# Patient Record
Sex: Female | Born: 1965 | Race: White | Hispanic: No | Marital: Married | Smoking: Never smoker
Health system: Southern US, Community
[De-identification: ages and names within clinical notes are randomized; demographics above are authoritative.]

## PROBLEM LIST (undated history)

## (undated) DIAGNOSIS — A419 Sepsis, unspecified organism: Secondary | ICD-10-CM

## (undated) DIAGNOSIS — Z8619 Personal history of other infectious and parasitic diseases: Secondary | ICD-10-CM

## (undated) DIAGNOSIS — I10 Essential (primary) hypertension: Secondary | ICD-10-CM

## (undated) DIAGNOSIS — N390 Urinary tract infection, site not specified: Secondary | ICD-10-CM

## (undated) DIAGNOSIS — E785 Hyperlipidemia, unspecified: Secondary | ICD-10-CM

## (undated) DIAGNOSIS — R600 Localized edema: Secondary | ICD-10-CM

## (undated) DIAGNOSIS — Z973 Presence of spectacles and contact lenses: Secondary | ICD-10-CM

## (undated) DIAGNOSIS — K3184 Gastroparesis: Secondary | ICD-10-CM

## (undated) DIAGNOSIS — N179 Acute kidney failure, unspecified: Secondary | ICD-10-CM

## (undated) DIAGNOSIS — N950 Postmenopausal bleeding: Secondary | ICD-10-CM

## (undated) DIAGNOSIS — R51 Headache: Secondary | ICD-10-CM

## (undated) DIAGNOSIS — F419 Anxiety disorder, unspecified: Secondary | ICD-10-CM

## (undated) DIAGNOSIS — IMO0001 Reserved for inherently not codable concepts without codable children: Secondary | ICD-10-CM

## (undated) DIAGNOSIS — N183 Chronic kidney disease, stage 3 unspecified: Secondary | ICD-10-CM

## (undated) DIAGNOSIS — N200 Calculus of kidney: Secondary | ICD-10-CM

## (undated) DIAGNOSIS — E119 Type 2 diabetes mellitus without complications: Secondary | ICD-10-CM

## (undated) DIAGNOSIS — F32A Depression, unspecified: Secondary | ICD-10-CM

## (undated) DIAGNOSIS — F329 Major depressive disorder, single episode, unspecified: Secondary | ICD-10-CM

## (undated) DIAGNOSIS — K589 Irritable bowel syndrome without diarrhea: Secondary | ICD-10-CM

## (undated) DIAGNOSIS — Z87442 Personal history of urinary calculi: Secondary | ICD-10-CM

## (undated) DIAGNOSIS — M797 Fibromyalgia: Secondary | ICD-10-CM

## (undated) DIAGNOSIS — M1990 Unspecified osteoarthritis, unspecified site: Secondary | ICD-10-CM

## (undated) DIAGNOSIS — N261 Atrophy of kidney (terminal): Secondary | ICD-10-CM

## (undated) DIAGNOSIS — J45998 Other asthma: Secondary | ICD-10-CM

## (undated) DIAGNOSIS — R519 Headache, unspecified: Secondary | ICD-10-CM

## (undated) DIAGNOSIS — D509 Iron deficiency anemia, unspecified: Secondary | ICD-10-CM

## (undated) DIAGNOSIS — R6520 Severe sepsis without septic shock: Secondary | ICD-10-CM

## (undated) HISTORY — PX: EYE SURGERY: SHX253

## (undated) HISTORY — PX: LAPAROSCOPIC CHOLECYSTECTOMY: SUR755

## (undated) HISTORY — PX: SPINAL CORD STIMULATOR IMPLANT: SHX2422

## (undated) HISTORY — PX: APPENDECTOMY: SHX54

---

## 1998-03-27 ENCOUNTER — Other Ambulatory Visit: Admission: RE | Admit: 1998-03-27 | Discharge: 1998-03-27 | Payer: Self-pay | Admitting: Gynecology

## 1999-03-13 ENCOUNTER — Emergency Department (HOSPITAL_COMMUNITY): Admission: EM | Admit: 1999-03-13 | Discharge: 1999-03-13 | Payer: Self-pay

## 1999-04-13 ENCOUNTER — Other Ambulatory Visit: Admission: RE | Admit: 1999-04-13 | Discharge: 1999-04-13 | Payer: Self-pay | Admitting: Gynecology

## 2000-03-28 ENCOUNTER — Other Ambulatory Visit: Admission: RE | Admit: 2000-03-28 | Discharge: 2000-03-28 | Payer: Self-pay | Admitting: Gynecology

## 2000-05-01 ENCOUNTER — Emergency Department (HOSPITAL_COMMUNITY): Admission: EM | Admit: 2000-05-01 | Discharge: 2000-05-01 | Payer: Self-pay | Admitting: Emergency Medicine

## 2000-05-02 ENCOUNTER — Encounter: Payer: Self-pay | Admitting: Emergency Medicine

## 2001-03-15 ENCOUNTER — Encounter: Admission: RE | Admit: 2001-03-15 | Discharge: 2001-06-13 | Payer: Self-pay | Admitting: Ophthalmology

## 2001-04-14 ENCOUNTER — Other Ambulatory Visit: Admission: RE | Admit: 2001-04-14 | Discharge: 2001-04-14 | Payer: Self-pay | Admitting: Gynecology

## 2002-05-30 ENCOUNTER — Emergency Department (HOSPITAL_COMMUNITY): Admission: EM | Admit: 2002-05-30 | Discharge: 2002-05-30 | Payer: Self-pay | Admitting: Emergency Medicine

## 2002-06-02 ENCOUNTER — Emergency Department (HOSPITAL_COMMUNITY): Admission: EM | Admit: 2002-06-02 | Discharge: 2002-06-02 | Payer: Self-pay | Admitting: Emergency Medicine

## 2002-06-06 ENCOUNTER — Emergency Department (HOSPITAL_COMMUNITY): Admission: EM | Admit: 2002-06-06 | Discharge: 2002-06-06 | Payer: Self-pay | Admitting: *Deleted

## 2002-06-13 ENCOUNTER — Emergency Department (HOSPITAL_COMMUNITY): Admission: EM | Admit: 2002-06-13 | Discharge: 2002-06-13 | Payer: Self-pay | Admitting: Emergency Medicine

## 2002-06-20 ENCOUNTER — Emergency Department (HOSPITAL_COMMUNITY): Admission: EM | Admit: 2002-06-20 | Discharge: 2002-06-20 | Payer: Self-pay | Admitting: Emergency Medicine

## 2002-07-16 ENCOUNTER — Other Ambulatory Visit: Admission: RE | Admit: 2002-07-16 | Discharge: 2002-07-16 | Payer: Self-pay | Admitting: Gynecology

## 2003-07-19 ENCOUNTER — Other Ambulatory Visit: Admission: RE | Admit: 2003-07-19 | Discharge: 2003-07-19 | Payer: Self-pay | Admitting: Gynecology

## 2004-03-06 ENCOUNTER — Encounter: Admission: RE | Admit: 2004-03-06 | Discharge: 2004-03-06 | Payer: Self-pay | Admitting: Gynecology

## 2004-08-17 ENCOUNTER — Other Ambulatory Visit: Admission: RE | Admit: 2004-08-17 | Discharge: 2004-08-17 | Payer: Self-pay | Admitting: Gynecology

## 2005-06-01 ENCOUNTER — Encounter: Admission: RE | Admit: 2005-06-01 | Discharge: 2005-07-09 | Payer: Self-pay | Admitting: Family Medicine

## 2006-01-25 ENCOUNTER — Encounter: Admission: RE | Admit: 2006-01-25 | Discharge: 2006-01-25 | Payer: Self-pay | Admitting: Gynecology

## 2006-01-25 ENCOUNTER — Other Ambulatory Visit: Admission: RE | Admit: 2006-01-25 | Discharge: 2006-01-25 | Payer: Self-pay | Admitting: Gynecology

## 2006-08-24 ENCOUNTER — Ambulatory Visit (HOSPITAL_COMMUNITY): Admission: RE | Admit: 2006-08-24 | Discharge: 2006-08-24 | Payer: Self-pay | Admitting: Gynecology

## 2007-04-05 ENCOUNTER — Encounter: Admission: RE | Admit: 2007-04-05 | Discharge: 2007-04-05 | Payer: Self-pay | Admitting: *Deleted

## 2007-10-31 ENCOUNTER — Emergency Department (HOSPITAL_COMMUNITY): Admission: EM | Admit: 2007-10-31 | Discharge: 2007-10-31 | Payer: Self-pay | Admitting: Emergency Medicine

## 2007-11-13 ENCOUNTER — Encounter
Admission: RE | Admit: 2007-11-13 | Discharge: 2007-11-13 | Payer: Self-pay | Admitting: Physical Medicine and Rehabilitation

## 2007-11-14 ENCOUNTER — Encounter
Admission: RE | Admit: 2007-11-14 | Discharge: 2007-11-14 | Payer: Self-pay | Admitting: Physical Medicine and Rehabilitation

## 2007-11-17 ENCOUNTER — Encounter
Admission: RE | Admit: 2007-11-17 | Discharge: 2007-11-17 | Payer: Self-pay | Admitting: Physical Medicine and Rehabilitation

## 2007-12-07 HISTORY — PX: COCCYX REMOVAL: SHX600

## 2008-01-25 ENCOUNTER — Encounter: Admission: RE | Admit: 2008-01-25 | Discharge: 2008-04-24 | Payer: Self-pay | Admitting: Anesthesiology

## 2008-04-08 ENCOUNTER — Encounter: Admission: RE | Admit: 2008-04-08 | Discharge: 2008-04-08 | Payer: Self-pay | Admitting: Family Medicine

## 2009-04-11 ENCOUNTER — Encounter: Admission: RE | Admit: 2009-04-11 | Discharge: 2009-04-11 | Payer: Self-pay | Admitting: Family Medicine

## 2009-06-02 ENCOUNTER — Emergency Department (HOSPITAL_BASED_OUTPATIENT_CLINIC_OR_DEPARTMENT_OTHER): Admission: EM | Admit: 2009-06-02 | Discharge: 2009-06-02 | Payer: Self-pay | Admitting: Emergency Medicine

## 2009-06-02 ENCOUNTER — Ambulatory Visit: Payer: Self-pay | Admitting: Diagnostic Radiology

## 2011-02-24 ENCOUNTER — Other Ambulatory Visit: Payer: Self-pay | Admitting: Gynecology

## 2011-02-24 DIAGNOSIS — R928 Other abnormal and inconclusive findings on diagnostic imaging of breast: Secondary | ICD-10-CM

## 2011-02-26 ENCOUNTER — Other Ambulatory Visit: Payer: Self-pay | Admitting: Gynecology

## 2011-02-26 ENCOUNTER — Ambulatory Visit
Admission: RE | Admit: 2011-02-26 | Discharge: 2011-02-26 | Disposition: A | Discharge status: Home / Self Care | Payer: BC Managed Care – PPO | Source: Ambulatory Visit | Attending: Gynecology | Admitting: Gynecology

## 2011-02-26 DIAGNOSIS — R928 Other abnormal and inconclusive findings on diagnostic imaging of breast: Secondary | ICD-10-CM

## 2011-03-15 LAB — COMPREHENSIVE METABOLIC PANEL
AST: 21 U/L (ref 0–37)
Albumin: 3.6 g/dL (ref 3.5–5.2)
Alkaline Phosphatase: 123 U/L — ABNORMAL HIGH (ref 39–117)
BUN: 4 mg/dL — ABNORMAL LOW (ref 6–23)
CO2: 28 mEq/L (ref 19–32)
Calcium: 9.1 mg/dL (ref 8.4–10.5)
Creatinine, Ser: 0.5 mg/dL (ref 0.4–1.2)
GFR calc non Af Amer: >60 mL/min (ref >60)
Glucose, Bld: 175 mg/dL — ABNORMAL HIGH (ref 70–99)
Total Protein: 7 g/dL (ref 6.0–8.3)

## 2011-03-15 LAB — DIFFERENTIAL
Basophils Absolute: 0.2 10*3/uL — ABNORMAL HIGH (ref 0.0–0.1)
Basophils Relative: 1 % (ref 0–1)
Eosinophils Relative: 0 % (ref 0–5)
Lymphocytes Relative: 10 % — ABNORMAL LOW (ref 12–46)
Lymphs Abs: 1.7 10*3/uL (ref 0.7–4.0)
Monocytes Relative: 9 % (ref 3–12)
Neutro Abs: 13.4 10*3/uL — ABNORMAL HIGH (ref 1.7–7.7)
Neutrophils Relative %: 80 % — ABNORMAL HIGH (ref 43–77)
WBC Morphology: INCREASED

## 2011-03-15 LAB — URINALYSIS, ROUTINE W REFLEX MICROSCOPIC
Glucose, UA: NEGATIVE mg/dL
Ketones, ur: NEGATIVE mg/dL
Nitrite: POSITIVE — AB
Protein, ur: 30 mg/dL — AB
Urobilinogen, UA: 0.2 mg/dL (ref 0.0–1.0)
pH: 5 (ref 5.0–8.0)

## 2011-03-15 LAB — URINE MICROSCOPIC-ADD ON

## 2011-03-15 LAB — CBC: Hemoglobin: 12 g/dL (ref 12.0–15.0)

## 2011-09-14 LAB — POCT CARDIAC MARKERS
CKMB, poc: 2
CKMB, poc: <1 — ABNORMAL LOW
Myoglobin, poc: 40.9
Troponin i, poc: <0.05
Troponin i, poc: <0.05

## 2011-09-14 LAB — I-STAT 8, (EC8 V) (CONVERTED LAB)
BUN: 12
Hemoglobin: 14.6
Potassium: 4
TCO2: 31
pH, Ven: 7.364 — ABNORMAL HIGH

## 2011-09-14 LAB — CBC
Hemoglobin: 13.6
MCV: 81.8

## 2011-09-14 LAB — B-NATRIURETIC PEPTIDE (CONVERTED LAB): Pro B Natriuretic peptide (BNP): <30

## 2011-09-14 LAB — DIFFERENTIAL
Basophils Absolute: 0.1
Lymphs Abs: 2.8
Monocytes Relative: 7

## 2011-09-14 LAB — POCT I-STAT CREATININE
Creatinine, Ser: 0.6
Operator id: 198171

## 2013-01-08 DIAGNOSIS — M5417 Radiculopathy, lumbosacral region: Secondary | ICD-10-CM | POA: Insufficient documentation

## 2013-01-08 DIAGNOSIS — M533 Sacrococcygeal disorders, not elsewhere classified: Secondary | ICD-10-CM | POA: Insufficient documentation

## 2013-04-06 ENCOUNTER — Encounter (HOSPITAL_COMMUNITY): Payer: Self-pay | Admitting: Pharmacist

## 2013-04-16 ENCOUNTER — Encounter (HOSPITAL_COMMUNITY)
Admission: RE | Admit: 2013-04-16 | Discharge: 2013-04-16 | Disposition: A | Discharge status: Home / Self Care | Payer: Medicare Other | Source: Ambulatory Visit | Attending: Obstetrics and Gynecology | Admitting: Obstetrics and Gynecology

## 2013-04-16 ENCOUNTER — Encounter (HOSPITAL_COMMUNITY): Payer: Self-pay

## 2013-04-16 HISTORY — DX: Anxiety disorder, unspecified: F41.9

## 2013-04-16 HISTORY — DX: Fibromyalgia: M79.7

## 2013-04-16 HISTORY — DX: Essential (primary) hypertension: I10

## 2013-04-16 HISTORY — DX: Depression, unspecified: F32.A

## 2013-04-16 HISTORY — DX: Major depressive disorder, single episode, unspecified: F32.9

## 2013-04-16 LAB — CBC
HCT: 38.8 % (ref 36.0–46.0)
Hemoglobin: 12.7 g/dL (ref 12.0–15.0)
MCH: 26.8 pg (ref 26.0–34.0)
MCHC: 32.7 g/dL (ref 30.0–36.0)
MCV: 82 fL (ref 78.0–100.0)
RBC: 4.73 MIL/uL (ref 3.87–5.11)

## 2013-04-16 LAB — BASIC METABOLIC PANEL
BUN: 10 mg/dL (ref 6–23)
CO2: 34 mEq/L — ABNORMAL HIGH (ref 19–32)
Chloride: 95 mEq/L — ABNORMAL LOW (ref 96–112)
GFR calc non Af Amer: >90 mL/min (ref >90)
Glucose, Bld: 108 mg/dL — ABNORMAL HIGH (ref 70–99)
Potassium: 3.2 mEq/L — ABNORMAL LOW (ref 3.5–5.1)
Sodium: 139 mEq/L (ref 135–145)

## 2013-04-16 NOTE — H&P (Addendum)
47 yo with irregular menses and endometrial polyp presents for surgical mngt  PMHx: arthritis, HTN, DM, asthma, HA, IBS PSx:  Chole, open appy, tailbone sx Meds:  See list All:  None FHX:  Mom - breast, brother brain ca SHx:  Negative  Af, vss Gen - NAD CV - RRR Lungs - clear Abd - soft ND/NT PV - uterus soft, NT, no adnexal masses  [place]:  Hydrosalpinx,  Fibroids, 12mm intracavitary mass  A/P:  Irregular meses & endometrial mass Hysteroscopy, D&C Plan of care discussed, informed consent

## 2013-04-16 NOTE — Patient Instructions (Addendum)
Your procedure is scheduled on:04/20/13  Enter through the Main Entrance at :6am Pick up desk phone and dial [phone] and inform us of your arrival.  Please call [phone] if you have any problems the morning of surgery.  Remember: Do not eat or drink after midnight:Thursday   Take these meds the morning of surgery with a sip of water:BP meds  DO NOT wear jewelry, eye make-up, lipstick,body lotion, or dark fingernail polish.  Patients discharged on the day of surgery will not be allowed to drive home.

## 2013-04-19 MED ORDER — CEFOTETAN DISODIUM 2 G IJ SOLR
2.0000 g | INTRAMUSCULAR | Status: AC
  Administered 2013-04-20: 2 g via INTRAVENOUS
  Filled 2013-04-19: qty 2

## 2013-04-20 ENCOUNTER — Encounter (HOSPITAL_COMMUNITY)
Admission: RE | Disposition: A | Discharge status: Home / Self Care | Payer: Self-pay | Source: Ambulatory Visit | Attending: Obstetrics and Gynecology

## 2013-04-20 ENCOUNTER — Encounter (HOSPITAL_COMMUNITY): Payer: Self-pay | Admitting: Anesthesiology

## 2013-04-20 ENCOUNTER — Ambulatory Visit (HOSPITAL_COMMUNITY)
Admission: RE | Admit: 2013-04-20 | Discharge: 2013-04-20 | Disposition: A | Discharge status: Home / Self Care | Payer: Medicare Other | Source: Ambulatory Visit | Attending: Obstetrics and Gynecology | Admitting: Obstetrics and Gynecology

## 2013-04-20 ENCOUNTER — Encounter (HOSPITAL_COMMUNITY): Payer: Self-pay

## 2013-04-20 ENCOUNTER — Ambulatory Visit (HOSPITAL_COMMUNITY): Payer: Medicare Other

## 2013-04-20 DIAGNOSIS — N7013 Chronic salpingitis and oophoritis: Secondary | ICD-10-CM | POA: Insufficient documentation

## 2013-04-20 DIAGNOSIS — N926 Irregular menstruation, unspecified: Secondary | ICD-10-CM | POA: Insufficient documentation

## 2013-04-20 DIAGNOSIS — N840 Polyp of corpus uteri: Secondary | ICD-10-CM | POA: Insufficient documentation

## 2013-04-20 DIAGNOSIS — E119 Type 2 diabetes mellitus without complications: Secondary | ICD-10-CM | POA: Insufficient documentation

## 2013-04-20 DIAGNOSIS — I10 Essential (primary) hypertension: Secondary | ICD-10-CM | POA: Insufficient documentation

## 2013-04-20 HISTORY — PX: HYSTEROSCOPY WITH D & C: SHX1775

## 2013-04-20 HISTORY — PX: POLYPECTOMY: SHX5525

## 2013-04-20 LAB — GLUCOSE, CAPILLARY: Glucose-Capillary: 153 mg/dL — ABNORMAL HIGH (ref 70–99)

## 2013-04-20 SURGERY — DILATATION AND CURETTAGE /HYSTEROSCOPY
Anesthesia: General | Site: Vagina | Wound class: Clean Contaminated

## 2013-04-20 MED ORDER — PROPOFOL 10 MG/ML IV EMUL
INTRAVENOUS | Status: AC
  Filled 2013-04-20: qty 20

## 2013-04-20 MED ORDER — FENTANYL CITRATE 0.05 MG/ML IJ SOLN
INTRAMUSCULAR | Status: DC | PRN
  Administered 2013-04-20: 100 ug via INTRAVENOUS
  Administered 2013-04-20 (×2): 50 ug via INTRAVENOUS

## 2013-04-20 MED ORDER — MIDAZOLAM HCL 2 MG/2ML IJ SOLN
INTRAMUSCULAR | Status: AC
  Filled 2013-04-20: qty 2

## 2013-04-20 MED ORDER — LACTATED RINGERS IV SOLN
INTRAVENOUS | Status: DC
  Administered 2013-04-20: 50 mL/h via INTRAVENOUS

## 2013-04-20 MED ORDER — FENTANYL CITRATE 0.05 MG/ML IJ SOLN
INTRAMUSCULAR | Status: AC
  Administered 2013-04-20: 50 ug via INTRAVENOUS
  Filled 2013-04-20: qty 2

## 2013-04-20 MED ORDER — FENTANYL CITRATE 0.05 MG/ML IJ SOLN
INTRAMUSCULAR | Status: AC
  Filled 2013-04-20: qty 2

## 2013-04-20 MED ORDER — LIDOCAINE HCL 1 % IJ SOLN
INTRAMUSCULAR | Status: DC | PRN
  Administered 2013-04-20: 8 mL

## 2013-04-20 MED ORDER — PROPOFOL 10 MG/ML IV BOLUS
INTRAVENOUS | Status: DC | PRN
  Administered 2013-04-20: 40 mg via INTRAVENOUS
  Administered 2013-04-20: 250 mg via INTRAVENOUS

## 2013-04-20 MED ORDER — ONDANSETRON HCL 4 MG/2ML IJ SOLN
INTRAMUSCULAR | Status: AC
  Filled 2013-04-20: qty 2

## 2013-04-20 MED ORDER — MIDAZOLAM HCL 5 MG/5ML IJ SOLN
INTRAMUSCULAR | Status: DC | PRN
  Administered 2013-04-20: 2 mg via INTRAVENOUS

## 2013-04-20 MED ORDER — KETOROLAC TROMETHAMINE 30 MG/ML IJ SOLN
INTRAMUSCULAR | Status: AC
  Filled 2013-04-20: qty 1

## 2013-04-20 MED ORDER — FENTANYL CITRATE 0.05 MG/ML IJ SOLN
25.0000 ug | INTRAMUSCULAR | Status: DC | PRN
  Administered 2013-04-20 (×2): 50 ug via INTRAVENOUS

## 2013-04-20 MED ORDER — GLYCOPYRROLATE 0.2 MG/ML IJ SOLN
INTRAMUSCULAR | Status: AC
  Filled 2013-04-20: qty 1

## 2013-04-20 MED ORDER — ONDANSETRON HCL 4 MG/2ML IJ SOLN
INTRAMUSCULAR | Status: DC | PRN
  Administered 2013-04-20: 4 mg via INTRAVENOUS

## 2013-04-20 MED ORDER — LIDOCAINE HCL (CARDIAC) 20 MG/ML IV SOLN
INTRAVENOUS | Status: AC
  Filled 2013-04-20: qty 5

## 2013-04-20 MED ORDER — GLYCOPYRROLATE 0.2 MG/ML IJ SOLN
INTRAMUSCULAR | Status: DC | PRN
  Administered 2013-04-20: 0.1 mg via INTRAVENOUS

## 2013-04-20 MED ORDER — DEXAMETHASONE SODIUM PHOSPHATE 4 MG/ML IJ SOLN
INTRAMUSCULAR | Status: DC | PRN
  Administered 2013-04-20: 10 mg via INTRAVENOUS

## 2013-04-20 MED ORDER — DEXAMETHASONE SODIUM PHOSPHATE 10 MG/ML IJ SOLN
INTRAMUSCULAR | Status: AC
  Filled 2013-04-20: qty 1

## 2013-04-20 MED ORDER — LIDOCAINE HCL (CARDIAC) 20 MG/ML IV SOLN
INTRAVENOUS | Status: DC | PRN
  Administered 2013-04-20: 80 mg via INTRAVENOUS

## 2013-04-20 MED ORDER — GLYCINE 1.5 % IR SOLN
Status: DC | PRN
  Administered 2013-04-20: 3000 mL

## 2013-04-20 MED ORDER — ACETAMINOPHEN 10 MG/ML IV SOLN
INTRAVENOUS | Status: AC
  Administered 2013-04-20: 1000 mg via INTRAVENOUS
  Filled 2013-04-20: qty 100

## 2013-04-20 MED ORDER — ALBUTEROL SULFATE HFA 108 (90 BASE) MCG/ACT IN AERS
INHALATION_SPRAY | RESPIRATORY_TRACT | Status: AC
  Filled 2013-04-20: qty 6.7

## 2013-04-20 MED ORDER — KETOROLAC TROMETHAMINE 30 MG/ML IJ SOLN
INTRAMUSCULAR | Status: DC | PRN
  Administered 2013-04-20: 30 mg via INTRAVENOUS

## 2013-04-20 SURGICAL SUPPLY — 18 items
ABLATOR ENDOMETRIAL BIPOLAR (ABLATOR) IMPLANT
CANISTER SUCTION 2500CC (MISCELLANEOUS) ×2 IMPLANT
CATH ROBINSON RED A/P 16FR (CATHETERS) ×2 IMPLANT
CATH THERMACHOICE III (CATHETERS) IMPLANT
CLOTH BEACON ORANGE TIMEOUT ST (SAFETY) ×2 IMPLANT
CONTAINER PREFILL 10% NBF 60ML (FORM) ×4 IMPLANT
DRESSING TELFA 8X3 (GAUZE/BANDAGES/DRESSINGS) ×3 IMPLANT
ELECT REM PT RETURN 9FT ADLT (ELECTROSURGICAL)
ELECTRODE REM PT RTRN 9FT ADLT (ELECTROSURGICAL) ×1 IMPLANT
GLOVE BIO SURGEON STRL SZ 6.5 (GLOVE) ×2 IMPLANT
GLOVE BIOGEL PI IND STRL 7.0 (GLOVE) ×1 IMPLANT
GLOVE BIOGEL PI INDICATOR 7.0 (GLOVE) ×1
GOWN STRL REIN XL XLG (GOWN DISPOSABLE) ×4 IMPLANT
LOOP ANGLED CUTTING 22FR (CUTTING LOOP) IMPLANT
PACK HYSTEROSCOPY LF (CUSTOM PROCEDURE TRAY) ×2 IMPLANT
PAD OB MATERNITY 4.3X12.25 (PERSONAL CARE ITEMS) ×2 IMPLANT
TOWEL OR 17X24 6PK STRL BLUE (TOWEL DISPOSABLE) ×4 IMPLANT
WATER STERILE IRR 1000ML POUR (IV SOLUTION) ×2 IMPLANT

## 2013-04-20 NOTE — Anesthesia Procedure Notes (Signed)
Procedures

## 2013-04-20 NOTE — Op Note (Signed)
Grace Singh, Grace Singh              ACCOUNT NO.:  [id number]  MEDICAL RECORD NO.:  [id number]  LOCATION:  [facility]                          FACILITY:  [facility]  PHYSICIAN:  Zelphia Cairo, MD    DATE OF BIRTH:  January 09, 1966  DATE OF PROCEDURE:  04/20/2013 DATE OF DISCHARGE:  04/20/2013                              OPERATIVE REPORT   PREOPERATIVE DIAGNOSIS:  Irregular vaginal bleeding.  POSTOPERATIVE DIAGNOSIS:  Irregular vaginal bleeding, pathology pending.  PROCEDURES: 1. Cervical block. 2. Hysteroscopy. 3. D and C. 4. Polypectomy.  SURGEON:  Zelphia Cairo, MD  ASSISTANT:  None.  ANESTHESIA:  General.  SPECIMEN:  Endometrial curettings with polyp.  FLUID DEFICIT:  160 mL.  COMPLICATIONS:  None.  CONDITION:  Stable to recovery room.  PROCEDURE:  The patient was taken to the operating room.  After informed consent was obtained, she was given general anesthesia and placed in the dorsal lithotomy position using Allen stirrups.  She was prepped and draped in sterile fashion and an in-and-out catheter was used to drain her bladder for an unmeasured amount of urine.  Bivalve speculum was then placed in the vagina and 1 mL of 1% lidocaine was injected at 12 o'clock on the cervix.  A single-tooth tenaculum was attached to the anterior lip of the cervix, 7 mL was then used to perform a cervical block.  The cervix was then serially dilated using Pratt dilators.  She did have minimal cervical stenosis noted at the internal os.  The diagnostic hysteroscope was then inserted under direct visualization. Bilateral ostia appeared normal.  There did appear to be a uterine synechiae in the left horn of the uterus.  Polyp was noted on the anterior wall of the uterus near the right.  Hysteroscope was then removed and a gentle curetting was performed.  Hysteroscope was reinserted and the base of the polyp was still noted to be within the endometrial cavity.  Hysteroscopic biopsy forceps were  inserted and the base of the polyp was grasped under direct visualization and removed. Another curetting was then performed.  All clots and debris free were removed.  Hysteroscope was reinserted one last time and no other masses were noted.  Hysteroscope was removed.  The tenaculum was removed and the cervix was hemostatic. Speculum was removed.  The patient tolerated the procedure well. Sponge, lap, needle, and instrument counts were correct x2.     Zelphia Cairo, MD     GA/MEDQ  D:  04/20/2013  T:  04/20/2013  Job:  [id number]

## 2013-04-20 NOTE — Transfer of Care (Signed)
Immediate Anesthesia Transfer of Care Note  Patient: Grace Singh  Procedure(s) Performed: Procedure(s): DILATATION AND CURETTAGE /HYSTEROSCOPY (N/A) POLYPECTOMY (N/A)  Patient Location: PACU  Anesthesia Type:General  Level of Consciousness: awake, alert  and oriented  Airway & Oxygen Therapy: Patient Spontanous Breathing and Patient connected to nasal cannula oxygen  Post-op Assessment: Report given to PACU RN and Post -op Vital signs reviewed and stable  Post vital signs: Reviewed and stable  Complications: No apparent anesthesia complications

## 2013-04-20 NOTE — Anesthesia Postprocedure Evaluation (Signed)
  Anesthesia Post-op Note  Patient: Grace Singh  Procedure(s) Performed: Procedure(s): DILATATION AND CURETTAGE /HYSTEROSCOPY (N/A) POLYPECTOMY (N/A)  Patient Location: PACU  Anesthesia Type:General  Level of Consciousness: awake, alert  and oriented  Airway and Oxygen Therapy: Patient Spontanous Breathing  Post-op Pain: mild  Post-op Assessment: Post-op Vital signs reviewed, Patient's Cardiovascular Status Stable, Respiratory Function Stable, Patent Airway, No signs of Nausea or vomiting and Pain level controlled  Post-op Vital Signs: Reviewed and stable  Complications: No apparent anesthesia complications

## 2013-04-20 NOTE — Anesthesia Preprocedure Evaluation (Signed)
Anesthesia Evaluation  Patient identified by MRN, date of birth, ID band Patient awake    Reviewed: Allergy & Precautions, H&P , NPO status , Patient's Chart, lab work & pertinent test results, reviewed documented beta blocker date and time   History of Anesthesia Complications Negative for: history of anesthetic complications  Airway Mallampati: II TM Distance: >3 FB Neck ROM: full    Dental  (+) Teeth Intact   Pulmonary asthma (last inhaler use yesterday, seasonal - has been coughing since Wednesday - feels it is allergy related) ,  breath sounds clear to auscultation  Pulmonary exam normal       Cardiovascular Exercise Tolerance: Good hypertension, Rhythm:regular Rate:Normal     Neuro/Psych PSYCHIATRIC DISORDERS (depression, anxiety) Back pain/pain from removal of tailbone  Neuromuscular disease    GI/Hepatic negative GI ROS, Neg liver ROS,   Endo/Other  diabetes, Type 2, Oral Hypoglycemic AgentsMorbid obesity  Renal/GU Renal disease (stage 3 kidney disease)  Female GU complaint     Musculoskeletal  (+) Fibromyalgia -  Abdominal (+) + obese,   Peds  Hematology negative hematology ROS (+) anemia ,   Anesthesia Other Findings   Reproductive/Obstetrics negative OB ROS                           Anesthesia Physical Anesthesia Plan  ASA: III  Anesthesia Plan: General LMA   Post-op Pain Management:    Induction: Intravenous  Airway Management Planned: LMA  Additional Equipment:   Intra-op Plan:   Post-operative Plan: Extubation in OR  Informed Consent: I have reviewed the patients History and Physical, chart, labs and discussed the procedure including the risks, benefits and alternatives for the proposed anesthesia with the patient or authorized representative who has indicated his/her understanding and acceptance.   Dental Advisory Given  Plan Discussed with: CRNA,  Anesthesiologist and Surgeon  Anesthesia Plan Comments:         Anesthesia Quick Evaluation

## 2013-04-23 ENCOUNTER — Encounter (HOSPITAL_COMMUNITY): Payer: Self-pay | Admitting: Obstetrics and Gynecology

## 2013-04-23 LAB — GLUCOSE, CAPILLARY

## 2013-05-11 ENCOUNTER — Encounter (HOSPITAL_COMMUNITY)
Admission: EM | Disposition: A | Discharge status: Home / Self Care | Payer: Self-pay | Source: Home / Self Care | Attending: Urology

## 2013-05-11 ENCOUNTER — Encounter (HOSPITAL_BASED_OUTPATIENT_CLINIC_OR_DEPARTMENT_OTHER): Payer: Self-pay | Admitting: *Deleted

## 2013-05-11 ENCOUNTER — Emergency Department (HOSPITAL_BASED_OUTPATIENT_CLINIC_OR_DEPARTMENT_OTHER): Payer: Medicare Other

## 2013-05-11 ENCOUNTER — Encounter (HOSPITAL_COMMUNITY): Payer: Self-pay | Admitting: *Deleted

## 2013-05-11 ENCOUNTER — Inpatient Hospital Stay (HOSPITAL_BASED_OUTPATIENT_CLINIC_OR_DEPARTMENT_OTHER)
Admission: EM | Admit: 2013-05-11 | Discharge: 2013-05-13 | DRG: 693 | Disposition: A | Discharge status: Home / Self Care | Payer: Medicare Other | Attending: Urology | Admitting: Urology

## 2013-05-11 ENCOUNTER — Emergency Department (HOSPITAL_COMMUNITY): Payer: Medicare Other | Admitting: *Deleted

## 2013-05-11 DIAGNOSIS — E119 Type 2 diabetes mellitus without complications: Secondary | ICD-10-CM | POA: Diagnosis present

## 2013-05-11 DIAGNOSIS — Z6841 Body Mass Index (BMI) 40.0 and over, adult: Secondary | ICD-10-CM

## 2013-05-11 DIAGNOSIS — I129 Hypertensive chronic kidney disease with stage 1 through stage 4 chronic kidney disease, or unspecified chronic kidney disease: Secondary | ICD-10-CM | POA: Diagnosis present

## 2013-05-11 DIAGNOSIS — E876 Hypokalemia: Secondary | ICD-10-CM | POA: Diagnosis not present

## 2013-05-11 DIAGNOSIS — A4159 Other Gram-negative sepsis: Secondary | ICD-10-CM | POA: Diagnosis present

## 2013-05-11 DIAGNOSIS — F411 Generalized anxiety disorder: Secondary | ICD-10-CM | POA: Diagnosis present

## 2013-05-11 DIAGNOSIS — N183 Chronic kidney disease, stage 3 unspecified: Secondary | ICD-10-CM | POA: Diagnosis present

## 2013-05-11 DIAGNOSIS — N134 Hydroureter: Secondary | ICD-10-CM | POA: Diagnosis present

## 2013-05-11 DIAGNOSIS — Z79899 Other long term (current) drug therapy: Secondary | ICD-10-CM

## 2013-05-11 DIAGNOSIS — R6521 Severe sepsis with septic shock: Secondary | ICD-10-CM | POA: Diagnosis not present

## 2013-05-11 DIAGNOSIS — N201 Calculus of ureter: Principal | ICD-10-CM | POA: Diagnosis present

## 2013-05-11 DIAGNOSIS — F329 Major depressive disorder, single episode, unspecified: Secondary | ICD-10-CM | POA: Diagnosis present

## 2013-05-11 DIAGNOSIS — A419 Sepsis, unspecified organism: Secondary | ICD-10-CM

## 2013-05-11 DIAGNOSIS — N135 Crossing vessel and stricture of ureter without hydronephrosis: Secondary | ICD-10-CM

## 2013-05-11 DIAGNOSIS — N10 Acute tubulo-interstitial nephritis: Secondary | ICD-10-CM

## 2013-05-11 DIAGNOSIS — F3289 Other specified depressive episodes: Secondary | ICD-10-CM | POA: Diagnosis present

## 2013-05-11 DIAGNOSIS — R6520 Severe sepsis without septic shock: Secondary | ICD-10-CM | POA: Diagnosis present

## 2013-05-11 DIAGNOSIS — N179 Acute kidney failure, unspecified: Secondary | ICD-10-CM | POA: Diagnosis present

## 2013-05-11 DIAGNOSIS — IMO0001 Reserved for inherently not codable concepts without codable children: Secondary | ICD-10-CM | POA: Diagnosis present

## 2013-05-11 HISTORY — PX: CYSTOSCOPY WITH STENT PLACEMENT: SHX5790

## 2013-05-11 LAB — CBC WITH DIFFERENTIAL/PLATELET
Basophils Absolute: 0.1 10*3/uL (ref 0.0–0.1)
Eosinophils Absolute: 0.2 10*3/uL (ref 0.0–0.7)
Lymphocytes Relative: 10 % — ABNORMAL LOW (ref 12–46)
Lymphs Abs: 1.2 10*3/uL (ref 0.7–4.0)
MCH: 27.8 pg (ref 26.0–34.0)
Neutrophils Relative %: 88 % — ABNORMAL HIGH (ref 43–77)
Platelets: 238 10*3/uL (ref 150–400)
RBC: 4.93 MIL/uL (ref 3.87–5.11)
RDW: 16 % — ABNORMAL HIGH (ref 11.5–15.5)
WBC: 11.9 10*3/uL — ABNORMAL HIGH (ref 4.0–10.5)

## 2013-05-11 LAB — LACTIC ACID, PLASMA: Lactic Acid, Venous: 3.2 mmol/L — ABNORMAL HIGH (ref 0.5–2.2)

## 2013-05-11 LAB — URINALYSIS, ROUTINE W REFLEX MICROSCOPIC
Nitrite: POSITIVE — AB
Specific Gravity, Urine: 1.007 (ref 1.005–1.030)
Urobilinogen, UA: 0.2 mg/dL (ref 0.0–1.0)

## 2013-05-11 LAB — PREGNANCY, URINE: Preg Test, Ur: NEGATIVE

## 2013-05-11 LAB — URINE MICROSCOPIC-ADD ON

## 2013-05-11 LAB — GLUCOSE, CAPILLARY
Glucose-Capillary: 161 mg/dL — ABNORMAL HIGH (ref 70–99)
Glucose-Capillary: 144 mg/dL — ABNORMAL HIGH (ref 70–99)
Glucose-Capillary: 145 mg/dL — ABNORMAL HIGH (ref 70–99)

## 2013-05-11 LAB — PROCALCITONIN: Procalcitonin: 51.74 ng/mL

## 2013-05-11 LAB — BASIC METABOLIC PANEL
CO2: 23 mEq/L (ref 19–32)
Chloride: 99 mEq/L (ref 96–112)
Creatinine, Ser: 1.1 mg/dL (ref 0.50–1.10)
Sodium: 138 mEq/L (ref 135–145)

## 2013-05-11 SURGERY — CYSTOSCOPY, WITH STENT INSERTION
Anesthesia: General | Laterality: Bilateral | Wound class: Dirty or Infected

## 2013-05-11 MED ORDER — DEXTROSE 5 % IV SOLN
1.0000 g | Freq: Once | INTRAVENOUS | Status: AC
  Administered 2013-05-11: 1 g via INTRAVENOUS
  Filled 2013-05-11: qty 10

## 2013-05-11 MED ORDER — HYDROMORPHONE HCL PF 1 MG/ML IJ SOLN
0.5000 mg | Freq: Once | INTRAMUSCULAR | Status: AC
Start: 2013-05-11 — End: 2013-05-11
  Administered 2013-05-11: 0.5 mg via INTRAVENOUS
  Filled 2013-05-11: qty 1

## 2013-05-11 MED ORDER — LACTATED RINGERS IV SOLN
INTRAVENOUS | Status: DC
  Administered 2013-05-11: 1000 mL via INTRAVENOUS

## 2013-05-11 MED ORDER — ATORVASTATIN CALCIUM 40 MG PO TABS
40.0000 mg | ORAL_TABLET | Freq: Every day | ORAL | Status: DC
  Filled 2013-05-11: qty 1

## 2013-05-11 MED ORDER — BUPROPION HCL 75 MG PO TABS
75.0000 mg | ORAL_TABLET | Freq: Two times a day (BID) | ORAL | Status: DC
  Filled 2013-05-11 (×2): qty 1

## 2013-05-11 MED ORDER — LEVOFLOXACIN IN D5W 750 MG/150ML IV SOLN
750.0000 mg | Freq: Once | INTRAVENOUS | Status: AC
  Administered 2013-05-11: 750 mg via INTRAVENOUS
  Filled 2013-05-11: qty 150

## 2013-05-11 MED ORDER — DEXTROSE 5 % IV SOLN
1.0000 g | INTRAVENOUS | Status: DC
  Filled 2013-05-11 (×2): qty 10

## 2013-05-11 MED ORDER — LIDOCAINE HCL (CARDIAC) 20 MG/ML IV SOLN
INTRAVENOUS | Status: DC | PRN
  Administered 2013-05-11: 75 mg via INTRAVENOUS

## 2013-05-11 MED ORDER — PHENYLEPHRINE HCL 10 MG/ML IJ SOLN
30.0000 ug/min | INTRAVENOUS | Status: DC
  Administered 2013-05-11: 50 ug/min via INTRAVENOUS
  Filled 2013-05-11: qty 1

## 2013-05-11 MED ORDER — METOCLOPRAMIDE HCL 5 MG/ML IJ SOLN
INTRAMUSCULAR | Status: DC | PRN
  Administered 2013-05-11: 5 mg via INTRAVENOUS

## 2013-05-11 MED ORDER — ONDANSETRON 8 MG PO TBDP
ORAL_TABLET | ORAL | Status: AC
  Filled 2013-05-11: qty 1

## 2013-05-11 MED ORDER — ACETAMINOPHEN 325 MG PO TABS
650.0000 mg | ORAL_TABLET | Freq: Four times a day (QID) | ORAL | Status: DC | PRN
  Administered 2013-05-11 – 2013-05-13 (×5): 650 mg via ORAL
  Filled 2013-05-11 (×4): qty 2
  Filled 2013-05-11: qty 1

## 2013-05-11 MED ORDER — LAMOTRIGINE 100 MG PO TABS
100.0000 mg | ORAL_TABLET | Freq: Every day | ORAL | Status: DC
  Filled 2013-05-11: qty 1

## 2013-05-11 MED ORDER — HYDROMORPHONE HCL PF 2 MG/ML IJ SOLN
2.0000 mg | Freq: Once | INTRAMUSCULAR | Status: AC
  Administered 2013-05-11: 2 mg via INTRAMUSCULAR

## 2013-05-11 MED ORDER — ONDANSETRON HCL 4 MG/2ML IJ SOLN
4.0000 mg | INTRAMUSCULAR | Status: DC | PRN
  Administered 2013-05-12: 4 mg via INTRAVENOUS
  Filled 2013-05-11: qty 2

## 2013-05-11 MED ORDER — CIPROFLOXACIN IN D5W 400 MG/200ML IV SOLN
400.0000 mg | Freq: Two times a day (BID) | INTRAVENOUS | Status: DC
Start: 2013-05-11 — End: 2013-05-13
  Administered 2013-05-11 – 2013-05-13 (×4): 400 mg via INTRAVENOUS
  Filled 2013-05-11 (×6): qty 200

## 2013-05-11 MED ORDER — SODIUM CHLORIDE 0.9 % IV SOLN
INTRAVENOUS | Status: DC
  Administered 2013-05-11: 04:00:00 via INTRAVENOUS

## 2013-05-11 MED ORDER — PHENYLEPHRINE HCL 10 MG/ML IJ SOLN
30.0000 ug/min | INTRAVENOUS | Status: DC
  Administered 2013-05-11: 40 ug/min via INTRAVENOUS
  Filled 2013-05-11 (×2): qty 1

## 2013-05-11 MED ORDER — METOCLOPRAMIDE HCL 5 MG/ML IJ SOLN
INTRAMUSCULAR | Status: AC
  Administered 2013-05-11: 10 mg via INTRAVENOUS
  Filled 2013-05-11: qty 2

## 2013-05-11 MED ORDER — PHENYLEPHRINE HCL 10 MG/ML IJ SOLN
INTRAMUSCULAR | Status: DC | PRN
  Administered 2013-05-11 (×2): 40 ug via INTRAVENOUS

## 2013-05-11 MED ORDER — PROMETHAZINE HCL 25 MG/ML IJ SOLN: 6.2500 mg | INTRAMUSCULAR | Status: DC | PRN

## 2013-05-11 MED ORDER — SODIUM CHLORIDE 0.9 % IV BOLUS (SEPSIS)
1000.0000 mL | Freq: Once | INTRAVENOUS | Status: AC
  Administered 2013-05-11: 1000 mL via INTRAVENOUS

## 2013-05-11 MED ORDER — LAMOTRIGINE 100 MG PO TABS
200.0000 mg | ORAL_TABLET | Freq: Every morning | ORAL | Status: DC
  Filled 2013-05-11: qty 2

## 2013-05-11 MED ORDER — SODIUM CHLORIDE 0.9 % IV SOLN
1.0000 g | INTRAVENOUS | Status: DC | PRN
  Administered 2013-05-11: 1 g via INTRAVENOUS

## 2013-05-11 MED ORDER — ONDANSETRON HCL 4 MG/2ML IJ SOLN
INTRAMUSCULAR | Status: DC | PRN
  Administered 2013-05-11: 4 mg via INTRAVENOUS

## 2013-05-11 MED ORDER — ACETAMINOPHEN 10 MG/ML IV SOLN
INTRAVENOUS | Status: DC | PRN
  Administered 2013-05-11: 1000 mg via INTRAVENOUS

## 2013-05-11 MED ORDER — PROPOFOL 10 MG/ML IV BOLUS
INTRAVENOUS | Status: DC | PRN
  Administered 2013-05-11: 250 mg via INTRAVENOUS

## 2013-05-11 MED ORDER — POTASSIUM CHLORIDE IN NACL 20-0.9 MEQ/L-% IV SOLN
INTRAVENOUS | Status: DC
  Administered 2013-05-11: 125 mL/h via INTRAVENOUS
  Administered 2013-05-11 – 2013-05-12 (×2): 125 mL via INTRAVENOUS
  Filled 2013-05-11 (×3): qty 1000

## 2013-05-11 MED ORDER — HYDROMORPHONE HCL PF 1 MG/ML IJ SOLN
1.0000 mg | Freq: Once | INTRAMUSCULAR | Status: AC
  Administered 2013-05-11: 1 mg via INTRAVENOUS
  Filled 2013-05-11: qty 1

## 2013-05-11 MED ORDER — HYDROMORPHONE HCL PF 1 MG/ML IJ SOLN
0.5000 mg | INTRAMUSCULAR | Status: DC | PRN
  Administered 2013-05-11 – 2013-05-12 (×2): 1 mg via INTRAVENOUS
  Filled 2013-05-11 (×2): qty 1

## 2013-05-11 MED ORDER — ONDANSETRON 8 MG PO TBDP
8.0000 mg | ORAL_TABLET | Freq: Once | ORAL | Status: AC
  Administered 2013-05-11: 8 mg via ORAL

## 2013-05-11 MED ORDER — INSULIN ASPART 100 UNIT/ML ~~LOC~~ SOLN
0.0000 [IU] | SUBCUTANEOUS | Status: DC
  Administered 2013-05-11: 3 [IU] via SUBCUTANEOUS
  Administered 2013-05-11: 1 [IU] via SUBCUTANEOUS
  Administered 2013-05-11 – 2013-05-12 (×2): 2 [IU] via SUBCUTANEOUS

## 2013-05-11 MED ORDER — SENNA 8.6 MG PO TABS: 1.0000 | ORAL_TABLET | Freq: Two times a day (BID) | ORAL | Status: DC

## 2013-05-11 MED ORDER — ACETAMINOPHEN 650 MG RE SUPP
650.0000 mg | Freq: Once | RECTAL | Status: AC
  Administered 2013-05-11: 650 mg via RECTAL
  Filled 2013-05-11: qty 1

## 2013-05-11 MED ORDER — SODIUM CHLORIDE 0.9 % IV SOLN
INTRAVENOUS | Status: DC | PRN
  Administered 2013-05-11: 08:00:00 via INTRAVENOUS

## 2013-05-11 MED ORDER — FENTANYL CITRATE 0.05 MG/ML IJ SOLN: 25.0000 ug | INTRAMUSCULAR | Status: DC | PRN

## 2013-05-11 MED ORDER — SODIUM CHLORIDE 0.9 % IV BOLUS (SEPSIS)
500.0000 mL | Freq: Once | INTRAVENOUS | Status: AC
  Administered 2013-05-11: 500 mL via INTRAVENOUS

## 2013-05-11 MED ORDER — LACTATED RINGERS IV SOLN
INTRAVENOUS | Status: DC | PRN
  Administered 2013-05-11 (×2): via INTRAVENOUS

## 2013-05-11 MED ORDER — LORAZEPAM 0.5 MG PO TABS: 0.5000 mg | ORAL_TABLET | Freq: Three times a day (TID) | ORAL | Status: DC | PRN

## 2013-05-11 MED ORDER — KETOROLAC TROMETHAMINE 30 MG/ML IJ SOLN
INTRAMUSCULAR | Status: AC
  Filled 2013-05-11: qty 1

## 2013-05-11 MED ORDER — OXYCODONE-ACETAMINOPHEN 5-325 MG PO TABS: 1.0000 | ORAL_TABLET | ORAL | Status: DC | PRN

## 2013-05-11 MED ORDER — LORAZEPAM 2 MG/ML IJ SOLN: 0.5000 mg | Freq: Four times a day (QID) | INTRAMUSCULAR | Status: DC | PRN

## 2013-05-11 MED ORDER — METOCLOPRAMIDE HCL 5 MG/ML IJ SOLN: 10.0000 mg | Freq: Once | INTRAMUSCULAR | Status: AC

## 2013-05-11 MED ORDER — PIPERACILLIN-TAZOBACTAM 3.375 G IVPB
3.3750 g | Freq: Three times a day (TID) | INTRAVENOUS | Status: DC
  Administered 2013-05-11 – 2013-05-13 (×7): 3.375 g via INTRAVENOUS
  Filled 2013-05-11 (×9): qty 50

## 2013-05-11 MED ORDER — ENOXAPARIN SODIUM 40 MG/0.4ML ~~LOC~~ SOLN
40.0000 mg | SUBCUTANEOUS | Status: DC
  Administered 2013-05-12 – 2013-05-13 (×2): 40 mg via SUBCUTANEOUS
  Filled 2013-05-11 (×3): qty 0.4

## 2013-05-11 MED ORDER — ACETAMINOPHEN 10 MG/ML IV SOLN: 1000.0000 mg | Freq: Once | INTRAVENOUS | Status: DC

## 2013-05-11 MED ORDER — ENOXAPARIN SODIUM 40 MG/0.4ML ~~LOC~~ SOLN: 40.0000 mg | SUBCUTANEOUS | Status: DC

## 2013-05-11 MED ORDER — LACTATED RINGERS IV BOLUS (SEPSIS)
1000.0000 mL | Freq: Once | INTRAVENOUS | Status: AC
  Administered 2013-05-11: 1000 mL via INTRAVENOUS

## 2013-05-11 MED ORDER — ACETAMINOPHEN 10 MG/ML IV SOLN
INTRAVENOUS | Status: AC
  Filled 2013-05-11: qty 100

## 2013-05-11 MED ORDER — DOCUSATE SODIUM 100 MG PO CAPS
100.0000 mg | ORAL_CAPSULE | Freq: Two times a day (BID) | ORAL | Status: DC
  Filled 2013-05-11 (×2): qty 1

## 2013-05-11 MED ORDER — POTASSIUM CHLORIDE IN NACL 20-0.9 MEQ/L-% IV SOLN
INTRAVENOUS | Status: AC
  Filled 2013-05-11: qty 1000

## 2013-05-11 MED ORDER — STERILE WATER FOR IRRIGATION IR SOLN
Status: DC | PRN
  Administered 2013-05-11: 50 mL

## 2013-05-11 MED ORDER — KETOROLAC TROMETHAMINE 30 MG/ML IJ SOLN
15.0000 mg | Freq: Once | INTRAMUSCULAR | Status: AC | PRN
  Administered 2013-05-11: 30 mg via INTRAVENOUS

## 2013-05-11 MED ORDER — IOHEXOL 300 MG/ML  SOLN
INTRAMUSCULAR | Status: DC | PRN
  Administered 2013-05-11: 20 mL via INTRAVENOUS

## 2013-05-11 MED ORDER — LACTATED RINGERS IV SOLN
INTRAVENOUS | Status: DC | PRN
  Administered 2013-05-11: 08:00:00 via INTRAVENOUS

## 2013-05-11 MED ORDER — HYDROMORPHONE HCL PF 2 MG/ML IJ SOLN
INTRAMUSCULAR | Status: AC
  Filled 2013-05-11: qty 1

## 2013-05-11 MED ORDER — ONDANSETRON HCL 4 MG/2ML IJ SOLN: 4.0000 mg | Freq: Once | INTRAMUSCULAR | Status: DC

## 2013-05-11 MED ORDER — FENTANYL CITRATE 0.05 MG/ML IJ SOLN
INTRAMUSCULAR | Status: DC | PRN
  Administered 2013-05-11: 50 ug via INTRAVENOUS
  Administered 2013-05-11 (×2): 25 ug via INTRAVENOUS

## 2013-05-11 MED ORDER — SODIUM CHLORIDE 0.9 % IR SOLN
Status: DC | PRN
  Administered 2013-05-11: 3000 mL

## 2013-05-11 MED ORDER — ACETAMINOPHEN 10 MG/ML IV SOLN: INTRAVENOUS | Status: DC | PRN

## 2013-05-11 MED ORDER — IOHEXOL 300 MG/ML  SOLN
INTRAMUSCULAR | Status: AC
  Filled 2013-05-11: qty 1

## 2013-05-11 MED ORDER — HYDROMORPHONE HCL PF 1 MG/ML IJ SOLN: 1.0000 mg | Freq: Once | INTRAMUSCULAR | Status: DC

## 2013-05-11 MED ORDER — CITALOPRAM HYDROBROMIDE 40 MG PO TABS
40.0000 mg | ORAL_TABLET | Freq: Every day | ORAL | Status: DC
  Filled 2013-05-11: qty 1

## 2013-05-11 SURGICAL SUPPLY — 17 items
ADAPTER CATH URET PLST 4-6FR (CATHETERS) ×2 IMPLANT
ADPR CATH URET STRL DISP 4-6FR (CATHETERS) ×1
BAG URO CATCHER STRL LF (DRAPE) ×2 IMPLANT
BASKET ZERO TIP NITINOL 2.4FR (BASKET) IMPLANT
BSKT STON RTRVL ZERO TP 2.4FR (BASKET)
CATH INTERMIT  6FR 70CM (CATHETERS) IMPLANT
CLOTH BEACON ORANGE TIMEOUT ST (SAFETY) ×2 IMPLANT
DRAPE CAMERA CLOSED 9X96 (DRAPES) ×2 IMPLANT
GLOVE BIOGEL M STRL SZ7.5 (GLOVE) ×2 IMPLANT
GOWN STRL NON-REIN LRG LVL3 (GOWN DISPOSABLE) ×3 IMPLANT
GOWN STRL REIN XL XLG (GOWN DISPOSABLE) ×2 IMPLANT
GUIDEWIRE ANG ZIPWIRE 038X150 (WIRE) ×1 IMPLANT
GUIDEWIRE STR DUAL SENSOR (WIRE) ×2 IMPLANT
MANIFOLD NEPTUNE II (INSTRUMENTS) ×2 IMPLANT
PACK CYSTO (CUSTOM PROCEDURE TRAY) ×2 IMPLANT
STENT CONTOUR 6FRX24X.038 (STENTS) ×2 IMPLANT
TUBING CONNECTING 10 (TUBING) ×2 IMPLANT

## 2013-05-11 NOTE — ED Provider Notes (Signed)
Patient evaluated on transfer from [facility] [place]. Patient is tachycardic, she is not hypotensive, she is having some continued pain. We'll treat with fluids, add on levofloxacin and give additional pain medicine. Dr. Berneice Heinrich at bedside discussing surgical procedure for bilateral obstructive stones.  Pt pain is controlled, no questions at this time.   Date: 05/11/2013  Rate: 148  Rhythm: Sinus tachycardia  QRS Axis: normal  Intervals: normal  ST/T Wave abnormalities: normal  Conduction Disutrbances: none  Narrative Interpretation: unremarkable - sinus tachycardia      Jones Skene, MD 05/11/13 [phone]

## 2013-05-11 NOTE — Preoperative (Signed)
Beta Blockers   Reason not to administer Beta Blockers:Not Applicable 

## 2013-05-11 NOTE — Op Note (Signed)
Grace Singh, Grace Singh              ACCOUNT NO.:  [id number]  MEDICAL RECORD NO.:  [id number]  LOCATION:  1223                         FACILITY:  [facility]  PHYSICIAN:  Sebastian Ache, MD     DATE OF BIRTH:  July 28, 1966  DATE OF PROCEDURE:  05/11/2013 DATE OF DISCHARGE:                              OPERATIVE REPORT   PREOPERATIVE DIAGNOSIS:  Bilateral ureteral stones and urosepsis.  PROCEDURES: 1. Cystoscopy with bilateral retrograde pyelograms interpretation. 2. Insertion of bilateral ureteral stents.  ESTIMATED BLOOD LOSS:  Nil.  COMPLICATIONS:  None.  FINDINGS: 1. Very mild hydronephrosis with ureteral nephrosis and filling     defects on both sides consistent with UPJ stones. 2. Very purulent appearing urine readily evacuating via the stent     status post placement.  SPECIMENS:  Right renal pelvis fluid aspiration for Gram stain and culture.  INDICATION:  Grace Singh is a pleasant 47 year old lady with known history of left nephrolithiasis times many years for which she was observed.  She presented to the medical center in [facility] with acute onset of chills and bilateral flank pain.  She was found on CT scan there to have bilateral UPJ stones as well as right perinephric stranding.  Fever, chills, and tachycardia worrisome for impending urosepsis.  I immediately accepted the patient in urgent transfer to the [facility] Emergency Room where she was promptly evaluated and assessed for an emergent ureteral stents.  Informed consent was obtained and placed in medical record.  PROCEDURE IN DETAIL:  Patient being Grace Singh, procedure being cystoscopy and bilateral stent placement was confirmed.  Procedure was carried out.  Time-out was performed.  Intravenous antibiotics administered.  General LMA anesthesia was reduced.  Patient was placed into a low lithotomy position.  Sterile field was created by prepping the patient's vagina, introitus, and proximal thighs using  iodine x3. Next, cystourethroscopy was performed using a 22-French cystoscope with 12-degree offset lens.  Inspection of the urinary bladder revealed no diverticula, calcifications, papular lesions.  There was significant amount of cloudy urine in the urinary bladder.  The right ureteral orifice was then gently cannulated using a 6-French end-hole catheter. Right retrograde pyelogram was performed.  Right retrograde pyelogram demonstrates a single right ureter single system right kidney.  There was filling defect in the UPJ consistent with known stone.  This retrograde pyelogram was performed with the utmost care to avoid pyelovenous backflow.  A 0.038 Glidewire was advanced into left renal pelvis and end-hole catheter was advanced also to the renal pelvis.  Over this, an aspiration of approximately 5 mL of very purulent fluid was performed.  This was set aside for Gram stain and culture.  The Glidewire was replaced to the level of the upper pole and the end-hole catheter was exchanged for a new 6 x 24 double-J stent. Good proximal and distal curl were noted.  Efflux of purulent appearing urine was visible via the distal end of the stent.  Attention was directed to the left side.  Similarly, the left ureter was cannulated with a 6-French end-hole catheter and left retrograde pyelogram was gently obtained.  There was a single left ureter single system left kidney, single filling  defect in the area of the upper pole infundibulum consistent with known stone.  The 0.038 Glidewire was advanced across this and coiled in the upper pole and a new 6 x 24 double-J stent was carefully placed in this location with a proximal curl in the upper pole of the stone, distal curl in the urinary bladder.  The efflux of purulent appearing urine was seen around into the distal end of the stent.  During the procedure, the patient remained tachycardic and required some pressor support.  It was felt that the  Foley catheter was warranted.  A 16-French Foley catheter was placed per urethra with straight drain of 10 mL sterile water in the balloon.  Procedure was then terminated.  The patient tolerated the procedure well.  There were no immediate periprocedural complications.  The patient was taken to the postanesthesia care unit in critical condition with plan for step-down admission and close monitoring.          ______________________________ Sebastian Ache, MD     TM/MEDQ  D:  05/11/2013  T:  05/11/2013  Job:  [id number]

## 2013-05-11 NOTE — ED Notes (Signed)
Pt arrived in [facility] ER at 5:51am.

## 2013-05-11 NOTE — ED Notes (Signed)
ZOX:[id number]<AV> Expected date:05/11/13<BR> Expected time: 4:53 AM<BR> Means of arrival:Ambulance<BR> Comments:<BR> Transfer from med center

## 2013-05-11 NOTE — Consult Note (Signed)
PULMONARY  / CRITICAL CARE MEDICINE  Name: Grace Singh MRN: [id number] DOB: Jan 12, 1966    ADMISSION DATE:  05/11/2013 CONSULTATION DATE:  Margretta Sidle  REFERRING MD :  Berneice Heinrich PRIMARY SERVICE: [name]  CHIEF COMPLAINT: Shock  BRIEF PATIENT DESCRIPTION:  47 yo female who presented to [facility] Med center 6-6 with flank pain and dirty urine. CT revealed bilateral ureteral stones and she was transferred to [facility] and taken to OR for bilateral stent placement per Dr. Kathrynn Running. She was noted to be hypotensive intra op and was placed on neo drip. Transferred to ICU on pressors and PCCM asked to help manage her care.    SIGNIFICANT EVENTS / STUDIES:  6-6 bilateral stent ureteral   LINES / TUBES:   CULTURES: 6-6 uc>>  ANTIBIOTICS: 6-6 cipro>> 6-6 zoysn>>  HISTORY OF PRESENT ILLNESS:   47 yo female who presented to [facility] Med center 6-6 with flank pain and dirty urine. CT revealed bilateral ureteral stones and she was transferred to [facility] and taken to OR for bilateral stent placement per Dr. Kathrynn Running. She was noted to be hypotensive intra op and was placed on neo drip. Transferred to ICU on pressors and PCCM asked to help manage her care.    PAST MEDICAL HISTORY :  Past Medical History  Diagnosis Date  . Hypertension   . Anxiety   . Depression   . Asthma     weather related  . Diabetes mellitus without complication   . Chronic kidney disease     stage 3 kidney disease  . Fibromyalgia   . Anemia    Past Surgical History  Procedure Laterality Date  . Cholecystectomy    . Appendectomy    . Coccyx removal    . Hysteroscopy w/d&c N/A 04/20/2013    Procedure: DILATATION AND CURETTAGE /HYSTEROSCOPY;  Surgeon: Zelphia Cairo, MD;  Location: [facility] ORS;  Service: Gynecology;  Laterality: N/A;  . Polypectomy N/A 04/20/2013    Procedure: POLYPECTOMY;  Surgeon: Zelphia Cairo, MD;  Location: [facility] ORS;  Service: Gynecology;  Laterality: N/A;   Prior to Admission medications   Medication Sig Start  Date End Date Taking? Authorizing Provider  atorvastatin (LIPITOR) 80 MG tablet Take 40 mg by mouth daily.   Yes Historical Provider, MD  buPROPion (WELLBUTRIN) 75 MG tablet Take 75 mg by mouth 2 (two) times daily.   Yes Historical Provider, MD  citalopram (CELEXA) 40 MG tablet Take 40 mg by mouth daily.   Yes Historical Provider, MD  ferrous sulfate 325 (65 FE) MG tablet Take 325 mg by mouth 2 (two) times daily with a meal.   Yes Historical Provider, MD  hydrochlorothiazide (HYDRODIURIL) 25 MG tablet Take 12.5 mg by mouth daily.   Yes Historical Provider, MD  lamoTRIgine (LAMICTAL) 200 MG tablet Take 100-200 mg by mouth 2 (two) times daily. Takes 200 mg in am and 100 mg in pm   Yes Historical Provider, MD  lisinopril (PRINIVIL,ZESTRIL) 20 MG tablet Take 20 mg by mouth daily.   Yes Historical Provider, MD  LORazepam (ATIVAN) 0.5 MG tablet Take 0.5 mg by mouth every 8 (eight) hours as needed for anxiety.   Yes Historical Provider, MD  metFORMIN (GLUCOPHAGE) 1000 MG tablet Take 1,000 mg by mouth 2 (two) times daily with a meal.   Yes Historical Provider, MD  omega-3 acid ethyl esters (LOVAZA) 1 G capsule Take 2 g by mouth 2 (two) times daily.   Yes Historical Provider, MD  oxyCODONE-acetaminophen (PERCOCET) 7.5-325 MG per  tablet Take 1 tablet by mouth 2 (two) times daily.   Yes Historical Provider, MD  sitaGLIPtin (JANUVIA) 50 MG tablet Take 50 mg by mouth daily.   Yes Historical Provider, MD  Vitamin D, Ergocalciferol, (DRISDOL) 50000 UNITS CAPS Take 50,000 Units by mouth every 7 (seven) days. Sunday   Yes Historical Provider, MD   No Known Allergies  FAMILY HISTORY:  History reviewed. No pertinent family history. SOCIAL HISTORY:  reports that she has never smoked. She does not have any smokeless tobacco history on file. She reports that she does not drink alcohol or use illicit drugs.  REVIEW OF SYSTEMS:   10 point review of system taken, please see HPI for positives and  negatives.   SUBJECTIVE:  Denies chest pain.  Breathing okay.  Nausea improved.  VITAL SIGNS: Temp:  [98.1 F (36.7 C)-103.2 F (39.6 C)] 99.3 F (37.4 C) (06/06 1035) Pulse Rate:  [96-150] 105 (06/06 1100) Resp:  [12-22] 19 (06/06 1100) BP: (70-194)/(29-100) 115/51 mmHg (06/06 1100) SpO2:  [93 %-100 %] 100 % (06/06 1100) Weight:  [117.935 kg (260 lb)-128.4 kg (283 lb 1.1 oz)] 128.4 kg (283 lb 1.1 oz) (06/06 1055) Venti mask INTAKE / OUTPUT: Intake/Output     06 /05 0701 - 06/06 0700 06/06 0701 - 06/07 0700   I.V. (mL/kg)  2890 (22.5)   Total Intake(mL/kg)  2890 (22.5)   Urine (mL/kg/hr)  30 (0.1)   Total Output   30   Net   +2860          PHYSICAL EXAMINATION: General:  Obese [facility] nad  Neuro:  Intact HEENT:  No LAN Cardiovascular:  HSR RRR Lungs:  CTA Abdomen:  Tender, obese, +bs Musculoskeletal:  intact Skin:  Warm  LABS:  Recent Labs Lab 05/11/13 0409  HGB 13.7  WBC 11.9*  PLT 238  NA 138  K 3.6  CL 99  CO2 23  GLUCOSE 168*  BUN 23  CREATININE 1.10  CALCIUM 10.2    Recent Labs Lab 05/11/13 0850  GLUCAP 165*    Imaging: Ct Abdomen Pelvis Wo Contrast  05/11/2013   *RADIOLOGY REPORT*  Clinical Data: Bilateral flank pain, nausea and vomiting.  CT ABDOMEN AND PELVIS WITHOUT CONTRAST  Technique:  Multidetector CT imaging of the abdomen and pelvis was performed following the standard protocol without intravenous contrast.  Comparison: CT of the abdomen and pelvis performed 08/30/2007  Findings: The visualized lung bases are clear.  A small pericardial effusion is noted.  The liver and spleen are unremarkable in appearance.  The patient is status post cholecystectomy, with clips noted along the gallbladder fossa.  The pancreas and adrenal glands are unremarkable.  There is moderate right-sided hydronephrosis and moderately severe left-sided hydronephrosis.  On the left, this appears relatively chronic in nature, with marked dilatation of the renal calyces and  thinning of the renal parenchyma.  On the right, this is relatively acute, with extensive surrounding perinephric stranding and fluid. Underlying pyelonephritis cannot be excluded, given the degree of soft tissue stranding.  On the left, this appears to reflect slight distal migration of a 1.5 x 1.3 cm stone at the left renal pelvis, causing obstruction at the ureteropelvic junction.  On the right, this appears to reflect obstruction due to a 1.2 x 1.1 cm stone at the right renal pelvis, obstructing the right ureteropelvic junction.  Bilateral 9 mm nonobstructing stones are seen.  No significant left- sided perinephric stranding is appreciated.  The small bowel is unremarkable in appearance.  The stomach is within normal limits.  No acute vascular abnormalities are seen.  The patient is status post appendectomy.  The colon is largely decompressed.  Minimal diverticulosis is noted along the descending and proximal sigmoid colon, without evidence of diverticulitis.  The bladder is decompressed and not well assessed.  The uterus is grossly normal in size.  No suspicious adnexal masses are seen; the ovaries are grossly unremarkable in appearance.  No inguinal lymphadenopathy is seen.  No acute osseous abnormalities are identified.  IMPRESSION:  1.  Acute moderate right-sided hydronephrosis and relatively chronic appearing moderately severe left-sided hydronephrosis.  On the left, there is marked dilatation of the renal calyces and thinning of the renal parenchyma.  The relatively acute nature of the right-sided hydronephrosis is demonstrated by extensive surrounding perinephric stranding and fluid.  Underlying right-sided pyelonephritis cannot be excluded, given the degree of soft tissue stranding.  This reflects bilateral obstructing stones at the renal pelves, obstructing both ureteropelvic junctions.  The right-sided stone measures 1.2 x 1.1 cm, while the left-sided stone measures 1.5 x 1.3 cm.  Bilateral obstruction  raises concern for impending renal failure; would consult for emergent intervention.  2.  Bilateral 9 mm nonobstructing stones seen. 3.  Minimal diverticulosis along the descending and proximal sigmoid colon, without evidence of diverticulitis. 4.  Small pericardial effusion noted.  These results were called by telephone on 05/11/2013 at 04:31 a.m. to Dr. Brock Bad, who verbally acknowledged these results.   Original Report Authenticated By: Tonia Ghent, M.D.     ASSESSMENT / PLAN:  PULMONARY A:No acute issue P:   -monitor oxygenation  CARDIOVASCULAR A: Hx of HTN     Shock in setting of pyleonephritis  P:  Fluid resuscitation Wean pressors May need CVL F/u procalcitonin, lactic acid Hold lipitor, HCTZ, lisinopril  RENAL A:  Chronic renal dz      Bilateral stones P:   Monitor creatinine Continue IV fluids Stents per Urology   GASTROINTESTINAL A:  GI protection Nutrition P:   PPI NPO until more stable  HEMATOLOGIC A:  No acute issue P:  F/u CBC  INFECTIOUS A:  Pyelronephritis  P:   See flows  ENDOCRINE A:  DM P:   Hold metformin, januvia SSI  NEUROLOGIC A:  Pain control Hx of anxiety, depression, fibromyalgia P:   PRN dilaudid Hold wellbutrin, celexa, lamictal until able to eat  TODAY'S SUMMARY:  47 yo female who presented to [facility] Med center 6-6 with flank pain and dirty urine. CT revealed bilateral ureteral stones and she was transferred to [facility] and taken to OR for bilateral stent placement per Dr. Kathrynn Running. She was noted to be hypotensive intra op and was placed on neo drip. Transferred to ICU on pressors and PCCM asked to help manage her care.    Updated husband at bedside.  CC time 35 minutes.  Coralyn Helling, MD [facility] Pulmonary/Critical Care 05/11/2013, 12:11 PM Pager:  [phone] After 3pm call: [phone]

## 2013-05-11 NOTE — Anesthesia Preprocedure Evaluation (Signed)
Anesthesia Evaluation  Patient identified by MRN, date of birth, ID band Patient awake    Reviewed: Allergy & Precautions, H&P , NPO status , Patient's Chart, lab work & pertinent test results  Airway Mallampati: III TM Distance: <3 FB Neck ROM: Full    Dental no notable dental hx.    Pulmonary  breath sounds clear to auscultation  + decreased breath sounds      Cardiovascular hypertension, Pt. on medications Rhythm:Regular Rate:Normal     Neuro/Psych negative neurological ROS  negative psych ROS   GI/Hepatic negative GI ROS, Neg liver ROS,   Endo/Other  diabetes, Type obesity  Renal/GU Renal InsufficiencyRenal disease  negative genitourinary   Musculoskeletal negative musculoskeletal ROS (+)   Abdominal   Peds negative pediatric ROS (+)  Hematology negative hematology ROS (+)   Anesthesia Other Findings   Reproductive/Obstetrics negative OB ROS                           Anesthesia Physical Anesthesia Plan  ASA: III  Anesthesia Plan: General   Post-op Pain Management:    Induction: Intravenous  Airway Management Planned: LMA  Additional Equipment:   Intra-op Plan:   Post-operative Plan:   Informed Consent: I have reviewed the patients History and Physical, chart, labs and discussed the procedure including the risks, benefits and alternatives for the proposed anesthesia with the patient or authorized representative who has indicated his/her understanding and acceptance.   Dental advisory given  Plan Discussed with: CRNA and Surgeon  Anesthesia Plan Comments:         Anesthesia Quick Evaluation

## 2013-05-11 NOTE — Plan of Care (Signed)
S: Pt feeling subjectively improved vs. This AM, but still with sig malaise.  O: Fever trending down, UOP picking up with boluses, Pressors now weaned to off  NAD AOx3 SNTND Foley c/d/i with clearing urine  A/P: 1 - Bilateral  Ureteral Stones - Now s/p stenting as temporizing measure during infectious episode.  Pt will need elective treatment as outpatent in few weeks, will likely obtain renogram prior for baseline left kidney functions since appears atrophic.  2 - Urosepsis - CX pending. Greatly appreciate PCCM help.   3 - Dispo - Dr. Patsi Sears on call this weekend and to see from Urology.

## 2013-05-11 NOTE — H&P (Signed)
Grace Singh is an 47 y.o. female.    Chief Complaint: Bilateral Ureteral Stone, Fever and Tachycardia  HPI:   1 - Bilateral Ureteral Stone, Fever and Tachycardia - Pt with large bilateral UPJ stones, Lt 12mm and Rt 12mm + 7mm lower pole with perinephric stranding, hydro and fever of 103 and tachycardia up to 150. Left kidney appears atrophic. Seen at [facility] where this was found by CT and transferred urgently to [facility] ER. No piror stone treatment. Has known of left stone for sometime.   PMH sig for morbid obesiy, DM2, back surery, appy, GB, D+C 2 weeks ago, Asthma  Past Medical History  Diagnosis Date  . Hypertension   . Anxiety   . Depression   . Asthma     weather related  . Diabetes mellitus without complication   . Chronic kidney disease     stage 3 kidney disease  . Fibromyalgia   . Anemia     Past Surgical History  Procedure Laterality Date  . Cholecystectomy    . Appendectomy    . Coccyx removal    . Hysteroscopy w/d&c N/A 04/20/2013    Procedure: DILATATION AND CURETTAGE /HYSTEROSCOPY;  Surgeon: Zelphia Cairo, MD;  Location: [facility];  Service: Gynecology;  Laterality: N/A;  . Polypectomy N/A 04/20/2013    Procedure: POLYPECTOMY;  Surgeon: Zelphia Cairo, MD;  Location: [facility];  Service: Gynecology;  Laterality: N/A;    History reviewed. No pertinent family history. Social History:  reports that she has never smoked. She does not have any smokeless tobacco history on file. She reports that she does not drink alcohol or use illicit drugs.  Allergies: No Known Allergies   (Not in a hospital admission)  Results for orders placed during the hospital encounter of 05/11/13 (from the past 48 hour(s))  PREGNANCY, URINE     Status: None   Collection Time    05/11/13  3:17 AM      Result Value Range   Preg Test, Ur NEGATIVE  NEGATIVE   Comment:            THE SENSITIVITY OF THIS     METHODOLOGY IS >20 mIU/mL.  URINALYSIS, ROUTINE W REFLEX  MICROSCOPIC     Status: Abnormal   Collection Time    05/11/13  3:17 AM      Result Value Range   Color, Urine YELLOW  YELLOW   APPearance CLOUDY (*) CLEAR   Specific Gravity, Urine 1.007  1.005 - 1.030   pH 6.5  5.0 - 8.0   Glucose, UA NEGATIVE  NEGATIVE mg/dL   Hgb urine dipstick MODERATE (*) NEGATIVE   Bilirubin Urine NEGATIVE  NEGATIVE   Ketones, ur NEGATIVE  NEGATIVE mg/dL   Protein, ur NEGATIVE  NEGATIVE mg/dL   Urobilinogen, UA 0.2  0.0 - 1.0 mg/dL   Nitrite POSITIVE (*) NEGATIVE   Leukocytes, UA LARGE (*) NEGATIVE  URINE MICROSCOPIC-ADD ON     Status: Abnormal   Collection Time    05/11/13  3:17 AM      Result Value Range   Squamous Epithelial / LPF FEW (*) RARE   WBC, UA TOO NUMEROUS TO COUNT  <3 WBC/hpf   RBC / HPF 3-6  <3 RBC/hpf   Bacteria, UA MANY (*) RARE  CBC WITH DIFFERENTIAL     Status: Abnormal   Collection Time    05/11/13  4:09 AM      Result Value Range   WBC 11.9 (*)  4.0 - 10.5 K/uL   RBC 4.93  3.87 - 5.11 MIL/uL   Hemoglobin 13.7  12.0 - 15.0 g/dL   HCT 40.9  81.1 - 91.4 %   MCV 85.0  78.0 - 100.0 fL   MCH 27.8  26.0 - 34.0 pg   MCHC 32.7  30.0 - 36.0 g/dL   RDW 78.2 (*) 95.6 - 21.3 %   Platelets 238  150 - 400 K/uL   Neutrophils Relative % 88 (*) 43 - 77 %   Neutro Abs 10.4 (*) 1.7 - 7.7 K/uL   Lymphocytes Relative 10 (*) 12 - 46 %   Lymphs Abs 1.2  0.7 - 4.0 K/uL   Monocytes Relative 1 (*) 3 - 12 %   Monocytes Absolute 0.1  0.1 - 1.0 K/uL   Eosinophils Relative 1  0 - 5 %   Eosinophils Absolute 0.2  0.0 - 0.7 K/uL   Basophils Relative 0  0 - 1 %   Basophils Absolute 0.1  0.0 - 0.1 K/uL  BASIC METABOLIC PANEL     Status: Abnormal   Collection Time    05/11/13  4:09 AM      Result Value Range   Sodium 138  135 - 145 mEq/L   Potassium 3.6  3.5 - 5.1 mEq/L   Chloride 99  96 - 112 mEq/L   CO2 23  19 - 32 mEq/L   Glucose, Bld 168 (*) 70 - 99 mg/dL   BUN 23  6 - 23 mg/dL   Creatinine, Ser 0.86  0.50 - 1.10 mg/dL   Calcium 57.8  8.4 - 46.9  mg/dL   GFR calc non Af Amer 58 (*) >90 mL/min   GFR calc Af Amer 67 (*) >90 mL/min   Comment:            The eGFR has been calculated     using the CKD EPI equation.     This calculation has not been     validated in all clinical     situations.     eGFR's persistently     <90 mL/min signify     possible Chronic Kidney Disease.   Ct Abdomen Pelvis Wo Contrast  05/11/2013   *RADIOLOGY REPORT*  Clinical Data: Bilateral flank pain, nausea and vomiting.  CT ABDOMEN AND PELVIS WITHOUT CONTRAST  Technique:  Multidetector CT imaging of the abdomen and pelvis was performed following the standard protocol without intravenous contrast.  Comparison: CT of the abdomen and pelvis performed 08/30/2007  Findings: The visualized lung bases are clear.  A small pericardial effusion is noted.  The liver and spleen are unremarkable in appearance.  The patient is status post cholecystectomy, with clips noted along the gallbladder fossa.  The pancreas and adrenal glands are unremarkable.  There is moderate right-sided hydronephrosis and moderately severe left-sided hydronephrosis.  On the left, this appears relatively chronic in nature, with marked dilatation of the renal calyces and thinning of the renal parenchyma.  On the right, this is relatively acute, with extensive surrounding perinephric stranding and fluid. Underlying pyelonephritis cannot be excluded, given the degree of soft tissue stranding.  On the left, this appears to reflect slight distal migration of a 1.5 x 1.3 cm stone at the left renal pelvis, causing obstruction at the ureteropelvic junction.  On the right, this appears to reflect obstruction due to a 1.2 x 1.1 cm stone at the right renal pelvis, obstructing the right ureteropelvic junction.  Bilateral 9 mm nonobstructing  stones are seen.  No significant left- sided perinephric stranding is appreciated.  The small bowel is unremarkable in appearance.  The stomach is within normal limits.  No acute  vascular abnormalities are seen.  The patient is status post appendectomy.  The colon is largely decompressed.  Minimal diverticulosis is noted along the descending and proximal sigmoid colon, without evidence of diverticulitis.  The bladder is decompressed and not well assessed.  The uterus is grossly normal in size.  No suspicious adnexal masses are seen; the ovaries are grossly unremarkable in appearance.  No inguinal lymphadenopathy is seen.  No acute osseous abnormalities are identified.  IMPRESSION:  1.  Acute moderate right-sided hydronephrosis and relatively chronic appearing moderately severe left-sided hydronephrosis.  On the left, there is marked dilatation of the renal calyces and thinning of the renal parenchyma.  The relatively acute nature of the right-sided hydronephrosis is demonstrated by extensive surrounding perinephric stranding and fluid.  Underlying right-sided pyelonephritis cannot be excluded, given the degree of soft tissue stranding.  This reflects bilateral obstructing stones at the renal pelves, obstructing both ureteropelvic junctions.  The right-sided stone measures 1.2 x 1.1 cm, while the left-sided stone measures 1.5 x 1.3 cm.  Bilateral obstruction raises concern for impending renal failure; would consult for emergent intervention.  2.  Bilateral 9 mm nonobstructing stones seen. 3.  Minimal diverticulosis along the descending and proximal sigmoid colon, without evidence of diverticulitis. 4.  Small pericardial effusion noted.  These results were called by telephone on 05/11/2013 at 04:31 a.m. to Dr. Brock Bad, who verbally acknowledged these results.   Original Report Authenticated By: Tonia Ghent, M.D.    Review of Systems  Constitutional: Positive for fever, chills, malaise/fatigue and diaphoresis.  HENT: Negative.   Eyes: Negative.   Respiratory: Negative.   Cardiovascular: Negative.   Gastrointestinal: Positive for nausea. Negative for vomiting.  Genitourinary:  Positive for flank pain.  Musculoskeletal: Negative.   Skin: Negative.   Neurological: Negative.   Endo/Heme/Allergies: Negative.   Psychiatric/Behavioral: Negative.     Blood pressure 161/80, pulse 150, temperature 103.2 F (39.6 C), temperature source Oral, resp. rate 20, height 5\' 6"  (1.676 m), weight 117.935 kg (260 lb), last menstrual period 05/02/2013, SpO2 93.00%. Physical Exam  Constitutional: She is oriented to person, place, and time. She appears well-developed and well-nourished.  Morbidly obese  HENT:  Head: Normocephalic and atraumatic.  Eyes: EOM are normal. Pupils are equal, round, and reactive to light.  Neck: Normal range of motion. Neck supple.  Cardiovascular: Normal rate and regular rhythm.   Respiratory: Effort normal and breath sounds normal.  GI: Soft. Bowel sounds are normal.  Obestiy limits sensitivity of exam  Genitourinary:  Severe bilateral CVAT  Musculoskeletal: Normal range of motion.  Neurological: She is alert and oriented to person, place, and time.  Skin: Skin is warm and dry.  Psychiatric: She has a normal mood and affect. Her behavior is normal. Judgment and thought content normal.     Assessment/Plan  1 - Bilateral Ureteral Stone, Fever and Tachycardia - Presentation highly concerning for infected obstructing stones and urosepsis. No hypotension at this point and empiric Rocephin ad Levaquin given in ER. Pt still voiding. Explained need for urgent renal decompression with stents v. neph tubes followed by admission for infection and treatment of stones in several weeks with renogram piror to assess left (atrophic) kidney function.  She has chosen ureteral stenting. Risks including bleeding, infection, non-cure, damage to kidney / ureter / bladder, loss of  kidney explained. Rare risks including DVT,PE,MI,CVA,Mortality also explained. Posted emergently. Informed may require ICU admit post-op if infectious parameters worsen.   Sosaia Pittinger,  Diangelo Radel 05/11/2013, 6:35 AM

## 2013-05-11 NOTE — ED Notes (Signed)
C/o bilateral flank pain since 1am, N/V, denies any dysuria or hematuria.

## 2013-05-11 NOTE — ED Notes (Signed)
OR called and they are ready to receive patient.

## 2013-05-11 NOTE — Progress Notes (Signed)
ANTIBIOTIC CONSULT NOTE - INITIAL  Pharmacy Consult for Cipro/Zosyn Indication: Sepsis  No Known Allergies  Patient Measurements: Height: 5\' 6"  (167.6 cm) Weight: 283 lb 1.1 oz (128.4 kg) IBW/kg (Calculated) : 59.3  Vital Signs: Temp: 99.3 F (37.4 C) (06/06 1035) Temp src: Oral (06/06 0559) BP: 91/59 mmHg (06/06 1230) Pulse Rate: 103 (06/06 1230) Intake/Output from previous day:   Intake/Output from this shift: Total I/O In: 3390 [I.V.:2890; IV Piggyback:500] Out: 55 [Urine:55]  Labs:  Recent Labs  05/11/13 0409  WBC 11.9*  HGB 13.7  PLT 238  CREATININE 1.10   Estimated Creatinine Clearance: 84.9 ml/min (by C-G formula based on Cr of 1.1). No results found for this basename: VANCOTROUGH, VANCOPEAK, VANCORANDOM, GENTTROUGH, GENTPEAK, GENTRANDOM, TOBRATROUGH, TOBRAPEAK, TOBRARND, AMIKACINPEAK, AMIKACINTROU, AMIKACIN,  in the last 72 hours   Microbiology: No results found for this or any previous visit (from the past 720 hour(s)).  Medical History: Past Medical History  Diagnosis Date  . Hypertension   . Anxiety   . Depression   . Asthma     weather related  . Diabetes mellitus without complication   . Chronic kidney disease     stage 3 kidney disease  . Fibromyalgia   . Anemia    Medications:  Anti-infectives   Start     Dose/Rate Route Frequency Ordered Stop   05/11/13 2200  ciprofloxacin (CIPRO) IVPB 400 mg     400 mg 200 mL/hr over 60 Minutes Intravenous Every 12 hours 05/11/13 1108     05/11/13 1200  piperacillin-tazobactam (ZOSYN) IVPB 3.375 g     3.375 g 12.5 mL/hr over 240 Minutes Intravenous Every 8 hours 05/11/13 1108     05/11/13 0730  cefTRIAXone (ROCEPHIN) 1 g in dextrose 5 % 50 mL IVPB  Status:  Discontinued     1 g 100 mL/hr over 30 Minutes Intravenous Every 24 hours 05/11/13 0724 05/11/13 0734   05/11/13 0615  [MAR Hold]  levofloxacin (LEVAQUIN) IVPB 750 mg     (On MAR Hold since 05/11/13 0720)   750 mg 100 mL/hr over 90 Minutes  Intravenous  Once 05/11/13 0602 05/11/13 0747   05/11/13 0415  cefTRIAXone (ROCEPHIN) 1 g in dextrose 5 % 50 mL IVPB     1 g 100 mL/hr over 30 Minutes Intravenous  Once 05/11/13 0408 05/11/13 0453     Assessment: 47 yoF admit with hydronephrosis, fever, bilateral renal stones, s/p bilateral stents placed 6/6 (presented to [facility] with pain/fever, abd CT)  Hx of CKD stage 3, atrophic L kidney. Admit SCr 1.10  Hx of DM  Received Levaquin, Rocephin emergently; required pressors in PACU  To ICU for treatment of presumed septic shock  Cipro and Zosyn per Rx  Goal of Therapy:  Abx appropriate for infection, renal function  Plan:  Cipro 400mg  IV q12 Zosyn 3.375 gm q8  Otho Bellows PharmD Pager [phone] 05/11/2013, 12:46 PM

## 2013-05-11 NOTE — Progress Notes (Signed)
CARE MANAGEMENT NOTE 05/11/2013  Patient:  Grace Singh, Grace Singh   Account Number:  [id number]  Date Initiated:  05/11/2013  Documentation initiated by:  Vauda Salvucci  Subjective/Objective Assessment:   pt admitted with hypotension and sepsis     Action/Plan:   lives with her children   Anticipated DC Date:  05/14/2013   Anticipated DC Plan:  HOME/SELF CARE  In-house referral  NA      DC Planning Services  NA      [facility] Choice  NA   Choice offered to / List presented to:  NA   DME arranged  NA      DME agency  NA     HH arranged  NA      HH agency  NA   Status of service:  In process, will continue to follow Medicare Important Message given?  NA - LOS <3 / Initial given by admissions (If response is "NO", the following Medicare IM given date fields will be blank) Date Medicare IM given:   Date Additional Medicare IM given:    Discharge Disposition:    Per UR Regulation:  Reviewed for med. necessity/level of care/duration of stay  If discussed at Long Length of Stay Meetings, dates discussed:    Comments:  [id number]/Aubree Doody Earlene Plater, RN, BSN, CCM: CHART REVIEWED AND UPDATED.  Next chart review due on [id number]. NO DISCHARGE NEEDS PRESENT AT THIS TIME. CASE MANAGEMENT [id number]

## 2013-05-11 NOTE — Progress Notes (Signed)
PACU Dr. Berneice Heinrich aware of Neo drip added and need to change to ICU status. CCM to see.

## 2013-05-11 NOTE — Anesthesia Postprocedure Evaluation (Signed)
  Anesthesia Post-op Note  Patient: Grace Singh  Procedure(s) Performed: Procedure(s) (LRB): CYSTOSCOPY WITH STENT PLACEMENT (Bilateral)  Patient Location: PACU  Anesthesia Type: General  Level of Consciousness: awake and alert   Airway and Oxygen Therapy: Patient Spontanous Breathing  Post-op Pain: mild  Post-op Assessment: Post-op Vital signs reviewed, Patient's Cardiovascular Status Stable, Respiratory Function Stable, Patent Airway and No signs of Nausea or vomiting  Last Vitals:  Filed Vitals:   05/11/13 0950  BP: 84/42  Pulse: 112  Temp:   Resp: 13    Post-op Vital Signs: stable   Complications: No apparent anesthesia complications

## 2013-05-11 NOTE — Transfer of Care (Signed)
Immediate Anesthesia Transfer of Care Note  Patient: Grace Singh  Procedure(s) Performed: Procedure(s): CYSTOSCOPY WITH STENT PLACEMENT (Bilateral)  Patient Location: PACU  Anesthesia Type:General  Level of Consciousness: awake, oriented, patient cooperative, lethargic and responds to stimulation  Airway & Oxygen Therapy: Patient Spontanous Breathing and Patient connected to face mask oxygen  Post-op Assessment: Report given to PACU RN, Post -op Vital signs reviewed and stable and Patient moving all extremities  Post vital signs: Reviewed and stable  Complications: No apparent anesthesia complications

## 2013-05-11 NOTE — ED Provider Notes (Signed)
History     CSN: [id number]  Arrival date & time 05/11/13  0300   First MD Initiated Contact with Patient 05/11/13 0310      Chief Complaint  Patient presents with  . Flank Pain    (Consider location/radiation/quality/duration/timing/severity/associated sxs/prior treatment) HPI This is a 47 year old female who awoke at 1 AM today with severe pain in her flanks bilaterally. She describes the pain as feeling like someone kicked her as hard as they could. The pain has been associated with nausea and vomiting. She has not noticed blood in her urine. She has not had dysuria. She has not had a fever but has had chills. She has no history of kidney stones. The pain is worse with movement but not palpation. She's not having any shortness of breath.  Past Medical History  Diagnosis Date  . Hypertension   . Anxiety   . Depression   . Asthma     weather related  . Diabetes mellitus without complication   . Chronic kidney disease     stage 3 kidney disease  . Fibromyalgia   . Anemia     Past Surgical History  Procedure Laterality Date  . Cholecystectomy    . Appendectomy    . Coccyx removal    . Hysteroscopy w/d&c N/A 04/20/2013    Procedure: DILATATION AND CURETTAGE /HYSTEROSCOPY;  Surgeon: Zelphia Cairo, MD;  Location: [facility];  Service: Gynecology;  Laterality: N/A;  . Polypectomy N/A 04/20/2013    Procedure: POLYPECTOMY;  Surgeon: Zelphia Cairo, MD;  Location: [facility];  Service: Gynecology;  Laterality: N/A;    History reviewed. No pertinent family history.  History  Substance Use Topics  . Smoking status: Never Smoker   . Smokeless tobacco: Not on file  . Alcohol Use: No    OB History   Grav Para Term Preterm Abortions TAB SAB Ect Mult Living                  Review of Systems  All other systems reviewed and are negative.    Allergies  Review of patient's allergies indicates no known allergies.  Home Medications   Current Outpatient Rx  Name  Route  Sig   Dispense  Refill  . buPROPion (WELLBUTRIN) 75 MG tablet   Oral   Take 75 mg by mouth 2 (two) times daily.         . citalopram (CELEXA) 40 MG tablet   Oral   Take 40 mg by mouth daily.         . ferrous sulfate 325 (65 FE) MG tablet   Oral   Take 325 mg by mouth 2 (two) times daily with a meal.         . hydrochlorothiazide (HYDRODIURIL) 25 MG tablet   Oral   Take 12.5 mg by mouth daily.         Marland Kitchen lisinopril (PRINIVIL,ZESTRIL) 20 MG tablet   Oral   Take 20 mg by mouth daily.         . metFORMIN (GLUCOPHAGE) 1000 MG tablet   Oral   Take 1,000 mg by mouth 2 (two) times daily with a meal.         . oxyCODONE-acetaminophen (PERCOCET) 7.5-325 MG per tablet   Oral   Take 1 tablet by mouth 2 (two) times daily.         . sitaGLIPtin (JANUVIA) 50 MG tablet   Oral   Take 50 mg by mouth daily.         Marland Kitchen  Vitamin D, Ergocalciferol, (DRISDOL) 50000 UNITS CAPS   Oral   Take 50,000 Units by mouth every 7 (seven) days. Sunday         . atorvastatin (LIPITOR) 80 MG tablet   Oral   Take 40 mg by mouth daily.         Marland Kitchen lamoTRIgine (LAMICTAL) 200 MG tablet   Oral   Take 100-200 mg by mouth 2 (two) times daily. Takes 200 mg in am and 100 mg in pm         . omega-3 acid ethyl esters (LOVAZA) 1 G capsule   Oral   Take 2 g by mouth 2 (two) times daily.           BP 181/100  Pulse 96  Temp(Src) 98.1 F (36.7 C) (Oral)  Resp 22  Ht 5\' 6"  (1.676 m)  Wt 260 lb (117.935 kg)  BMI 41.99 kg/m2  SpO2 99%  LMP 05/02/2013  Physical Exam General: Well-developed, well-nourished female in no acute distress; appearance consistent with age of record HENT: normocephalic, atraumatic Eyes: pupils equal round and reactive to light; extraocular muscles intact Neck: supple Heart: regular rate and rhythm Lungs: clear to auscultation bilaterally Abdomen: soft; obese; nondistended; nontender; bowel sounds present GU: No CVA tenderness; urine cloudy Extremities: No  deformity; full range of motion; pulses normal; no edema Neurologic: Awake, alert and oriented; motor function intact in all extremities and symmetric; no facial droop Skin: Warm and dry Psychiatric: Anxious    ED Course  Procedures (including critical care time)  CRITICAL CARE Performed by: Kaspar Albornoz L Total critical care time: 30 minutes Critical care time was exclusive of separately billable procedures and treating other patients. Critical care was necessary to treat or prevent imminent or life-threatening deterioration. Critical care was time spent personally by me on the following activities: development of treatment plan with patient and/or surrogate as well as nursing, discussions with consultants, evaluation of patient's response to treatment, examination of patient, obtaining history from patient or surrogate, ordering and performing treatments and interventions, ordering and review of laboratory studies, ordering and review of radiographic studies, pulse oximetry and re-evaluation of patient's condition.   MDM   Nursing notes and vitals signs, including pulse oximetry, reviewed.  Summary of this visit's results, reviewed by myself:  Labs:  Results for orders placed during the hospital encounter of 05/11/13 (from the past 24 hour(s))  PREGNANCY, URINE     Status: None   Collection Time    05/11/13  3:17 AM      Result Value Range   Preg Test, Ur NEGATIVE  NEGATIVE  URINALYSIS, ROUTINE W REFLEX MICROSCOPIC     Status: Abnormal   Collection Time    05/11/13  3:17 AM      Result Value Range   Color, Urine YELLOW  YELLOW   APPearance CLOUDY (*) CLEAR   Specific Gravity, Urine 1.007  1.005 - 1.030   pH 6.5  5.0 - 8.0   Glucose, UA NEGATIVE  NEGATIVE mg/dL   Hgb urine dipstick MODERATE (*) NEGATIVE   Bilirubin Urine NEGATIVE  NEGATIVE   Ketones, ur NEGATIVE  NEGATIVE mg/dL   Protein, ur NEGATIVE  NEGATIVE mg/dL   Urobilinogen, UA 0.2  0.0 - 1.0 mg/dL   Nitrite  POSITIVE (*) NEGATIVE   Leukocytes, UA LARGE (*) NEGATIVE  URINE MICROSCOPIC-ADD ON     Status: Abnormal   Collection Time    05/11/13  3:17 AM      Result Value  Range   Squamous Epithelial / LPF FEW (*) RARE   WBC, UA TOO NUMEROUS TO COUNT  <3 WBC/hpf   RBC / HPF 3-6  <3 RBC/hpf   Bacteria, UA MANY (*) RARE  CBC WITH DIFFERENTIAL     Status: Abnormal   Collection Time    05/11/13  4:09 AM      Result Value Range   WBC 11.9 (*) 4.0 - 10.5 K/uL   RBC 4.93  3.87 - 5.11 MIL/uL   Hemoglobin 13.7  12.0 - 15.0 g/dL   HCT 16.1  09.6 - 04.5 %   MCV 85.0  78.0 - 100.0 fL   MCH 27.8  26.0 - 34.0 pg   MCHC 32.7  30.0 - 36.0 g/dL   RDW 40.9 (*) 81.1 - 91.4 %   Platelets 238  150 - 400 K/uL   Neutrophils Relative % 88 (*) 43 - 77 %   Neutro Abs 10.4 (*) 1.7 - 7.7 K/uL   Lymphocytes Relative 10 (*) 12 - 46 %   Lymphs Abs 1.2  0.7 - 4.0 K/uL   Monocytes Relative 1 (*) 3 - 12 %   Monocytes Absolute 0.1  0.1 - 1.0 K/uL   Eosinophils Relative 1  0 - 5 %   Eosinophils Absolute 0.2  0.0 - 0.7 K/uL   Basophils Relative 0  0 - 1 %   Basophils Absolute 0.1  0.0 - 0.1 K/uL  BASIC METABOLIC PANEL     Status: Abnormal   Collection Time    05/11/13  4:09 AM      Result Value Range   Sodium 138  135 - 145 mEq/L   Potassium 3.6  3.5 - 5.1 mEq/L   Chloride 99  96 - 112 mEq/L   CO2 23  19 - 32 mEq/L   Glucose, Bld 168 (*) 70 - 99 mg/dL   BUN 23  6 - 23 mg/dL   Creatinine, Ser 7.82  0.50 - 1.10 mg/dL   Calcium 95.6  8.4 - 21.3 mg/dL   GFR calc non Af Amer 58 (*) >90 mL/min   GFR calc Af Amer 67 (*) >90 mL/min    Imaging Studies: Ct Abdomen Pelvis Wo Contrast  05/11/2013   *RADIOLOGY REPORT*  Clinical Data: Bilateral flank pain, nausea and vomiting.  CT ABDOMEN AND PELVIS WITHOUT CONTRAST  Technique:  Multidetector CT imaging of the abdomen and pelvis was performed following the standard protocol without intravenous contrast.  Comparison: CT of the abdomen and pelvis performed 08/30/2007  Findings:  The visualized lung bases are clear.  A small pericardial effusion is noted.  The liver and spleen are unremarkable in appearance.  The patient is status post cholecystectomy, with clips noted along the gallbladder fossa.  The pancreas and adrenal glands are unremarkable.  There is moderate right-sided hydronephrosis and moderately severe left-sided hydronephrosis.  On the left, this appears relatively chronic in nature, with marked dilatation of the renal calyces and thinning of the renal parenchyma.  On the right, this is relatively acute, with extensive surrounding perinephric stranding and fluid. Underlying pyelonephritis cannot be excluded, given the degree of soft tissue stranding.  On the left, this appears to reflect slight distal migration of a 1.5 x 1.3 cm stone at the left renal pelvis, causing obstruction at the ureteropelvic junction.  On the right, this appears to reflect obstruction due to a 1.2 x 1.1 cm stone at the right renal pelvis, obstructing the right ureteropelvic junction.  Bilateral  9 mm nonobstructing stones are seen.  No significant left- sided perinephric stranding is appreciated.  The small bowel is unremarkable in appearance.  The stomach is within normal limits.  No acute vascular abnormalities are seen.  The patient is status post appendectomy.  The colon is largely decompressed.  Minimal diverticulosis is noted along the descending and proximal sigmoid colon, without evidence of diverticulitis.  The bladder is decompressed and not well assessed.  The uterus is grossly normal in size.  No suspicious adnexal masses are seen; the ovaries are grossly unremarkable in appearance.  No inguinal lymphadenopathy is seen.  No acute osseous abnormalities are identified.  IMPRESSION:  1.  Acute moderate right-sided hydronephrosis and relatively chronic appearing moderately severe left-sided hydronephrosis.  On the left, there is marked dilatation of the renal calyces and thinning of the renal  parenchyma.  The relatively acute nature of the right-sided hydronephrosis is demonstrated by extensive surrounding perinephric stranding and fluid.  Underlying right-sided pyelonephritis cannot be excluded, given the degree of soft tissue stranding.  This reflects bilateral obstructing stones at the renal pelves, obstructing both ureteropelvic junctions.  The right-sided stone measures 1.2 x 1.1 cm, while the left-sided stone measures 1.5 x 1.3 cm.  Bilateral obstruction raises concern for impending renal failure; would consult for emergent intervention.  2.  Bilateral 9 mm nonobstructing stones seen. 3.  Minimal diverticulosis along the descending and proximal sigmoid colon, without evidence of diverticulitis. 4.  Small pericardial effusion noted.  These results were called by telephone on 05/11/2013 at 04:31 a.m. to Dr. Brock Bad, who verbally acknowledged these results.   Original Report Authenticated By: Tonia Ghent, M.D.   4:40 AM Patient complains of continued pain despite IV Dilaudid. IV Rocephin started for pyelonephritis. Bilateral obstructing stones represents a surgical emergency and we will obtain urgent urologic consult.  4:48 AM Dr. Berneice Heinrich accepts for transfer to [facility] ED.          Hanley Seamen, MD 05/11/13 [phone]

## 2013-05-11 NOTE — Brief Op Note (Signed)
05/11/2013  8:23 AM  PATIENT:  Grace Singh  47 y.o. female  PRE-OPERATIVE DIAGNOSIS:  bilateral ureteral stones urosepsis  POST-OPERATIVE DIAGNOSIS:  bilateral ureteral stones, urosepsis  PROCEDURE:  Procedure(s): CYSTOSCOPY WITH STENT PLACEMENT (Bilateral)  SURGEON:  Surgeon(s) and Role:    * Sebastian Ache, MD - Primary  PHYSICIAN ASSISTANT:   ASSISTANTS: none   ANESTHESIA:   general  EBL:     BLOOD ADMINISTERED:none  DRAINS: Foley - straight drain   LOCAL MEDICATIONS USED:  NONE  SPECIMEN:  Source of Specimen:  Rt Kidney Fluid  DISPOSITION OF SPECIMEN:  Microbiologi - gram stain and culture  COUNTS:  YES  TOURNIQUET:  * No tourniquets in log *  DICTATION: .Other Dictation: Dictation Number [id number]  PLAN OF CARE: Admit to inpatient   PATIENT DISPOSITION:  PACU - hemodynamically stable.   Delay start of Pharmacological VTE agent (>24hrs) due to surgical blood loss or risk of bleeding: no

## 2013-05-12 LAB — BASIC METABOLIC PANEL
Calcium: 8 mg/dL — ABNORMAL LOW (ref 8.4–10.5)
Creatinine, Ser: 1.28 mg/dL — ABNORMAL HIGH (ref 0.50–1.10)
GFR calc Af Amer: 56 mL/min — ABNORMAL LOW (ref >90)
GFR calc non Af Amer: 48 mL/min — ABNORMAL LOW (ref >90)
Sodium: 140 mEq/L (ref 135–145)

## 2013-05-12 LAB — CBC
MCH: 25.6 pg — ABNORMAL LOW (ref 26.0–34.0)
MCHC: 29.6 g/dL — ABNORMAL LOW (ref 30.0–36.0)
MCV: 86.3 fL (ref 78.0–100.0)
Platelets: 151 10*3/uL (ref 150–400)
RBC: 4.38 MIL/uL (ref 3.87–5.11)
RDW: 16.2 % — ABNORMAL HIGH (ref 11.5–15.5)

## 2013-05-12 LAB — GLUCOSE, CAPILLARY
Glucose-Capillary: 152 mg/dL — ABNORMAL HIGH (ref 70–99)
Glucose-Capillary: 115 mg/dL — ABNORMAL HIGH (ref 70–99)

## 2013-05-12 MED ORDER — INSULIN ASPART 100 UNIT/ML ~~LOC~~ SOLN
0.0000 [IU] | Freq: Three times a day (TID) | SUBCUTANEOUS | Status: DC
  Administered 2013-05-12: 1 [IU] via SUBCUTANEOUS
  Administered 2013-05-12: 2 [IU] via SUBCUTANEOUS
  Administered 2013-05-13 (×2): 1 [IU] via SUBCUTANEOUS

## 2013-05-12 MED ORDER — KETOROLAC TROMETHAMINE 15 MG/ML IJ SOLN
15.0000 mg | Freq: Four times a day (QID) | INTRAMUSCULAR | Status: AC | PRN
  Administered 2013-05-12 (×3): 15 mg via INTRAVENOUS
  Filled 2013-05-12 (×3): qty 1

## 2013-05-12 MED ORDER — SODIUM CHLORIDE 0.9 % IV SOLN
INTRAVENOUS | Status: DC
  Administered 2013-05-12: 1000 mL via INTRAVENOUS
  Administered 2013-05-13: via INTRAVENOUS

## 2013-05-12 MED ORDER — FENTANYL CITRATE 0.05 MG/ML IJ SOLN
25.0000 ug | INTRAMUSCULAR | Status: DC | PRN
  Administered 2013-05-12 (×2): 50 ug via INTRAVENOUS
  Filled 2013-05-12 (×2): qty 2

## 2013-05-12 MED ORDER — ONDANSETRON HCL 4 MG/2ML IJ SOLN: 4.0000 mg | INTRAMUSCULAR | Status: DC | PRN

## 2013-05-12 MED ORDER — PROMETHAZINE HCL 25 MG/ML IJ SOLN
12.5000 mg | Freq: Four times a day (QID) | INTRAMUSCULAR | Status: AC | PRN
  Administered 2013-05-12 – 2013-05-13 (×2): 12.5 mg via INTRAVENOUS
  Filled 2013-05-12 (×2): qty 1

## 2013-05-12 NOTE — Progress Notes (Signed)
1 Day Post-Op Subjective:  yo female who presented to [facility] 6[facility]6 with flank pain and dirty urine. CT revealed bilateral ureteral stones and she was transferred to [facility] and taken to OR for bilateral stent placement per Dr. Berneice Heinrich. She was noted to be hypotensive intra op and was placed on neo drip. Transferred to ICU on pressors and PCCM asked to help manage her care.  Patient reports continued headache, despite Rx throughout night.   Objective: Vital signs in last 24 hours: Temp:  [98.3 F (36.8 C)-99.6 F (37.6 C)] 99.6 F (37.6 C) (06/07 0814) Pulse Rate:  [93-112] 99 (06/07 0600) Resp:  [7-24] 11 (06/07 0600) BP: (83-157)/(38-95) 123/53 mmHg (06/07 0600) SpO2:  [96 %-100 %] 96 % (06/07 0600) Weight:  [127.7 kg (281 lb 8.4 oz)-128.4 kg (283 lb 1.1 oz)] 127.7 kg (281 lb 8.4 oz) (06/07 0400)  Intake/Output from previous day: 06/06 0701 - 06/07 0700 In: 7115 [I.V.:5265; IV Piggyback:1850] Out: 2450 [Urine:2450] Intake/Output this shift:    Physical Exam:  General:alert and cooperative GI: not done and soft, non tender, normal bowel sounds, no palpable masses, no organomegaly, no inguinal hernia Female genitalia: not done Extremities: extremities normal, atraumatic, no cyanosis or edema  Lab Results:  Recent Labs  05/11/13 0409 05/12/13 0344  HGB 13.7 11.2*  HCT 41.9 37.8   BMET  Recent Labs  05/11/13 0409 05/12/13 0344  NA 138 140  K 3.6 3.8  CL 99 105  CO2 23 27  GLUCOSE 168* 118*  BUN 23 21  CREATININE 1.10 1.28*  CALCIUM 10.2 8.0*  procalcitonin 51.    No results found for this basename: LABPT, INR,  in the last 72 hours No results found for this basename: LABURIN,  in the last 72 hours Results for orders placed during the hospital encounter of 05/11/13  URINE CULTURE     Status: None   Collection Time    05/11/13  3:17 AM      Result Value Range Status   Specimen Description URINE, CLEAN CATCH   Final   Special Requests NONE   Final   Culture  Setup Time 05/11/2013 10:02   Final   Colony Count >=100,000 COLONIES/ML   Final   Culture GRAM NEGATIVE RODS   Final   Report Status PENDING   Incomplete  MRSA PCR SCREENING     Status: None   Collection Time    05/11/13 11:08 AM      Result Value Range Status   MRSA by PCR NEGATIVE  NEGATIVE Final   Comment:            The GeneXpert MRSA Assay (FDA     approved for NASAL specimens     only), is one component of a     comprehensive MRSA colonization     surveillance program. It is not     intended to diagnose MRSA     infection nor to guide or     monitor treatment for     MRSA infections.    Studies/Results: Ct Abdomen Pelvis Wo Contrast  05/11/2013   *RADIOLOGY REPORT*  Clinical Data: Bilateral flank pain, nausea and vomiting.  CT ABDOMEN AND PELVIS WITHOUT CONTRAST  Technique:  Multidetector CT imaging of the abdomen and pelvis was performed following the standard protocol without intravenous contrast.  Comparison: CT of the abdomen and pelvis performed 08/30/2007  Findings: The visualized lung bases are clear.  A small pericardial effusion is noted.  The  liver and spleen are unremarkable in appearance.  The patient is status post cholecystectomy, with clips noted along the gallbladder fossa.  The pancreas and adrenal glands are unremarkable.  There is moderate right-sided hydronephrosis and moderately severe left-sided hydronephrosis.  On the left, this appears relatively chronic in nature, with marked dilatation of the renal calyces and thinning of the renal parenchyma.  On the right, this is relatively acute, with extensive surrounding perinephric stranding and fluid. Underlying pyelonephritis cannot be excluded, given the degree of soft tissue stranding.  On the left, this appears to reflect slight distal migration of a 1.5 x 1.3 cm stone at the left renal pelvis, causing obstruction at the ureteropelvic junction.  On the right, this appears to reflect obstruction due to a 1.2  x 1.1 cm stone at the right renal pelvis, obstructing the right ureteropelvic junction.  Bilateral 9 mm nonobstructing stones are seen.  No significant left- sided perinephric stranding is appreciated.  The small bowel is unremarkable in appearance.  The stomach is within normal limits.  No acute vascular abnormalities are seen.  The patient is status post appendectomy.  The colon is largely decompressed.  Minimal diverticulosis is noted along the descending and proximal sigmoid colon, without evidence of diverticulitis.  The bladder is decompressed and not well assessed.  The uterus is grossly normal in size.  No suspicious adnexal masses are seen; the ovaries are grossly unremarkable in appearance.  No inguinal lymphadenopathy is seen.  No acute osseous abnormalities are identified.  IMPRESSION:  1.  Acute moderate right-sided hydronephrosis and relatively chronic appearing moderately severe left-sided hydronephrosis.  On the left, there is marked dilatation of the renal calyces and thinning of the renal parenchyma.  The relatively acute nature of the right-sided hydronephrosis is demonstrated by extensive surrounding perinephric stranding and fluid.  Underlying right-sided pyelonephritis cannot be excluded, given the degree of soft tissue stranding.  This reflects bilateral obstructing stones at the renal pelves, obstructing both ureteropelvic junctions.  The right-sided stone measures 1.2 x 1.1 cm, while the left-sided stone measures 1.5 x 1.3 cm.  Bilateral obstruction raises concern for impending renal failure; would consult for emergent intervention.  2.  Bilateral 9 mm nonobstructing stones seen. 3.  Minimal diverticulosis along the descending and proximal sigmoid colon, without evidence of diverticulitis. 4.  Small pericardial effusion noted.  These results were called by telephone on 05/11/2013 at 04:31 a.m. to Dr. Brock Bad, who verbally acknowledged these results.   Original Report Authenticated By:  Tonia Ghent, M.D.    Assessment/Plan: Kidney Stones with bilateral stents.  CCM to follow . H/a mgt per CCM   LOS: 1 day   Grace Singh I 05/12/2013, 10:03 AM

## 2013-05-12 NOTE — Progress Notes (Signed)
Pt woke up having nausea, vomiting greenish secretions, 4mg  Zofran given IV without relief. Pt complained of a 8/10 headache which has been on going for her since the beginning of the shift, headache was not relieved by PO Acetaminophen, she also c/o of bilateral flank pain. MD on call for Urology; Dr Marcello Fennel was notified, new orders were received. Will continue to monitor.

## 2013-05-12 NOTE — Progress Notes (Signed)
PULMONARY  / CRITICAL CARE MEDICINE  Name: Grace Singh MRN: [id number] DOB: December 16, 1965    ADMISSION DATE:  05/11/2013 CONSULTATION DATE:  Grace Singh  REFERRING MD :  Grace Singh PRIMARY SERVICE: [name]  CHIEF COMPLAINT: Shock  BRIEF PATIENT DESCRIPTION:  47 yo female who presented to [facility] 6-6 with flank pain and dirty urine. CT revealed bilateral ureteral stones and she was transferred to [facility] and taken to OR for bilateral stent placement per Dr. Berneice Singh. She was noted to be hypotensive intra op and was placed on neo drip. Transferred to ICU on pressors and PCCM asked to help manage her care.    SIGNIFICANT EVENTS / STUDIES:  6-6 bilateral stent ureteral   LINES / TUBES:   CULTURES: 6-6 uc>>  ANTIBIOTICS: 6-6 cipro>> 6-6 zoysn>>  HISTORY OF PRESENT ILLNESS:   47 yo female who presented to [facility] 6-6 with flank pain and dirty urine. CT revealed bilateral ureteral stones and she was transferred to [facility] and taken to OR for bilateral stent placement per Dr. Kathrynn Singh. She was noted to be hypotensive intra op and was placed on neo drip. Transferred to ICU on pressors and PCCM asked to help manage her care.     SUBJECTIVE:  C/o headache this am, received toradol, dilaudid causes nausea Afebrile Denies chest pain.  Breathing okay.   Mild abdominal pain  VITAL SIGNS: Temp:  [98.3 F (36.8 C)-100.7 F (38.2 C)] 99.6 F (37.6 C) (06/07 0814) Pulse Rate:  [93-123] 99 (06/07 0600) Resp:  [7-24] 11 (06/07 0600) BP: (70-157)/(29-95) 123/53 mmHg (06/07 0600) SpO2:  [96 %-100 %] 96 % (06/07 0600) Weight:  [127.7 kg (281 lb 8.4 oz)-128.4 kg (283 lb 1.1 oz)] 127.7 kg (281 lb 8.4 oz) (06/07 0400) Venti mask INTAKE / OUTPUT: Intake/Output     06/06 0701 - 06/07 0700 06/07 0701 - 06/08 0700   I.V. (mL/kg) 5265 (41.2)    IV Piggyback 1850    Total Intake(mL/kg) 7115 (55.7)    Urine (mL/kg/hr) 2450 (0.8)    Total Output 2450     Net +4665          Stool Occurrence  2 x    Emesis Occurrence 1 x      PHYSICAL EXAMINATION: General:  Obese WH nad  Neuro:  Intact HEENT:  No LAN Cardiovascular:  HSR RRR Lungs:  CTA Abdomen:  Tender, obese, +bs Musculoskeletal:  intact Skin:  Warm  LABS:  Recent Labs Lab 05/11/13 0409 05/11/13 1214 05/11/13 1235 05/12/13 0344  HGB 13.7  --   --  11.2*  WBC 11.9*  --   --  14.2*  PLT 238  --   --  151  NA 138  --   --  140  K 3.6  --   --  3.8  CL 99  --   --  105  CO2 23  --   --  27  GLUCOSE 168*  --   --  118*  BUN 23  --   --  21  CREATININE 1.10  --   --  1.28*  CALCIUM 10.2  --   --  8.0*  LATICACIDVEN  --   --  3.2*  --   PROCALCITON  --  51.74  --   --     Recent Labs Lab 05/11/13 1601 05/11/13 2052 05/11/13 2357 05/12/13 0345 05/12/13 0735  GLUCAP 144* 145* 169* 103* 127*    Imaging: Ct Abdomen Pelvis  Wo Contrast  05/11/2013   *RADIOLOGY REPORT*  Clinical Data: Bilateral flank pain, nausea and vomiting.  CT ABDOMEN AND PELVIS WITHOUT CONTRAST  Technique:  Multidetector CT imaging of the abdomen and pelvis was performed following the standard protocol without intravenous contrast.  Comparison: CT of the abdomen and pelvis performed 08/30/2007  Findings: The visualized lung bases are clear.  A small pericardial effusion is noted.  The liver and spleen are unremarkable in appearance.  The patient is status post cholecystectomy, with clips noted along the gallbladder fossa.  The pancreas and adrenal glands are unremarkable.  There is moderate right-sided hydronephrosis and moderately severe left-sided hydronephrosis.  On the left, this appears relatively chronic in nature, with marked dilatation of the renal calyces and thinning of the renal parenchyma.  On the right, this is relatively acute, with extensive surrounding perinephric stranding and fluid. Underlying pyelonephritis cannot be excluded, given the degree of soft tissue stranding.  On the left, this appears to reflect slight distal  migration of a 1.5 x 1.3 cm stone at the left renal pelvis, causing obstruction at the ureteropelvic junction.  On the right, this appears to reflect obstruction due to a 1.2 x 1.1 cm stone at the right renal pelvis, obstructing the right ureteropelvic junction.  Bilateral 9 mm nonobstructing stones are seen.  No significant left- sided perinephric stranding is appreciated.  The small bowel is unremarkable in appearance.  The stomach is within normal limits.  No acute vascular abnormalities are seen.  The patient is status post appendectomy.  The colon is largely decompressed.  Minimal diverticulosis is noted along the descending and proximal sigmoid colon, without evidence of diverticulitis.  The bladder is decompressed and not well assessed.  The uterus is grossly normal in size.  No suspicious adnexal masses are seen; the ovaries are grossly unremarkable in appearance.  No inguinal lymphadenopathy is seen.  No acute osseous abnormalities are identified.  IMPRESSION:  1.  Acute moderate right-sided hydronephrosis and relatively chronic appearing moderately severe left-sided hydronephrosis.  On the left, there is marked dilatation of the renal calyces and thinning of the renal parenchyma.  The relatively acute nature of the right-sided hydronephrosis is demonstrated by extensive surrounding perinephric stranding and fluid.  Underlying right-sided pyelonephritis cannot be excluded, given the degree of soft tissue stranding.  This reflects bilateral obstructing stones at the renal pelves, obstructing both ureteropelvic junctions.  The right-sided stone measures 1.2 x 1.1 cm, while the left-sided stone measures 1.5 x 1.3 cm.  Bilateral obstruction raises concern for impending renal failure; would consult for emergent intervention.  2.  Bilateral 9 mm nonobstructing stones seen. 3.  Minimal diverticulosis along the descending and proximal sigmoid colon, without evidence of diverticulitis. 4.  Small pericardial effusion  noted.  These results were called by telephone on 05/11/2013 at 04:31 a.m. to Dr. Brock Bad, who verbally acknowledged these results.   Original Report Authenticated By: Tonia Ghent, M.D.     ASSESSMENT / PLAN:  PULMONARY A:No acute issue P:   -monitor oxygenation  CARDIOVASCULAR A: Hx of HTN     Shock in setting of pyleonephritis -Off pressors P:  Fluid resuscitated, decrease to 75/h Hold lipitor, HCTZ, lisinopril  RENAL A:  Chronic renal dz      Bilateral stonesS/p Stents per Urology P:   Monitor creatinine Avoid nephrotoxins   GASTROINTESTINAL A:  GI protection Nutrition P:  Start PO    HEMATOLOGIC A:  No acute issue P:  F/u CBC  INFECTIOUS  A:  Pyelonephritis  P:   Cipro/ zosyn - simplify based on cx  ENDOCRINE A:  DM P:   Hold metformin, januvia SSI  NEUROLOGIC A:  Pain control Hx of anxiety, depression, fibromyalgia P:   Dc dilaudid -nausea Resume wellbutrin, celexa, lamictal when able to eat  TODAY'S SUMMARY:  Pyelonephritis with septic shock resolved, bilateral ureteral stones s/p  bilateral stent placement, with AKI.  Cyril Mourning MD. Tonny Bollman. [facility] Pager [phone] If no response call [phone]     05/12/2013, 8:21 AM

## 2013-05-13 LAB — URINE CULTURE

## 2013-05-13 LAB — GLUCOSE, CAPILLARY
Glucose-Capillary: 122 mg/dL — ABNORMAL HIGH (ref 70–99)
Glucose-Capillary: 141 mg/dL — ABNORMAL HIGH (ref 70–99)

## 2013-05-13 LAB — CBC
HCT: 30.4 % — ABNORMAL LOW (ref 36.0–46.0)
MCHC: 31.9 g/dL (ref 30.0–36.0)
Platelets: 147 10*3/uL — ABNORMAL LOW (ref 150–400)
RDW: 16.2 % — ABNORMAL HIGH (ref 11.5–15.5)
WBC: 11.3 10*3/uL — ABNORMAL HIGH (ref 4.0–10.5)

## 2013-05-13 LAB — BASIC METABOLIC PANEL
BUN: 15 mg/dL (ref 6–23)
Calcium: 7.6 mg/dL — ABNORMAL LOW (ref 8.4–10.5)
Creatinine, Ser: 1.05 mg/dL (ref 0.50–1.10)
GFR calc Af Amer: 71 mL/min — ABNORMAL LOW (ref >90)
GFR calc non Af Amer: 61 mL/min — ABNORMAL LOW (ref >90)

## 2013-05-13 LAB — PROCALCITONIN: Procalcitonin: 9.33 ng/mL

## 2013-05-13 MED ORDER — PHENAZOPYRIDINE HCL 200 MG PO TABS: 200.0000 mg | ORAL_TABLET | Freq: Three times a day (TID) | ORAL | Status: DC | PRN

## 2013-05-13 MED ORDER — TRAMADOL-ACETAMINOPHEN 37.5-325 MG PO TABS: 1.0000 | ORAL_TABLET | Freq: Four times a day (QID) | ORAL | Status: DC | PRN

## 2013-05-13 MED ORDER — CIPROFLOXACIN HCL 500 MG PO TABS: 500.0000 mg | ORAL_TABLET | Freq: Two times a day (BID) | ORAL | Status: DC

## 2013-05-13 MED ORDER — POTASSIUM CHLORIDE CRYS ER 20 MEQ PO TBCR
40.0000 meq | EXTENDED_RELEASE_TABLET | Freq: Once | ORAL | Status: AC
  Administered 2013-05-13: 40 meq via ORAL
  Filled 2013-05-13: qty 2

## 2013-05-13 NOTE — Progress Notes (Signed)
PULMONARY  / CRITICAL CARE MEDICINE  Name: Grace Singh MRN: [id number] DOB: [birth year]    ADMISSION DATE:  05/11/2013 CONSULTATION DATE:  Margretta Sidle  REFERRING MD :  Berneice Heinrich PRIMARY SERVICE: [name]  CHIEF COMPLAINT: Shock  BRIEF PATIENT DESCRIPTION:  47 yo female who presented to [facility] 6-6 with flank pain and dirty urine. CT revealed bilateral ureteral stones and she was transferred to [facility] and taken to OR for bilateral stent placement per Dr. Berneice Heinrich. She was noted to be hypotensive intra op and was placed on neo drip. Transferred to ICU on pressors and PCCM asked to help manage her care.    SIGNIFICANT EVENTS / STUDIES:  6-6 bilateral stent ureteral   LINES / TUBES:   CULTURES: 6-6 uc>>GNR >>  ANTIBIOTICS: 6-6 cipro>> 6-6 zoysn>>   SUBJECTIVE:  C/o headache improved, c/o nausea Afebrile Denies chest pain.  Breathing okay.    abdominal pain -improved  VITAL SIGNS: Temp:  [98.3 F (36.8 C)-99.6 F (37.6 C)] 99 F (37.2 C) (06/08 0757) Pulse Rate:  [80-104] 83 (06/08 0430) Resp:  [16-23] 19 (06/08 0000) BP: (130-159)/(65-96) 156/80 mmHg (06/08 0430) SpO2:  [93 %-98 %] 95 % (06/08 0000) Weight:  [127.8 kg (281 lb 12 oz)] 127.8 kg (281 lb 12 oz) (06/08 0400) Venti mask INTAKE / OUTPUT: Intake/Output     06/07 0701 - 06/08 0700 06/08 0701 - 06/09 0700   P.O. 120    I.V. (mL/kg) 1650 (12.9)    Other 315    IV Piggyback 287.5    Total Intake(mL/kg) 2372.5 (18.6)    Urine (mL/kg/hr) 2750 (0.9)    Stool 1 (0)    Total Output 2751     Net -378.5          Stool Occurrence 3 x      PHYSICAL EXAMINATION: General:  Obese WH nad  Neuro:  Intact HEENT:  No LAN Cardiovascular:  HSR RRR Lungs:  CTA Abdomen:  Non -Tender, obese, +bs Musculoskeletal:  intact Skin:  Warm  LABS:  Recent Labs Lab 05/11/13 0409 05/11/13 1214 05/11/13 1235 05/12/13 0344 05/13/13 0340  HGB 13.7  --   --  11.2* 9.7*  WBC 11.9*  --   --  14.2* 11.3*  PLT 238  --   --   151 147*  NA 138  --   --  140 137  K 3.6  --   --  3.8 3.4*  CL 99  --   --  105 103  CO2 23  --   --  27 26  GLUCOSE 168*  --   --  118* 167*  BUN 23  --   --  21 15  CREATININE 1.10  --   --  1.28* 1.05  CALCIUM 10.2  --   --  8.0* 7.6*  LATICACIDVEN  --   --  3.2*  --   --   PROCALCITON  --  51.74  --   --  9.33    Recent Labs Lab 05/12/13 1559 05/12/13 1941 05/13/13 0014 05/13/13 0350 05/13/13 0756  GLUCAP 115* 132* 122* 156* 135*    Imaging: No results found.   ASSESSMENT / PLAN:  PULMONARY A:No acute issue P:   -monitor oxygenation  CARDIOVASCULAR A: Hx of HTN     Shock in setting of pyleonephritis -Off pressors P:  Fluid resuscitated, dc Hold lipitor, HCTZ, lisinopril  RENAL A:  Chronic renal dz      Bilateral stonesS/p  Stents per Urology      Hypokalemia P:   Monitor creatinine Avoid nephrotoxins   GASTROINTESTINAL A:  GI protection Nutrition P:  advance PO    HEMATOLOGIC A:  No acute issue P:  F/u CBC  INFECTIOUS A:  Pyelonephritis  P:   Cipro/ zosyn - simplify in 24h based on cx  ENDOCRINE A:  DM P:   Hold metformin, januvia SSI  NEUROLOGIC A:  Pain control Hx of anxiety, depression, fibromyalgia P:   Dc dilaudid -nausea Resume wellbutrin, celexa, lamictal when able to eat  TODAY'S SUMMARY:  Pyelonephritis with septic shock resolved, bilateral ureteral stones s/p  bilateral stent placement, with AKI -resolving. Change Abx to oral in 24h priro to dc OK to transfer to floor  Cyril Mourning MD. FCCP. [place] [facility] Pager [phone] If no response call [phone]     05/13/2013, 8:06 AM

## 2013-05-13 NOTE — Progress Notes (Signed)
Pt D/C home, D/C instructions and medication administration instructions done, pt verbalizes understanding.

## 2013-05-13 NOTE — Progress Notes (Signed)
Urology Progress Note  2 Days Post-Op   Subjective: Klebsiella pneumonae sepsis, Sensitive to Cipro, now AF, and feeling normal. Transfetted to floor this AM, and wanting Discharge today.      No acute urologic events overnight. Ambulation:   positive Flatus:    positive Bowel movement  negative  Pain: complete resolution  Objective:  Blood pressure 140/75, pulse 82, temperature 98.9 F (37.2 C), temperature source Oral, resp. rate 18, height 5\' 6"  (1.676 m), weight 127.8 kg (281 lb 12 oz), last menstrual period 05/02/2013, SpO2 98.00%.  Physical Exam:  General:  No acute distress, awake Resp: clear to auscultation bilaterally Extremities: extremities normal, atraumatic, no cyanosis or edema Genitourinary:   Normal BUS Foley: out    I/O last 3 completed shifts: In: 4182.5 [P.O.:120; I.V.:3025; Other:450; IV Piggyback:587.5] Out: 4551 [Urine:4550; Stool:1]  Recent Labs     05/12/13  0344  05/13/13  0340  HGB  11.2*  9.7*  WBC  14.2*  11.3*  PLT  151  147*    Recent Labs     05/12/13  0344  05/13/13  0340  NA  140  137  K  3.8  3.4*  CL  105  103  CO2  27  26  BUN  21  15  CREATININE  1.28*  1.05  CALCIUM  8.0*  7.6*  GFRNONAA  48*  61*  GFRAA  56*  71*     No results found for this basename: PT, INR, APTT,  in the last 72 hours   No components found with this basename: ABG,   Assessment/Plan:  Pt looks normal this afternoon, and wants discharge. She has Klebsiella, sensitibe to Cipro. Will allow d/c on Ciprom, an dfollow-up with Dr. Berneice Heinrich  This week.   Continue any current medications. RTC Dr. Berneice Heinrich.

## 2013-05-13 NOTE — Progress Notes (Signed)
Nutrition Brief Note  Patient identified on the Malnutrition Screening Tool (MST) Report  Body mass index is 45.5 kg/(m^2). Patient meets criteria for Morbid obesity based on current BMI.   Current diet order is heart healthy. Labs and medications reviewed.   Pt, husband, and RN reported no significant weight loss. Suspect inaccurate MST.   No nutrition interventions warranted at this time. If nutrition issues arise, please consult RD.   Ebbie Latus RD, LDN

## 2013-05-14 ENCOUNTER — Encounter (HOSPITAL_COMMUNITY): Payer: Self-pay | Admitting: Urology

## 2013-05-24 ENCOUNTER — Other Ambulatory Visit: Payer: Self-pay | Admitting: Urology

## 2013-05-24 ENCOUNTER — Other Ambulatory Visit (HOSPITAL_COMMUNITY): Payer: Self-pay | Admitting: Urology

## 2013-05-24 DIAGNOSIS — N261 Atrophy of kidney (terminal): Secondary | ICD-10-CM

## 2013-05-29 ENCOUNTER — Ambulatory Visit (HOSPITAL_COMMUNITY)
Admission: RE | Admit: 2013-05-29 | Discharge: 2013-05-29 | Disposition: A | Discharge status: Home / Self Care | Payer: Medicare Other | Source: Ambulatory Visit | Attending: Urology | Admitting: Urology

## 2013-05-29 DIAGNOSIS — N261 Atrophy of kidney (terminal): Secondary | ICD-10-CM

## 2013-05-29 DIAGNOSIS — N1330 Unspecified hydronephrosis: Secondary | ICD-10-CM | POA: Insufficient documentation

## 2013-05-29 MED ORDER — TECHNETIUM TC 99M MERTIATIDE
14.8000 | Freq: Once | INTRAVENOUS | Status: AC | PRN
  Administered 2013-05-29: 15 via INTRAVENOUS

## 2013-05-29 MED ORDER — FUROSEMIDE 10 MG/ML IJ SOLN
60.0000 mg | Freq: Once | INTRAMUSCULAR | Status: AC
  Administered 2013-05-29: 60 mg via INTRAVENOUS
  Filled 2013-05-29: qty 6

## 2013-06-05 ENCOUNTER — Encounter (HOSPITAL_BASED_OUTPATIENT_CLINIC_OR_DEPARTMENT_OTHER): Payer: Self-pay | Admitting: *Deleted

## 2013-06-05 NOTE — Progress Notes (Addendum)
NPO AFTER MN. ARRIVES AT 1000.  NEEDS ISTAT 8 AND URINE PREG. WILL TAKE WELLBUTRIN, LIPITOR, AND LAMICTAL AM OF SURG W/ SIP OF WATER AND IF NEEDED ONE OF THE RX PAIN MED.  CURRENT EKG W/ CHART,  ASK MDA IF NEEDED REPEATED.

## 2013-06-07 NOTE — Discharge Summary (Addendum)
Physician Discharge Summary  Patient ID: Grace Singh MRN: [id number] DOB/AGE: 1966-08-08 47 y.o.  Admit date: 05/11/2013 Discharge date: 06/07/2013  Admission Diagnoses: Bilateral Ureteral Stones + Urosepsis  Discharge Diagnoses: Bilateral Ureteral Stones + Urosepsis Active Problems:   Septic shock   Acute pyelonephritis   Discharged Condition: good  Hospital Course: Pt underwent emergent cysto and bilateral stents after ER evaluation 05/11/2013 worrisome for urosepsis and bilateral obstructing stones. She was admitted to ICU post-op on pressors and IV ABX for critical care. Pt quickly improved on empiric ABX therapy and pressors weaned to off. By 6/8, the day of discharge, her culture revealed klebsiella sensitive to cipro to which she was transitioned, she was afebrile, tolerating diet, voiding to completion, and felt to be adequate for discharge with plan for separate elective procedure to definitively address her stones.   Consults: pulmonary/intensive care  Significant Diagnostic Studies: microbiology: Klebsiella as per above  Treatments: antibiotics: vancomycin and Zosyn and surgery:  emergent cysto and bilateral stents after ER evaluation 05/11/2013   Discharge Exam: Blood pressure 140/75, pulse 82, temperature 98.9 F (37.2 C), temperature source Oral, resp. rate 18, height 5\' 6"  (1.676 m), weight 127.8 kg (281 lb 12 oz), last menstrual period 05/02/2013, SpO2 98.00%. Please see physical exam documented by Dr. Patsi Sears on 05/13/13  Disposition: 01-Home or Self Care  Discharge Orders   Future Orders Complete By Expires     Diet - low sodium heart healthy  As directed     Discharge instructions  As directed     Comments:      Call Dr. Rosita Kea for follow-up, [phone]    Discharge patient  As directed     Discontinue IV  As directed     Increase activity slowly  As directed         Medication List         atorvastatin 80 MG tablet  Commonly known as:  LIPITOR  Take 40  mg by mouth every morning.     buPROPion 75 MG tablet  Commonly known as:  WELLBUTRIN  Take 75 mg by mouth 2 (two) times daily.     citalopram 40 MG tablet  Commonly known as:  CELEXA  Take 40 mg by mouth daily.     ferrous sulfate 325 (65 FE) MG tablet  Take 325 mg by mouth 2 (two) times daily with a meal.     hydrochlorothiazide 25 MG tablet  Commonly known as:  HYDRODIURIL  Take 12.5 mg by mouth daily.     lamoTRIgine 200 MG tablet  Commonly known as:  LAMICTAL  Take 100-200 mg by mouth 2 (two) times daily. Takes 200 mg in am and 100 mg in pm     lisinopril 20 MG tablet  Commonly known as:  PRINIVIL,ZESTRIL  Take 20 mg by mouth daily.     LORazepam 0.5 MG tablet  Commonly known as:  ATIVAN  Take 0.5 mg by mouth every 8 (eight) hours as needed for anxiety.     metFORMIN 1000 MG tablet  Commonly known as:  GLUCOPHAGE  Take 1,000 mg by mouth 2 (two) times daily with a meal.     omega-3 acid ethyl esters 1 G capsule  Commonly known as:  LOVAZA  Take 1 g by mouth 2 (two) times daily.     oxyCODONE-acetaminophen 7.5-325 MG per tablet  Commonly known as:  PERCOCET  Take 1 tablet by mouth 2 (two) times daily.     sitaGLIPtin 50  MG tablet  Commonly known as:  JANUVIA  Take 50 mg by mouth daily.     Vitamin D (Ergocalciferol) 50000 UNITS Caps  Commonly known as:  DRISDOL  Take 50,000 Units by mouth every 7 (seven) days. Sunday           Follow-up Information   Call Sebastian Ache, MD.   Contact information:   [address] [place] [address] [phone]       Signed: Sebastian Ache 06/07/2013, 2:56 PM  Date of Discharge is 05/13/2013. NOT 7/3 as epr above.

## 2013-06-13 ENCOUNTER — Encounter (HOSPITAL_BASED_OUTPATIENT_CLINIC_OR_DEPARTMENT_OTHER): Payer: Self-pay | Admitting: Anesthesiology

## 2013-06-13 ENCOUNTER — Encounter (HOSPITAL_BASED_OUTPATIENT_CLINIC_OR_DEPARTMENT_OTHER)
Admission: RE | Disposition: A | Discharge status: Home / Self Care | Payer: Self-pay | Source: Ambulatory Visit | Attending: Urology

## 2013-06-13 ENCOUNTER — Ambulatory Visit (HOSPITAL_BASED_OUTPATIENT_CLINIC_OR_DEPARTMENT_OTHER): Payer: Medicare Other | Admitting: Anesthesiology

## 2013-06-13 ENCOUNTER — Ambulatory Visit (HOSPITAL_BASED_OUTPATIENT_CLINIC_OR_DEPARTMENT_OTHER)
Admission: RE | Admit: 2013-06-13 | Discharge: 2013-06-13 | Disposition: A | Discharge status: Home / Self Care | Payer: Medicare Other | Source: Ambulatory Visit | Attending: Urology | Admitting: Urology

## 2013-06-13 ENCOUNTER — Ambulatory Visit (HOSPITAL_COMMUNITY): Payer: Medicare Other

## 2013-06-13 DIAGNOSIS — N200 Calculus of kidney: Secondary | ICD-10-CM | POA: Insufficient documentation

## 2013-06-13 DIAGNOSIS — E119 Type 2 diabetes mellitus without complications: Secondary | ICD-10-CM | POA: Insufficient documentation

## 2013-06-13 DIAGNOSIS — IMO0001 Reserved for inherently not codable concepts without codable children: Secondary | ICD-10-CM | POA: Insufficient documentation

## 2013-06-13 DIAGNOSIS — N183 Chronic kidney disease, stage 3 unspecified: Secondary | ICD-10-CM | POA: Insufficient documentation

## 2013-06-13 DIAGNOSIS — I129 Hypertensive chronic kidney disease with stage 1 through stage 4 chronic kidney disease, or unspecified chronic kidney disease: Secondary | ICD-10-CM | POA: Insufficient documentation

## 2013-06-13 DIAGNOSIS — N201 Calculus of ureter: Secondary | ICD-10-CM | POA: Insufficient documentation

## 2013-06-13 DIAGNOSIS — E785 Hyperlipidemia, unspecified: Secondary | ICD-10-CM | POA: Insufficient documentation

## 2013-06-13 HISTORY — DX: Iron deficiency anemia, unspecified: D50.9

## 2013-06-13 HISTORY — DX: Other asthma: J45.998

## 2013-06-13 HISTORY — PX: HOLMIUM LASER APPLICATION: SHX5852

## 2013-06-13 HISTORY — PX: CYSTOSCOPY W/ URETERAL STENT PLACEMENT: SHX1429

## 2013-06-13 HISTORY — DX: Hyperlipidemia, unspecified: E78.5

## 2013-06-13 HISTORY — DX: Type 2 diabetes mellitus without complications: E11.9

## 2013-06-13 HISTORY — DX: Chronic kidney disease, stage 3 (moderate): N18.3

## 2013-06-13 HISTORY — PX: CYSTOSCOPY WITH RETROGRADE PYELOGRAM, URETEROSCOPY AND STENT PLACEMENT: SHX5789

## 2013-06-13 HISTORY — DX: Unspecified osteoarthritis, unspecified site: M19.90

## 2013-06-13 HISTORY — DX: Reserved for inherently not codable concepts without codable children: IMO0001

## 2013-06-13 HISTORY — DX: Atrophy of kidney (terminal): N26.1

## 2013-06-13 HISTORY — DX: Chronic kidney disease, stage 3 unspecified: N18.30

## 2013-06-13 HISTORY — DX: Presence of spectacles and contact lenses: Z97.3

## 2013-06-13 HISTORY — DX: Personal history of other infectious and parasitic diseases: Z86.19

## 2013-06-13 LAB — POCT I-STAT, CHEM 8
Calcium, Ion: 1.14 mmol/L (ref 1.12–1.23)
Creatinine, Ser: 1 mg/dL (ref 0.50–1.10)
Glucose, Bld: 148 mg/dL — ABNORMAL HIGH (ref 70–99)
HCT: 40 % (ref 36.0–46.0)
Hemoglobin: 13.6 g/dL (ref 12.0–15.0)

## 2013-06-13 LAB — GLUCOSE, CAPILLARY: Glucose-Capillary: 130 mg/dL — ABNORMAL HIGH (ref 70–99)

## 2013-06-13 SURGERY — CYSTOURETEROSCOPY, WITH RETROGRADE PYELOGRAM AND STENT INSERTION
Anesthesia: General | Site: Ureter | Laterality: Right

## 2013-06-13 MED ORDER — ROCURONIUM BROMIDE 100 MG/10ML IV SOLN
INTRAVENOUS | Status: DC | PRN
  Administered 2013-06-13: 30 mg via INTRAVENOUS

## 2013-06-13 MED ORDER — ONDANSETRON HCL 4 MG/2ML IJ SOLN
INTRAMUSCULAR | Status: DC | PRN
  Administered 2013-06-13: 4 mg via INTRAVENOUS

## 2013-06-13 MED ORDER — MIDAZOLAM HCL 5 MG/5ML IJ SOLN
INTRAMUSCULAR | Status: DC | PRN
  Administered 2013-06-13: 2 mg via INTRAVENOUS

## 2013-06-13 MED ORDER — GLYCOPYRROLATE 0.2 MG/ML IJ SOLN
INTRAMUSCULAR | Status: DC | PRN
  Administered 2013-06-13: 0.6 mg via INTRAVENOUS

## 2013-06-13 MED ORDER — HYDROMORPHONE HCL PF 1 MG/ML IJ SOLN
0.2500 mg | INTRAMUSCULAR | Status: DC | PRN
  Filled 2013-06-13: qty 1

## 2013-06-13 MED ORDER — GENTAMICIN IN SALINE 1.6-0.9 MG/ML-% IV SOLN
80.0000 mg | INTRAVENOUS | Status: AC
  Administered 2013-06-13: 400 mg via INTRAVENOUS
  Filled 2013-06-13: qty 50

## 2013-06-13 MED ORDER — LIDOCAINE HCL 4 % MT SOLN
OROMUCOSAL | Status: DC | PRN
  Administered 2013-06-13: 4 mL via TOPICAL

## 2013-06-13 MED ORDER — OXYCODONE-ACETAMINOPHEN 7.5-325 MG PO TABS: 1.0000 | ORAL_TABLET | Freq: Four times a day (QID) | ORAL | Status: DC | PRN

## 2013-06-13 MED ORDER — LIDOCAINE HCL (CARDIAC) 20 MG/ML IV SOLN
INTRAVENOUS | Status: DC | PRN
  Administered 2013-06-13: 80 mg via INTRAVENOUS

## 2013-06-13 MED ORDER — METOCLOPRAMIDE HCL 5 MG/ML IJ SOLN
INTRAMUSCULAR | Status: DC | PRN
  Administered 2013-06-13: 10 mg via INTRAVENOUS

## 2013-06-13 MED ORDER — SODIUM CHLORIDE 0.9 % IR SOLN
Status: DC | PRN
  Administered 2013-06-13: 6000 mL

## 2013-06-13 MED ORDER — LACTATED RINGERS IV SOLN
INTRAVENOUS | Status: DC
  Filled 2013-06-13: qty 1000

## 2013-06-13 MED ORDER — PROMETHAZINE HCL 12.5 MG PO TABS: 25.0000 mg | ORAL_TABLET | Freq: Four times a day (QID) | ORAL | Status: DC | PRN

## 2013-06-13 MED ORDER — DEXAMETHASONE SODIUM PHOSPHATE 4 MG/ML IJ SOLN
INTRAMUSCULAR | Status: DC | PRN
  Administered 2013-06-13: 4 mg via INTRAVENOUS

## 2013-06-13 MED ORDER — PROPOFOL 10 MG/ML IV BOLUS
INTRAVENOUS | Status: DC | PRN
  Administered 2013-06-13: 200 mg via INTRAVENOUS

## 2013-06-13 MED ORDER — IOHEXOL 350 MG/ML SOLN
INTRAVENOUS | Status: DC | PRN
  Administered 2013-06-13: 20 mL

## 2013-06-13 MED ORDER — NEOSTIGMINE METHYLSULFATE 1 MG/ML IJ SOLN
INTRAMUSCULAR | Status: DC | PRN
  Administered 2013-06-13: 4 mg via INTRAVENOUS

## 2013-06-13 MED ORDER — SUCCINYLCHOLINE CHLORIDE 20 MG/ML IJ SOLN
INTRAMUSCULAR | Status: DC | PRN
  Administered 2013-06-13: 120 mg via INTRAVENOUS

## 2013-06-13 MED ORDER — FENTANYL CITRATE 0.05 MG/ML IJ SOLN
INTRAMUSCULAR | Status: DC | PRN
  Administered 2013-06-13 (×3): 50 ug via INTRAVENOUS

## 2013-06-13 MED ORDER — LACTATED RINGERS IV SOLN
INTRAVENOUS | Status: DC
  Administered 2013-06-13: 11:00:00 via INTRAVENOUS
  Filled 2013-06-13: qty 1000

## 2013-06-13 MED ORDER — SENNA-DOCUSATE SODIUM 8.6-50 MG PO TABS: 1.0000 | ORAL_TABLET | Freq: Two times a day (BID) | ORAL | Status: DC

## 2013-06-13 MED ORDER — PROMETHAZINE HCL 25 MG/ML IJ SOLN
6.2500 mg | INTRAMUSCULAR | Status: DC | PRN
  Filled 2013-06-13: qty 1

## 2013-06-13 MED ORDER — LACTATED RINGERS IV SOLN
INTRAVENOUS | Status: DC | PRN
  Administered 2013-06-13: 13:00:00 via INTRAVENOUS
  Administered 2013-06-13: 1000 mL
  Administered 2013-06-13: 11:00:00 via INTRAVENOUS

## 2013-06-13 MED ORDER — PROMETHAZINE HCL 25 MG/ML IJ SOLN
12.5000 mg | Freq: Once | INTRAMUSCULAR | Status: AC
  Administered 2013-06-13: 12.5 mg via INTRAVENOUS
  Filled 2013-06-13: qty 1

## 2013-06-13 SURGICAL SUPPLY — 48 items
ADAPTER CATH URET PLST 4-6FR (CATHETERS) ×1 IMPLANT
ADPR CATH URET STRL DISP 4-6FR (CATHETERS) ×1
BAG DRAIN URO-CYSTO SKYTR STRL (DRAIN) ×2 IMPLANT
BAG DRN UROCATH (DRAIN) ×1
BAG URO CATCHER STRL LF (DRAPE) ×2 IMPLANT
BASKET LASER NITINOL 1.9FR (BASKET) ×2 IMPLANT
BASKET STNLS GEMINI 4WIRE 3FR (BASKET) IMPLANT
BASKET ZERO TIP NITINOL 2.4FR (BASKET) IMPLANT
BRUSH URET BIOPSY 3F (UROLOGICAL SUPPLIES) IMPLANT
BSKT STON RTRVL 120 1.9FR (BASKET) ×1
BSKT STON RTRVL GEM 120X11 3FR (BASKET)
BSKT STON RTRVL ZERO TP 2.4FR (BASKET)
CANISTER SUCT LVC 12 LTR MEDI- (MISCELLANEOUS) ×1 IMPLANT
CATH FOLEY 2WAY  3CC  8FR (CATHETERS)
CATH FOLEY 2WAY 3CC 8FR (CATHETERS) IMPLANT
CATH INTERMIT  6FR 70CM (CATHETERS) ×1 IMPLANT
CATH URET 5FR 28IN CONE TIP (BALLOONS)
CATH URET 5FR 28IN OPEN ENDED (CATHETERS) IMPLANT
CATH URET 5FR 70CM CONE TIP (BALLOONS) IMPLANT
CLOTH BEACON ORANGE TIMEOUT ST (SAFETY) ×2 IMPLANT
DRAPE CAMERA CLOSED 9X96 (DRAPES) ×2 IMPLANT
ELECT REM PT RETURN 9FT ADLT (ELECTROSURGICAL)
ELECTRODE REM PT RTRN 9FT ADLT (ELECTROSURGICAL) IMPLANT
FIBER LASER TRAC TIP (UROLOGICAL SUPPLIES) ×1 IMPLANT
GLOVE BIO SURGEON STRL SZ7.5 (GLOVE) ×2 IMPLANT
GLOVE BIOGEL M 6.5 STRL (GLOVE) ×1 IMPLANT
GLOVE BIOGEL M 7.0 STRL (GLOVE) ×1 IMPLANT
GLOVE BIOGEL M STER SZ 6 (GLOVE) ×1 IMPLANT
GLOVE BIOGEL PI IND STRL 7.0 (GLOVE) IMPLANT
GLOVE BIOGEL PI INDICATOR 7.0 (GLOVE) ×1
GOWN PREVENTION PLUS LG XLONG (DISPOSABLE) ×4 IMPLANT
GOWN STRL REIN XL XLG (GOWN DISPOSABLE) ×2 IMPLANT
GUIDEWIRE 0.038 PTFE COATED (WIRE) IMPLANT
GUIDEWIRE ANG ZIPWIRE 038X150 (WIRE) ×3 IMPLANT
GUIDEWIRE STR DUAL SENSOR (WIRE) ×3 IMPLANT
IV NS IRRIG 3000ML ARTHROMATIC (IV SOLUTION) ×4 IMPLANT
KIT BALLIN UROMAX 15FX10 (LABEL) IMPLANT
KIT BALLN UROMAX 15FX4 (MISCELLANEOUS) IMPLANT
KIT BALLN UROMAX 26 75X4 (MISCELLANEOUS)
PACK CYSTOSCOPY (CUSTOM PROCEDURE TRAY) ×2 IMPLANT
SET HIGH PRES BAL DIL (LABEL)
SHEATH ACCESS URETERAL 24CM (SHEATH) ×1 IMPLANT
SHEATH URET ACCESS 12FR/35CM (UROLOGICAL SUPPLIES) IMPLANT
SHEATH URET ACCESS 12FR/55CM (UROLOGICAL SUPPLIES) IMPLANT
STENT URET 6FRX24 CONTOUR (STENTS) ×1 IMPLANT
SYRINGE 10CC LL (SYRINGE) ×2 IMPLANT
SYRINGE IRR TOOMEY STRL 70CC (SYRINGE) IMPLANT
TUBE FEEDING 8FR 16IN STR KANG (MISCELLANEOUS) ×1 IMPLANT

## 2013-06-13 NOTE — Anesthesia Procedure Notes (Signed)
Procedure Name: Intubation Date/Time: 06/13/2013 12:42 PM Performed by: Norva Pavlov Pre-anesthesia Checklist: Patient identified, Emergency Drugs available, Suction available and Patient being monitored Patient Re-evaluated:Patient Re-evaluated prior to inductionOxygen Delivery Method: Circle System Utilized Preoxygenation: Pre-oxygenation with 100% oxygen Intubation Type: IV induction Ventilation: Mask ventilation without difficulty Laryngoscope Size: Mac and 4 Grade View: Grade I Tube type: Oral Tube size: 7.0 mm Number of attempts: 1 Airway Equipment and Method: stylet,  oral airway and LTA kit utilized Placement Confirmation: ETT inserted through vocal cords under direct vision,  positive ETCO2 and breath sounds checked- equal and bilateral Secured at: 21 cm Tube secured with: Tape Dental Injury: Teeth and Oropharynx as per pre-operative assessment

## 2013-06-13 NOTE — Anesthesia Preprocedure Evaluation (Signed)
Anesthesia Evaluation  Patient identified by MRN, date of birth, ID band Patient awake    Reviewed: Allergy & Precautions, H&P , NPO status , Patient's Chart, lab work & pertinent test results  Airway Mallampati: III TM Distance: <3 FB Neck ROM: Full    Dental no notable dental hx. (+) Teeth Intact and Caps,    Pulmonary asthma ,  breath sounds clear to auscultation  + decreased breath sounds      Cardiovascular hypertension, Pt. on medications Rhythm:Regular Rate:Normal     Neuro/Psych Anxiety Depression  Neuromuscular disease negative neurological ROS  negative psych ROS   GI/Hepatic negative GI ROS, Neg liver ROS,   Endo/Other  diabetes, Well Controlled, Type 2, Oral Hypoglycemic AgentsMorbid obesity  Renal/GU Renal InsufficiencyRenal disease  negative genitourinary   Musculoskeletal negative musculoskeletal ROS (+) Fibromyalgia -  Abdominal   Peds negative pediatric ROS (+)  Hematology negative hematology ROS (+)   Anesthesia Other Findings   Reproductive/Obstetrics negative OB ROS                           Anesthesia Physical Anesthesia Plan  ASA: II  Anesthesia Plan: General   Post-op Pain Management:    Induction:   Airway Management Planned: Oral ETT  Additional Equipment:   Intra-op Plan:   Post-operative Plan: Extubation in OR  Informed Consent: I have reviewed the patients History and Physical, chart, labs and discussed the procedure including the risks, benefits and alternatives for the proposed anesthesia with the patient or authorized representative who has indicated his/her understanding and acceptance.   Dental advisory given  Plan Discussed with: CRNA  Anesthesia Plan Comments:         Anesthesia Quick Evaluation

## 2013-06-13 NOTE — H&P (Signed)
Grace Singh is an 47 y.o. female.    Chief Complaint: Pre-Op Bilateral 1st Stage Ureteroscopic Stone Manipulation  HPI:   1- Bilateral Ureteral Stones, h/o urosepsis - S/p emergent cysto and bilateral stents 05/11/13 for bilateral UPJ stones, Lt 12mm and Rt 12mm + 7mm lower pole.  No piror stone treatment. Has known of left stone for sometime.   2 - Left Renal Atrophy - Incidetnal left renal atrophy by CT 05/2013. No funcitonal imaging prior, likely 2/2 chronic obstruction from stones as per above. Renogram 05/2013 with 33% Left / 58% Rt relative funciton.  3 - UTI / Urosepsis - Pt wtih pan-sensitive Klebsiella at time of urosepsis presentaiton 05/2013. Has been on Cipro (CX-specific) to finish course. Most recent UCX at [facility] office negative.   PMH sig for morbid obesiy, DM2, back sugrery / chronic cocyx pain, appy, GB,  Asthma  Today Grace Singh is seen to proceed with first stage ureteroscopic stone manipulation to address her multifocal large volume stones. No interval fevers.   Past Medical History  Diagnosis Date  . Hypertension   . Anxiety   . Depression   . Fibromyalgia   . Hyperlipidemia   . Seasonal asthma   . Diabetes mellitus, type 2   . CKD (chronic kidney disease), stage III   . Wears glasses   . Arthritis     TAILBONE AREA  . Iron deficiency anemia   . Bilateral hydronephrosis   . History of sepsis     05-11-2013  W/ UROSEPSIS---  RESOLIVED  . Normal cardiac stress test     PER PT 2007  . Atrophic kidney     LEFT    Past Surgical History  Procedure Laterality Date  . Coccyx removal  2009  . Hysteroscopy w/d&c N/A 04/20/2013    Procedure: DILATATION AND CURETTAGE /HYSTEROSCOPY;  Surgeon: Zelphia Cairo, MD;  Location: [facility];  Service: Gynecology;  Laterality: N/A;  . Polypectomy N/A 04/20/2013    Procedure: POLYPECTOMY;  Surgeon: Zelphia Cairo, MD;  Location: [facility];  Service: Gynecology;  Laterality: N/A;  . Cystoscopy with stent placement Bilateral  05/11/2013    Procedure: CYSTOSCOPY WITH STENT PLACEMENT;  Surgeon: Sebastian Ache, MD;  Location: [facility];  Service: Urology;  Laterality: Bilateral;  . Laparoscopic cholecystectomy  AGE 2  . Appendectomy  AGE 42    History reviewed. No pertinent family history. Social History:  reports that she has never smoked. She has never used smokeless tobacco. She reports that she does not drink alcohol or use illicit drugs.  Allergies: No Known Allergies  No prescriptions prior to admission    No results found for this or any previous visit (from the past 48 hour(s)). No results found.  Review of Systems  Constitutional: Negative.  Negative for fever, chills and malaise/fatigue.  HENT: Negative.   Eyes: Negative.   Respiratory: Negative.   Cardiovascular: Negative.   Gastrointestinal: Negative.   Genitourinary: Positive for frequency. Negative for hematuria.  Musculoskeletal: Negative.   Skin: Negative.   Neurological: Negative.   Endo/Heme/Allergies: Negative.   Psychiatric/Behavioral: Negative.     Height 5\' 6"  (1.676 m), weight 114.306 kg (252 lb), last menstrual period 04/05/2013. Physical Exam  Constitutional: She is oriented to person, place, and time. She appears well-developed and well-nourished.  obese  HENT:  Head: Normocephalic and atraumatic.  Eyes: EOM are normal. Pupils are equal, round, and reactive to light.  Neck: Normal range of motion. Neck supple.  Cardiovascular: Normal rate.   Respiratory:  Effort normal and breath sounds normal.  GI: Soft. Bowel sounds are normal.  No CVAT  Genitourinary: Vagina normal.  Musculoskeletal: Normal range of motion.  Neurological: She is alert and oriented to person, place, and time.  Skin: Skin is warm and dry.  Psychiatric: She has a normal mood and affect. Her behavior is normal. Judgment and thought content normal.     Assessment/Plan  1- Bilateral Ureteral Stones, h/o urosepsis -   We re-discussed ureteroscopic stone  manipulation with basketing and laser-lithotripsy in detail.  We rediscussed risks including bleeding, infection, damage to kidney / ureter  bladder, rarely loss of kidney. We rediscussed anesthetic risks and rare but serious surgical complications including DVT, PE, MI, and mortality. We specifically addressed that in her case we would plan on staged approach with first stage today and second stage on 7/16. The patient voiced understanding and wises to proceed.   2 - Left Renal Atrophy - Renogram with compromised but meaningful function, will proceed with stone removal to help minimize future decline.  3 - UTI / Urosepsis - Resolved by most recent CX's and clinically. Removing stone today should help reduce future infectious risk.  Iden Stripling 06/13/2013, 6:23 AM

## 2013-06-13 NOTE — Brief Op Note (Signed)
06/13/2013  2:04 PM  PATIENT:  Grace Singh  47 y.o. female  PRE-OPERATIVE DIAGNOSIS:  BILATERAL LARGE RENAL STONES  POST-OPERATIVE DIAGNOSIS:  BILATERAL LARGE RENAL STONES  PROCEDURE:  Procedure(s): First Stage Bilateral Ureteroscopy (Right) HOLMIUM LASER APPLICATION (Right) CYSTOSCOPY WITH STENT REPLACEMENT (Right)  SURGEON:  Surgeon(s) and Role:    * Sebastian Ache, MD - Primary  PHYSICIAN ASSISTANT:   ASSISTANTS: none   ANESTHESIA:   general  EBL:  Total I/O In: 2000 [I.V.:2000] Out: -   BLOOD ADMINISTERED:none  DRAINS: none   LOCAL MEDICATIONS USED:  NONE  SPECIMEN:  Source of Specimen:  Rt Renal Stones  DISPOSITION OF SPECIMEN:  [facility] for Compositional Analysis  COUNTS:  YES  TOURNIQUET:  * No tourniquets in log *  DICTATION: .Other Dictation: Dictation Number  [id number]  PLAN OF CARE: Discharge to home after PACU  PATIENT DISPOSITION:  PACU - hemodynamically stable.   Delay start of Pharmacological VTE agent (>24hrs) due to surgical blood loss or risk of bleeding: not applicable

## 2013-06-13 NOTE — Progress Notes (Signed)
Vomited x 2, once in bathroom in commode and 50 ml noted in emesis pan, reported to Dr. Rica Mast.

## 2013-06-13 NOTE — Anesthesia Postprocedure Evaluation (Signed)
  Anesthesia Post-op Note  Patient: MALIA CORSI  Procedure(s) Performed: Procedure(s) (LRB): First Stage Bilateral Ureteroscopy (Right) HOLMIUM LASER APPLICATION, right ureteral stone (Right) CYSTOSCOPY WITH STENT REPLACEMENT, right (Right)  Patient Location: PACU  Anesthesia Type: General  Level of Consciousness: awake and alert   Airway and Oxygen Therapy: Patient Spontanous Breathing  Post-op Pain: mild  Post-op Assessment: Post-op Vital signs reviewed, Patient's Cardiovascular Status Stable, Respiratory Function Stable, Patent Airway and No signs of Nausea or vomiting  Last Vitals:  Filed Vitals:   06/13/13 1515  BP: 147/73  Pulse: 74  Temp:   Resp: 16    Post-op Vital Signs: stable   Complications: No apparent anesthesia complications

## 2013-06-13 NOTE — Transfer of Care (Signed)
Immediate Anesthesia Transfer of Care Note  Patient: Grace Singh  Procedure(s) Performed: Procedure(s) (LRB): First Stage Bilateral Ureteroscopy (Right) HOLMIUM LASER APPLICATION (Right) CYSTOSCOPY WITH STENT REPLACEMENT (Right)  Patient Location: PACU  Anesthesia Type: General  Level of Consciousness: awake, alert  and oriented  Airway & Oxygen Therapy: Patient Spontanous Breathing and Patient connected to face mask oxygen  Post-op Assessment: Report given to PACU RN and Post -op Vital signs reviewed and stable  Post vital signs: Reviewed and stable  Complications: No apparent anesthesia complications

## 2013-06-14 ENCOUNTER — Encounter (HOSPITAL_BASED_OUTPATIENT_CLINIC_OR_DEPARTMENT_OTHER): Payer: Self-pay | Admitting: Urology

## 2013-06-14 NOTE — Progress Notes (Signed)
SPOKE W/ PT TODAY . DID POST OP CALL AS WELL AS PRE CALL FOR 06-20-2013 VISIT WITH [place]. NPO AFTER MN. ARRIVES AT 0830. WILL TAKE WELLBUTRIN, LIPITOR, AND LAMICTAL AM OF SURG W/ SIPS OF WATER. CURRENT LAB WORK DONE 06-13-2013.

## 2013-06-14 NOTE — Op Note (Signed)
NAMESHURONDA, Grace Singh              ACCOUNT NO.:  [id number]  MEDICAL RECORD NO.:  [id number]  LOCATION:                                 FACILITY:  PHYSICIAN:  Sebastian Ache, MD     DATE OF BIRTH:  02-01-1966  DATE OF PROCEDURE: 06/13/2013 DATE OF DISCHARGE:                              OPERATIVE REPORT   PREOPERATIVE DIAGNOSIS:  Large volume right greater than left bilateral renal stones, history of urosepsis.  PROCEDURE: 1. Cystoscopy with right retrograde pyelogram interpretation. 2. Exchange of right ureteral stent, 6 x 24, no tether. 3. Right first stage ureteroscopic stone manipulation.  ESTIMATED BLOOD LOSS:  Nil.  FINDINGS:  Large volume multifocal right intrarenal stones.  Over 95% of this was removed today.  ASSESSMENT:  Right renal stone fragments for compositional analysis.  INDICATION:  Mr. Holstein is a pleasant 47 year old lady with known history of nephrolithiasis who [facility] with bilateral obstructing ureteral stones, acute renal failure, and urosepsis.  She underwent temporizing with bilateral ureteral stents with plan for later definitive management.  She had known left renal atrophy.  Renogram revealed that it did have a 33% relative function. Given these findings, options were discussed including a staged ureteroscopic approach versus bilateral percutaneous approach and she wished to proceed with the ureteroscopy.  Informed consent was obtained and placed in medical record.  PROCEDURE IN DETAIL:  The patient being Grace Singh, procedure being right first stage ureteroscopic stone manipulation was confirmed. Procedure was carried out.  Time-out was performed.  Intravenous antibiotics were administered.  General endotracheal anesthesia was introduced.  Patient was placed into a low lithotomy position.  Sterile field was created by prepping the patient's vagina, introitus, and proximal thighs using iodine x3.  Next,  cystourethroscopy was performed using a 22-French rigid cystoscope with 12-degree offset lens. Inspection of bladder revealed no diverticula, calcifications, papillary lesions.  Bilateral distal ends of stents were in the normal anatomic position and without excessive encrustation, the distal end of the right stent was brought to the level of the urethral meatus through which a 0.038 Glidewire was advanced at the level of the upper pole.  This allowed exchanging stent for a 6-French end-hole catheter and right retrograde pyelogram was seen.  Right retrograde pyelogram demonstrated a single right ureter, single system right kidney.  There were several filling defects in the right kidney consistent with known stone.  The Glidewire was once again advanced as a safety wire.  Next, semi-rigid ureteroscopy was performed of the entire length of the right ureter alongside a separate Sensor working wire using normal saline irrigation under pressure and an 8- [place] feeding tube in urinary bladder for pressure release.  Inspection of entire length of the ureter revealed no mucosal abnormalities or calcifications.  Next, the semi-rigid ureteroscope was exchanged for the 12/14, 24 cm ureteral access sheath over the Sensor working wire using fluoroscopic vision.  Next, digital flexible ureteroscopy was performed of the proximal ureter and systematic inspection of the entire right kidney.  As expected, there was a single dominant large calcification of the renal pelvis greater than 1 cm and 2 smaller calcifications which were approximately 7  mm.  These appeared to be much too large for basketing.  As such, holmium laser energy was applied to the stone breaking them into fragments that were approximately 3-4 mm in size and using an escape basket to remove fragments sequentially.  This was performed for approximately an hour and a half.  Using this technique, all stones greater than approximately 1-2 mm  were successfully removed representing approximately 5% of stone volume on this side.  Given the multifocality and bilaterality of the patient's stones, it was felt that proceeding with planned staged approach was the safest.  As such, we decided to conclude the procedure today.  As such, the ureteral access sheath was removed under ureteroscopic vision.  No mucosal abnormalities were found.  Finally, a new 6 x 24 double-J stent was placed with remaining safety wire.  Good proximal distal curl were noted.  Procedure was then terminated.  The patient tolerated the procedure well.  There were no immediate periprocedural complications.  The patient was taken to postanesthesia care unit in stable condition.  We will proceed as planned with left ureteroscopic stone manipulation and second-stage right on June 20, 2013.          ______________________________ Sebastian Ache, MD     TM/MEDQ  D:  06/13/2013  T:  06/13/2013  Job:  [id number]

## 2013-06-19 ENCOUNTER — Other Ambulatory Visit: Payer: Self-pay | Admitting: Urology

## 2013-06-20 ENCOUNTER — Encounter (HOSPITAL_BASED_OUTPATIENT_CLINIC_OR_DEPARTMENT_OTHER)
Admission: RE | Disposition: A | Discharge status: Home / Self Care | Payer: Self-pay | Source: Ambulatory Visit | Attending: Urology

## 2013-06-20 ENCOUNTER — Encounter (HOSPITAL_BASED_OUTPATIENT_CLINIC_OR_DEPARTMENT_OTHER): Payer: Self-pay

## 2013-06-20 ENCOUNTER — Encounter (HOSPITAL_BASED_OUTPATIENT_CLINIC_OR_DEPARTMENT_OTHER): Payer: Self-pay | Admitting: Anesthesiology

## 2013-06-20 ENCOUNTER — Ambulatory Visit (HOSPITAL_BASED_OUTPATIENT_CLINIC_OR_DEPARTMENT_OTHER)
Admission: RE | Admit: 2013-06-20 | Discharge: 2013-06-20 | Disposition: A | Discharge status: Home / Self Care | Payer: Medicare Other | Source: Ambulatory Visit | Attending: Urology | Admitting: Urology

## 2013-06-20 ENCOUNTER — Ambulatory Visit (HOSPITAL_COMMUNITY): Payer: Medicare Other

## 2013-06-20 ENCOUNTER — Ambulatory Visit (HOSPITAL_BASED_OUTPATIENT_CLINIC_OR_DEPARTMENT_OTHER): Payer: Medicare Other | Admitting: Anesthesiology

## 2013-06-20 DIAGNOSIS — I129 Hypertensive chronic kidney disease with stage 1 through stage 4 chronic kidney disease, or unspecified chronic kidney disease: Secondary | ICD-10-CM | POA: Insufficient documentation

## 2013-06-20 DIAGNOSIS — IMO0001 Reserved for inherently not codable concepts without codable children: Secondary | ICD-10-CM | POA: Insufficient documentation

## 2013-06-20 DIAGNOSIS — N183 Chronic kidney disease, stage 3 unspecified: Secondary | ICD-10-CM | POA: Insufficient documentation

## 2013-06-20 DIAGNOSIS — N200 Calculus of kidney: Secondary | ICD-10-CM | POA: Insufficient documentation

## 2013-06-20 DIAGNOSIS — E119 Type 2 diabetes mellitus without complications: Secondary | ICD-10-CM | POA: Insufficient documentation

## 2013-06-20 DIAGNOSIS — N201 Calculus of ureter: Secondary | ICD-10-CM | POA: Insufficient documentation

## 2013-06-20 DIAGNOSIS — N1330 Unspecified hydronephrosis: Secondary | ICD-10-CM | POA: Insufficient documentation

## 2013-06-20 DIAGNOSIS — N390 Urinary tract infection, site not specified: Secondary | ICD-10-CM | POA: Insufficient documentation

## 2013-06-20 DIAGNOSIS — E785 Hyperlipidemia, unspecified: Secondary | ICD-10-CM | POA: Insufficient documentation

## 2013-06-20 HISTORY — PX: HOLMIUM LASER APPLICATION: SHX5852

## 2013-06-20 HISTORY — PX: CYSTOSCOPY WITH RETROGRADE PYELOGRAM, URETEROSCOPY AND STENT PLACEMENT: SHX5789

## 2013-06-20 HISTORY — DX: Calculus of kidney: N20.0

## 2013-06-20 LAB — POCT I-STAT, CHEM 8
BUN: 12 mg/dL (ref 6–23)
Chloride: 105 mEq/L (ref 96–112)
Potassium: 4 mEq/L (ref 3.5–5.1)
Sodium: 135 mEq/L (ref 135–145)
TCO2: 24 mmol/L (ref 0–100)

## 2013-06-20 LAB — GLUCOSE, CAPILLARY

## 2013-06-20 SURGERY — CYSTOURETEROSCOPY, WITH RETROGRADE PYELOGRAM AND STENT INSERTION
Anesthesia: General | Site: Ureter | Laterality: Right | Wound class: Clean Contaminated

## 2013-06-20 MED ORDER — SODIUM CHLORIDE 0.9 % IR SOLN
Status: DC | PRN
  Administered 2013-06-20: 9000 mL via INTRAVESICAL

## 2013-06-20 MED ORDER — GENTAMICIN IN SALINE 1.6-0.9 MG/ML-% IV SOLN
80.0000 mg | INTRAVENOUS | Status: DC
  Filled 2013-06-20: qty 50

## 2013-06-20 MED ORDER — LACTATED RINGERS IV SOLN
INTRAVENOUS | Status: DC
  Administered 2013-06-20 (×2): via INTRAVENOUS
  Filled 2013-06-20: qty 1000

## 2013-06-20 MED ORDER — SULFAMETHOXAZOLE-TMP DS 800-160 MG PO TABS: 1.0000 | ORAL_TABLET | Freq: Every day | ORAL | Status: DC

## 2013-06-20 MED ORDER — FLUCONAZOLE IN SODIUM CHLORIDE 400-0.9 MG/200ML-% IV SOLN
INTRAVENOUS | Status: DC | PRN
  Administered 2013-06-20: 100 mg via INTRAVENOUS

## 2013-06-20 MED ORDER — ACETAMINOPHEN 10 MG/ML IV SOLN
INTRAVENOUS | Status: DC | PRN
  Administered 2013-06-20: 1000 mg via INTRAVENOUS

## 2013-06-20 MED ORDER — STERILE WATER FOR IRRIGATION IR SOLN
Status: DC | PRN
  Administered 2013-06-20: 1000 mL

## 2013-06-20 MED ORDER — ONDANSETRON HCL 4 MG/2ML IJ SOLN
INTRAMUSCULAR | Status: DC | PRN
  Administered 2013-06-20: 4 mg via INTRAVENOUS

## 2013-06-20 MED ORDER — OXYCODONE-ACETAMINOPHEN 7.5-325 MG PO TABS: 1.0000 | ORAL_TABLET | Freq: Four times a day (QID) | ORAL | Status: DC | PRN

## 2013-06-20 MED ORDER — LIDOCAINE HCL (CARDIAC) 20 MG/ML IV SOLN
INTRAVENOUS | Status: DC | PRN
  Administered 2013-06-20: 100 mg via INTRAVENOUS

## 2013-06-20 MED ORDER — FENTANYL CITRATE 0.05 MG/ML IJ SOLN
INTRAMUSCULAR | Status: DC | PRN
  Administered 2013-06-20 (×4): 50 ug via INTRAVENOUS
  Administered 2013-06-20: 100 ug via INTRAVENOUS

## 2013-06-20 MED ORDER — KETOROLAC TROMETHAMINE 30 MG/ML IJ SOLN
INTRAMUSCULAR | Status: DC | PRN
  Administered 2013-06-20: 30 mg via INTRAVENOUS

## 2013-06-20 MED ORDER — HYDROMORPHONE HCL PF 1 MG/ML IJ SOLN
0.2500 mg | INTRAMUSCULAR | Status: DC | PRN
  Filled 2013-06-20: qty 1

## 2013-06-20 MED ORDER — IOHEXOL 350 MG/ML SOLN
INTRAVENOUS | Status: DC | PRN
  Administered 2013-06-20: 34 mL

## 2013-06-20 MED ORDER — GENTAMICIN SULFATE 40 MG/ML IJ SOLN
5.0000 mg/kg | INTRAMUSCULAR | Status: AC
  Administered 2013-06-20: 415 mg via INTRAVENOUS
  Filled 2013-06-20: qty 10.38

## 2013-06-20 MED ORDER — DEXAMETHASONE SODIUM PHOSPHATE 4 MG/ML IJ SOLN
INTRAMUSCULAR | Status: DC | PRN
  Administered 2013-06-20: 10 mg via INTRAVENOUS

## 2013-06-20 MED ORDER — PROPOFOL 10 MG/ML IV BOLUS
INTRAVENOUS | Status: DC | PRN
  Administered 2013-06-20: 300 mg via INTRAVENOUS

## 2013-06-20 MED ORDER — MIDAZOLAM HCL 5 MG/5ML IJ SOLN
INTRAMUSCULAR | Status: DC | PRN
  Administered 2013-06-20: 2 mg via INTRAVENOUS

## 2013-06-20 MED ORDER — LACTATED RINGERS IV SOLN
INTRAVENOUS | Status: DC
  Filled 2013-06-20: qty 1000

## 2013-06-20 SURGICAL SUPPLY — 47 items
ADAPTER CATH URET PLST 4-6FR (CATHETERS) IMPLANT
ADPR CATH URET STRL DISP 4-6FR (CATHETERS)
BAG DRAIN URO-CYSTO SKYTR STRL (DRAIN) ×3 IMPLANT
BAG DRN UROCATH (DRAIN) ×2
BAG URO CATCHER STRL LF (DRAPE) ×3 IMPLANT
BASKET LASER NITINOL 1.9FR (BASKET) ×3 IMPLANT
BASKET STNLS GEMINI 4WIRE 3FR (BASKET) IMPLANT
BASKET ZERO TIP NITINOL 2.4FR (BASKET) IMPLANT
BRUSH URET BIOPSY 3F (UROLOGICAL SUPPLIES) IMPLANT
BSKT STON RTRVL 120 1.9FR (BASKET) ×2
BSKT STON RTRVL GEM 120X11 3FR (BASKET)
BSKT STON RTRVL ZERO TP 2.4FR (BASKET)
CANISTER SUCT LVC 12 LTR MEDI- (MISCELLANEOUS) ×1 IMPLANT
CANISTER SUCTION 2500CC (MISCELLANEOUS) ×1 IMPLANT
CATH FOLEY 2WAY  3CC  8FR (CATHETERS)
CATH FOLEY 2WAY 3CC 8FR (CATHETERS) IMPLANT
CATH INTERMIT  6FR 70CM (CATHETERS) ×1 IMPLANT
CATH URET 5FR 28IN CONE TIP (BALLOONS)
CATH URET 5FR 28IN OPEN ENDED (CATHETERS) IMPLANT
CATH URET 5FR 70CM CONE TIP (BALLOONS) IMPLANT
CLOTH BEACON ORANGE TIMEOUT ST (SAFETY) ×3 IMPLANT
DRAPE CAMERA CLOSED 9X96 (DRAPES) ×3 IMPLANT
ELECT REM PT RETURN 9FT ADLT (ELECTROSURGICAL)
ELECTRODE REM PT RTRN 9FT ADLT (ELECTROSURGICAL) IMPLANT
FIBER LASER TRAC TIP (UROLOGICAL SUPPLIES) ×1 IMPLANT
GLOVE BIO SURGEON STRL SZ7 (GLOVE) ×1 IMPLANT
GLOVE BIO SURGEON STRL SZ7.5 (GLOVE) ×3 IMPLANT
GLOVE INDICATOR 7.5 STRL GRN (GLOVE) ×2 IMPLANT
GOWN PREVENTION PLUS LG XLONG (DISPOSABLE) ×6 IMPLANT
GOWN STRL REIN XL XLG (GOWN DISPOSABLE) ×3 IMPLANT
GUIDEWIRE 0.038 PTFE COATED (WIRE) IMPLANT
GUIDEWIRE ANG ZIPWIRE 038X150 (WIRE) ×3 IMPLANT
GUIDEWIRE STR DUAL SENSOR (WIRE) ×3 IMPLANT
IV NS IRRIG 3000ML ARTHROMATIC (IV SOLUTION) ×7 IMPLANT
KIT BALLIN UROMAX 15FX10 (LABEL) IMPLANT
KIT BALLN UROMAX 15FX4 (MISCELLANEOUS) IMPLANT
KIT BALLN UROMAX 26 75X4 (MISCELLANEOUS)
PACK CYSTOSCOPY (CUSTOM PROCEDURE TRAY) ×3 IMPLANT
SET HIGH PRES BAL DIL (LABEL)
SHEATH ACCESS URETERAL 24CM (SHEATH) ×1 IMPLANT
SHEATH URET ACCESS 12FR/35CM (UROLOGICAL SUPPLIES) IMPLANT
SHEATH URET ACCESS 12FR/55CM (UROLOGICAL SUPPLIES) IMPLANT
STENT URET 6FRX24 CONTOUR (STENTS) ×2 IMPLANT
SYRINGE 10CC LL (SYRINGE) ×3 IMPLANT
SYRINGE IRR TOOMEY STRL 70CC (SYRINGE) IMPLANT
TUBE FEEDING 8FR 16IN STR KANG (MISCELLANEOUS) ×1 IMPLANT
UNDERPAD 30X30 INCONTINENT (UNDERPADS AND DIAPERS) ×1 IMPLANT

## 2013-06-20 NOTE — Anesthesia Preprocedure Evaluation (Addendum)
Anesthesia Evaluation  Patient identified by MRN, date of birth, ID band Patient awake    Reviewed: Allergy & Precautions, H&P , NPO status , Patient's Chart, lab work & pertinent test results  Airway Mallampati: III TM Distance: <3 FB Neck ROM: Full    Dental no notable dental hx. (+) Teeth Intact and Caps,    Pulmonary asthma ,  breath sounds clear to auscultation  + decreased breath sounds      Cardiovascular hypertension, Pt. on medications Rhythm:Regular Rate:Normal     Neuro/Psych Anxiety Depression  Neuromuscular disease negative neurological ROS  negative psych ROS   GI/Hepatic negative GI ROS, Neg liver ROS,   Endo/Other  diabetes, Well Controlled, Type 2, Oral Hypoglycemic AgentsMorbid obesity  Renal/GU Renal InsufficiencyRenal disease  negative genitourinary   Musculoskeletal negative musculoskeletal ROS (+) Fibromyalgia -, narcotic dependent  Abdominal   Peds negative pediatric ROS (+)  Hematology negative hematology ROS (+)   Anesthesia Other Findings Patient denies risk of pregnancy. States she has taken her po Percocet this AM..  Reproductive/Obstetrics negative OB ROS                          Anesthesia Physical Anesthesia Plan  ASA: III  Anesthesia Plan: General   Post-op Pain Management:    Induction: Intravenous  Airway Management Planned: LMA  Additional Equipment:   Intra-op Plan:   Post-operative Plan:   Informed Consent: I have reviewed the patients History and Physical, chart, labs and discussed the procedure including the risks, benefits and alternatives for the proposed anesthesia with the patient or authorized representative who has indicated his/her understanding and acceptance.   Dental advisory given  Plan Discussed with: CRNA  Anesthesia Plan Comments:         Anesthesia Quick Evaluation

## 2013-06-20 NOTE — Anesthesia Procedure Notes (Signed)
Procedure Name: LMA Insertion Date/Time: 06/20/2013 9:55 AM Performed by: Maris Berger T Pre-anesthesia Checklist: Patient identified, Emergency Drugs available, Suction available and Patient being monitored Patient Re-evaluated:Patient Re-evaluated prior to inductionOxygen Delivery Method: Circle System Utilized Preoxygenation: Pre-oxygenation with 100% oxygen Intubation Type: IV induction Ventilation: Mask ventilation without difficulty LMA: LMA inserted LMA Size: 4.0 Number of attempts: 1 Airway Equipment and Method: bite block Placement Confirmation: positive ETCO2 Dental Injury: Teeth and Oropharynx as per pre-operative assessment

## 2013-06-20 NOTE — Transfer of Care (Signed)
Immediate Anesthesia Transfer of Care Note  Patient: Grace Singh  Procedure(s) Performed: Procedure(s) with comments: CYSTOSCOPY WITH RETROGRADE PYELOGRAM, URETEROSCOPY AND STENT PLACEMENT, REMOVAL BILATERAL STENTS (Bilateral) - Bilateral stent repalcement with not string HOLMIUM LASER APPLICATION (Right)  Patient Location: PACU  Anesthesia Type:General  Level of Consciousness: awake and oriented  Airway & Oxygen Therapy: Patient Spontanous Breathing and Patient connected to nasal cannula oxygen  Post-op Assessment: Report given to PACU RN  Post vital signs: Reviewed and stable  Complications: No apparent anesthesia complications

## 2013-06-20 NOTE — Brief Op Note (Signed)
06/20/2013  12:08 PM  PATIENT:  Grace Singh  47 y.o. female  PRE-OPERATIVE DIAGNOSIS:  BILATERAL LARGE RENAL STONES  POST-OPERATIVE DIAGNOSIS:  BILATERAL LARGE RENAL STONES  PROCEDURE:  Procedure(s) with comments: CYSTOSCOPY WITH RETROGRADE PYELOGRAM, URETEROSCOPY AND STENT PLACEMENT, REMOVAL BILATERAL STENTS (Bilateral) - Bilateral stent repalcement with not string HOLMIUM LASER APPLICATION (Right)  SURGEON:  Surgeon(s) and Role:    * Sebastian Ache, MD - Primary  PHYSICIAN ASSISTANT:   ASSISTANTS: none   ANESTHESIA:   general  EBL:  Total I/O In: 1000 [I.V.:1000] Out: -   BLOOD ADMINISTERED:none  DRAINS: none   LOCAL MEDICATIONS USED:  NONE  SPECIMEN:  Source of Specimen:  Left Renal Stone  DISPOSITION OF SPECIMEN:  [facility] for compositional analysis  COUNTS:  YES  TOURNIQUET:  * No tourniquets in log *  DICTATION: .Other Dictation: Dictation Number [id number]  PLAN OF CARE: Discharge to home after PACU  PATIENT DISPOSITION:  PACU - hemodynamically stable.   Delay start of Pharmacological VTE agent (>24hrs) due to surgical blood loss or risk of bleeding: not applicable

## 2013-06-20 NOTE — H&P (Signed)
Grace Singh is an 47 y.o. female.    Chief Complaint: Pre-op Bilateral 2nd Stage Ureteroscopic Stone Manipulation  HPI:   1- Bilateral Ureteral Stones, h/o urosepsis - S/p emergent cysto and bilateral stents 05/11/13 for bilateral UPJ stones, Lt 12mm and Rt 12mm + 7mm lower pole. No piror stone treatment. Has known of left stone for sometime. Subsequently underwent Rt first stage ureteroscopic stone manipulation 7/9 without complications at which time the majority of her Rt sided stone.  2 - Left Renal Atrophy - Incidetnal left renal atrophy by CT 05/2013. No funcitonal imaging prior, likely 2/2 chronic obstruction from stones as per above. Renogram 05/2013 with 33% Left / 58% Rt relative funciton.   3 - UTI / Urosepsis - Pt wtih pan-sensitive Klebsiella at time of urosepsis presentaiton 05/2013. Has been on Cipro (CX-specific) to finish course. Most recent UCX at [facility] negative.   PMH sig for morbid obesiy, DM2, back sugrery / chronic cocyx pain, appy, GB, Asthma   Today Grace Singh is seen to proceed with second stage ureteroscopic stone manipulation to address her multifocal large volume stones. No interval fevers.   Past Medical History  Diagnosis Date  . Hypertension   . Anxiety   . Depression   . Fibromyalgia   . Hyperlipidemia   . Seasonal asthma   . Diabetes mellitus, type 2   . CKD (chronic kidney disease), stage III   . Wears glasses   . Arthritis     TAILBONE AREA  . Iron deficiency anemia   . Bilateral hydronephrosis   . History of sepsis     05-11-2013  W/ UROSEPSIS---  RESOLIVED  . Normal cardiac stress test     PER PT 2007  . Atrophic kidney     LEFT  . Renal calculi     bilateral    Past Surgical History  Procedure Laterality Date  . Coccyx removal  2009  . Hysteroscopy w/d&c N/A 04/20/2013    Procedure: DILATATION AND CURETTAGE /HYSTEROSCOPY;  Surgeon: Zelphia Cairo, MD;  Location: [facility];  Service: Gynecology;  Laterality: N/A;  . Polypectomy N/A  04/20/2013    Procedure: POLYPECTOMY;  Surgeon: Zelphia Cairo, MD;  Location: [facility];  Service: Gynecology;  Laterality: N/A;  . Cystoscopy with stent placement Bilateral 05/11/2013    Procedure: CYSTOSCOPY WITH STENT PLACEMENT;  Surgeon: Sebastian Ache, MD;  Location: [facility];  Service: Urology;  Laterality: Bilateral;  . Laparoscopic cholecystectomy  AGE 89  . Appendectomy  AGE 60  . Cystoscopy with retrograde pyelogram, ureteroscopy and stent placement Right 06/13/2013    Procedure: First Stage Bilateral Ureteroscopy;  Surgeon: Sebastian Ache, MD;  Location: [facility];  Service: Urology;  Laterality: Right;  . Holmium laser application Right 06/13/2013    Procedure: HOLMIUM LASER APPLICATION, right ureteral stone;  Surgeon: Sebastian Ache, MD;  Location: [facility];  Service: Urology;  Laterality: Right;  . Cystoscopy w/ ureteral stent placement Right 06/13/2013    Procedure: CYSTOSCOPY WITH STENT REPLACEMENT, right;  Surgeon: Sebastian Ache, MD;  Location: [facility];  Service: Urology;  Laterality: Right;    History reviewed. No pertinent family history. Social History:  reports that she has never smoked. She has never used smokeless tobacco. She reports that she does not drink alcohol or use illicit drugs.  Allergies: No Known Allergies  No prescriptions prior to admission    No results found for this or any previous visit (from the past 48 hour(s)). No  results found.  Review of Systems  Constitutional: Negative for fever and chills.  HENT: Negative.   Eyes: Negative.   Respiratory: Negative.   Cardiovascular: Negative.   Gastrointestinal: Negative.   Genitourinary: Positive for frequency.  Musculoskeletal: Negative.   Skin: Negative.   Neurological: Negative.   Endo/Heme/Allergies: Negative.   Psychiatric/Behavioral: Negative.     Last menstrual period 04/19/2013. Physical Exam  Constitutional: She is oriented to person, place,  and time. She appears well-developed and well-nourished.  HENT:  Head: Normocephalic and atraumatic.  Eyes: Pupils are equal, round, and reactive to light.  Neck: Normal range of motion. Neck supple.  Cardiovascular: Normal rate and regular rhythm.   Respiratory: Effort normal.  GI: Soft.  Obesity limits sensitivity of exam  Genitourinary:  No CVAT  Musculoskeletal: Normal range of motion.  Neurological: She is alert and oriented to person, place, and time.  Skin: Skin is warm and dry.  Psychiatric: She has a normal mood and affect. Her behavior is normal. Judgment and thought content normal.     Assessment/Plan  1- Bilateral Ureteral Stones, h/o urosepsis -   We re-discussed ureteroscopic stone manipulation with basketing and laser-lithotripsy in detail.  We rediscussed risks including bleeding, infection, damage to kidney / ureter  bladder, rarely loss of kidney. We rediscussed anesthetic risks and rare but serious surgical complications including DVT, PE, MI, and mortality. We specifically addressed that in her case we would plan on staged approach now with second and hopefully final  stage today. The patient voiced understanding and wises to proceed.   2 - Left Renal Atrophy - Renogram with compromised but meaningful function, will proceed with stone removal to help minimize future decline.  3 - UTI / Urosepsis - Resolved by most recent CX's and clinically. Removing stone today should help reduce future infectious risk.   Grace Singh 06/20/2013, 4:51 AM

## 2013-06-21 ENCOUNTER — Emergency Department (HOSPITAL_COMMUNITY): Payer: Medicare Other

## 2013-06-21 ENCOUNTER — Encounter (HOSPITAL_COMMUNITY): Payer: Self-pay | Admitting: Emergency Medicine

## 2013-06-21 ENCOUNTER — Inpatient Hospital Stay (HOSPITAL_COMMUNITY)
Admission: EM | Admit: 2013-06-21 | Discharge: 2013-06-23 | DRG: 853 | Disposition: A | Discharge status: Home / Self Care | Payer: Medicare Other | Attending: Pulmonary Disease | Admitting: Pulmonary Disease

## 2013-06-21 DIAGNOSIS — N183 Chronic kidney disease, stage 3 unspecified: Secondary | ICD-10-CM | POA: Diagnosis present

## 2013-06-21 DIAGNOSIS — F329 Major depressive disorder, single episode, unspecified: Secondary | ICD-10-CM | POA: Diagnosis present

## 2013-06-21 DIAGNOSIS — D509 Iron deficiency anemia, unspecified: Secondary | ICD-10-CM | POA: Diagnosis present

## 2013-06-21 DIAGNOSIS — E119 Type 2 diabetes mellitus without complications: Secondary | ICD-10-CM | POA: Diagnosis present

## 2013-06-21 DIAGNOSIS — IMO0001 Reserved for inherently not codable concepts without codable children: Secondary | ICD-10-CM | POA: Diagnosis present

## 2013-06-21 DIAGNOSIS — F3289 Other specified depressive episodes: Secondary | ICD-10-CM | POA: Diagnosis present

## 2013-06-21 DIAGNOSIS — E785 Hyperlipidemia, unspecified: Secondary | ICD-10-CM | POA: Diagnosis present

## 2013-06-21 DIAGNOSIS — I129 Hypertensive chronic kidney disease with stage 1 through stage 4 chronic kidney disease, or unspecified chronic kidney disease: Secondary | ICD-10-CM | POA: Diagnosis present

## 2013-06-21 DIAGNOSIS — N10 Acute tubulo-interstitial nephritis: Secondary | ICD-10-CM

## 2013-06-21 DIAGNOSIS — R6521 Severe sepsis with septic shock: Secondary | ICD-10-CM | POA: Diagnosis present

## 2013-06-21 DIAGNOSIS — E669 Obesity, unspecified: Secondary | ICD-10-CM | POA: Diagnosis present

## 2013-06-21 DIAGNOSIS — R7881 Bacteremia: Secondary | ICD-10-CM

## 2013-06-21 DIAGNOSIS — A419 Sepsis, unspecified organism: Principal | ICD-10-CM

## 2013-06-21 DIAGNOSIS — N200 Calculus of kidney: Secondary | ICD-10-CM

## 2013-06-21 DIAGNOSIS — N201 Calculus of ureter: Secondary | ICD-10-CM | POA: Diagnosis present

## 2013-06-21 DIAGNOSIS — F411 Generalized anxiety disorder: Secondary | ICD-10-CM | POA: Diagnosis present

## 2013-06-21 DIAGNOSIS — Z6841 Body Mass Index (BMI) 40.0 and over, adult: Secondary | ICD-10-CM

## 2013-06-21 DIAGNOSIS — E1129 Type 2 diabetes mellitus with other diabetic kidney complication: Secondary | ICD-10-CM | POA: Diagnosis present

## 2013-06-21 DIAGNOSIS — Z79899 Other long term (current) drug therapy: Secondary | ICD-10-CM

## 2013-06-21 DIAGNOSIS — IMO0002 Reserved for concepts with insufficient information to code with codable children: Secondary | ICD-10-CM | POA: Diagnosis present

## 2013-06-21 LAB — URINALYSIS, ROUTINE W REFLEX MICROSCOPIC
Glucose, UA: NEGATIVE mg/dL
Ketones, ur: NEGATIVE mg/dL
Nitrite: NEGATIVE
pH: 5.5 (ref 5.0–8.0)

## 2013-06-21 LAB — CBC WITH DIFFERENTIAL/PLATELET
Basophils Absolute: 0 10*3/uL (ref 0.0–0.1)
Basophils Relative: 0 % (ref 0–1)
HCT: 38.5 % (ref 36.0–46.0)
Lymphocytes Relative: 6 % — ABNORMAL LOW (ref 12–46)
MCHC: 32.7 g/dL (ref 30.0–36.0)
Monocytes Absolute: 0.7 10*3/uL (ref 0.1–1.0)
Neutro Abs: 18.6 10*3/uL — ABNORMAL HIGH (ref 1.7–7.7)
Neutrophils Relative %: 90 % — ABNORMAL HIGH (ref 43–77)
RDW: 15.7 % — ABNORMAL HIGH (ref 11.5–15.5)
WBC: 20.6 10*3/uL — ABNORMAL HIGH (ref 4.0–10.5)

## 2013-06-21 LAB — BASIC METABOLIC PANEL
CO2: 21 mEq/L (ref 19–32)
Chloride: 94 mEq/L — ABNORMAL LOW (ref 96–112)
GFR calc Af Amer: >90 mL/min (ref >90)
Potassium: 3.5 mEq/L (ref 3.5–5.1)
Sodium: 134 mEq/L — ABNORMAL LOW (ref 135–145)

## 2013-06-21 LAB — URINE MICROSCOPIC-ADD ON

## 2013-06-21 LAB — GLUCOSE, CAPILLARY
Glucose-Capillary: 181 mg/dL — ABNORMAL HIGH (ref 70–99)
Glucose-Capillary: 159 mg/dL — ABNORMAL HIGH (ref 70–99)

## 2013-06-21 LAB — MRSA PCR SCREENING: MRSA by PCR: NEGATIVE

## 2013-06-21 LAB — LACTIC ACID, PLASMA: Lactic Acid, Venous: 4.3 mmol/L — ABNORMAL HIGH (ref 0.5–2.2)

## 2013-06-21 MED ORDER — CITALOPRAM HYDROBROMIDE 40 MG PO TABS
40.0000 mg | ORAL_TABLET | Freq: Every day | ORAL | Status: DC
  Administered 2013-06-21 – 2013-06-23 (×3): 40 mg via ORAL
  Filled 2013-06-21 (×3): qty 1

## 2013-06-21 MED ORDER — SODIUM CHLORIDE 0.9 % IV BOLUS (SEPSIS)
1000.0000 mL | Freq: Once | INTRAVENOUS | Status: AC
  Administered 2013-06-21: 1000 mL via INTRAVENOUS

## 2013-06-21 MED ORDER — ACETAMINOPHEN 325 MG PO TABS
650.0000 mg | ORAL_TABLET | Freq: Once | ORAL | Status: DC
  Filled 2013-06-21: qty 2

## 2013-06-21 MED ORDER — BUPROPION HCL 75 MG PO TABS
75.0000 mg | ORAL_TABLET | Freq: Two times a day (BID) | ORAL | Status: DC
  Administered 2013-06-21 – 2013-06-23 (×5): 75 mg via ORAL
  Filled 2013-06-21 (×6): qty 1

## 2013-06-21 MED ORDER — SODIUM CHLORIDE 0.9 % IV SOLN
1.0000 g | Freq: Three times a day (TID) | INTRAVENOUS | Status: DC
  Administered 2013-06-21 – 2013-06-23 (×6): 1 g via INTRAVENOUS
  Filled 2013-06-21 (×7): qty 1

## 2013-06-21 MED ORDER — SODIUM CHLORIDE 0.9 % IV SOLN
1000.0000 mL | INTRAVENOUS | Status: DC
  Administered 2013-06-21 (×2): 1000 mL via INTRAVENOUS

## 2013-06-21 MED ORDER — SODIUM CHLORIDE 0.9 % IV SOLN: 250.0000 mL | INTRAVENOUS | Status: DC | PRN

## 2013-06-21 MED ORDER — FERROUS SULFATE 325 (65 FE) MG PO TABS
325.0000 mg | ORAL_TABLET | Freq: Two times a day (BID) | ORAL | Status: DC
  Administered 2013-06-22 – 2013-06-23 (×3): 325 mg via ORAL
  Filled 2013-06-21 (×6): qty 1

## 2013-06-21 MED ORDER — VANCOMYCIN HCL IN DEXTROSE 1-5 GM/200ML-% IV SOLN
1000.0000 mg | Freq: Once | INTRAVENOUS | Status: DC
  Filled 2013-06-21: qty 200

## 2013-06-21 MED ORDER — INSULIN ASPART 100 UNIT/ML ~~LOC~~ SOLN
3.0000 [IU] | Freq: Three times a day (TID) | SUBCUTANEOUS | Status: DC
  Administered 2013-06-22 – 2013-06-23 (×4): 3 [IU] via SUBCUTANEOUS

## 2013-06-21 MED ORDER — VANCOMYCIN HCL IN DEXTROSE 1-5 GM/200ML-% IV SOLN: 1000.0000 mg | Freq: Two times a day (BID) | INTRAVENOUS | Status: DC

## 2013-06-21 MED ORDER — LOPERAMIDE HCL 2 MG PO CAPS
2.0000 mg | ORAL_CAPSULE | ORAL | Status: DC | PRN
  Administered 2013-06-21 – 2013-06-22 (×3): 2 mg via ORAL
  Filled 2013-06-21 (×3): qty 1

## 2013-06-21 MED ORDER — ONDANSETRON HCL 4 MG/2ML IJ SOLN
4.0000 mg | Freq: Once | INTRAMUSCULAR | Status: AC
  Administered 2013-06-21: 4 mg via INTRAVENOUS
  Filled 2013-06-21: qty 2

## 2013-06-21 MED ORDER — ADULT MULTIVITAMIN W/MINERALS CH
1.0000 | ORAL_TABLET | Freq: Every day | ORAL | Status: DC
  Administered 2013-06-21 – 2013-06-23 (×3): 1 via ORAL
  Filled 2013-06-21 (×3): qty 1

## 2013-06-21 MED ORDER — ASPIRIN 81 MG PO CHEW
324.0000 mg | CHEWABLE_TABLET | ORAL | Status: AC
  Administered 2013-06-21: 324 mg via ORAL
  Filled 2013-06-21: qty 4

## 2013-06-21 MED ORDER — ACETAMINOPHEN 325 MG PO TABS
650.0000 mg | ORAL_TABLET | ORAL | Status: DC | PRN
  Administered 2013-06-21 – 2013-06-22 (×3): 650 mg via ORAL
  Filled 2013-06-21 (×2): qty 2

## 2013-06-21 MED ORDER — SODIUM CHLORIDE 0.9 % IV SOLN
1000.0000 mL | Freq: Once | INTRAVENOUS | Status: AC
  Administered 2013-06-21: 1000 mL via INTRAVENOUS

## 2013-06-21 MED ORDER — ACETAMINOPHEN 325 MG PO TABS
650.0000 mg | ORAL_TABLET | Freq: Once | ORAL | Status: AC
  Administered 2013-06-21: 650 mg via ORAL
  Filled 2013-06-21: qty 2

## 2013-06-21 MED ORDER — CIPROFLOXACIN IN D5W 400 MG/200ML IV SOLN
400.0000 mg | Freq: Once | INTRAVENOUS | Status: AC
  Administered 2013-06-21: 400 mg via INTRAVENOUS
  Filled 2013-06-21: qty 200

## 2013-06-21 MED ORDER — FLUCONAZOLE 150 MG PO TABS
150.0000 mg | ORAL_TABLET | Freq: Every day | ORAL | Status: DC
  Administered 2013-06-22 – 2013-06-23 (×2): 150 mg via ORAL
  Filled 2013-06-21 (×3): qty 1

## 2013-06-21 MED ORDER — LAMOTRIGINE 100 MG PO TABS: 100.0000 mg | ORAL_TABLET | Freq: Two times a day (BID) | ORAL | Status: DC

## 2013-06-21 MED ORDER — INSULIN ASPART 100 UNIT/ML ~~LOC~~ SOLN: 0.0000 [IU] | Freq: Every day | SUBCUTANEOUS | Status: DC

## 2013-06-21 MED ORDER — INSULIN ASPART 100 UNIT/ML ~~LOC~~ SOLN
0.0000 [IU] | SUBCUTANEOUS | Status: DC
  Administered 2013-06-21: 3 [IU] via SUBCUTANEOUS
  Administered 2013-06-21: 5 [IU] via SUBCUTANEOUS

## 2013-06-21 MED ORDER — SODIUM CHLORIDE 0.9 % IV SOLN
INTRAVENOUS | Status: DC
  Administered 2013-06-21: 100 mL/h via INTRAVENOUS
  Administered 2013-06-21: 100 mL via INTRAVENOUS
  Administered 2013-06-22 (×2): 100 mL/h via INTRAVENOUS
  Administered 2013-06-23: 06:00:00 via INTRAVENOUS

## 2013-06-21 MED ORDER — INSULIN ASPART 100 UNIT/ML ~~LOC~~ SOLN
0.0000 [IU] | Freq: Three times a day (TID) | SUBCUTANEOUS | Status: DC
  Administered 2013-06-22 – 2013-06-23 (×4): 3 [IU] via SUBCUTANEOUS

## 2013-06-21 MED ORDER — ASPIRIN 300 MG RE SUPP
300.0000 mg | RECTAL | Status: AC
  Filled 2013-06-21: qty 1

## 2013-06-21 MED ORDER — VANCOMYCIN HCL IN DEXTROSE 1-5 GM/200ML-% IV SOLN
1000.0000 mg | Freq: Once | INTRAVENOUS | Status: AC
  Administered 2013-06-21: 1000 mg via INTRAVENOUS
  Filled 2013-06-21: qty 200

## 2013-06-21 MED ORDER — SODIUM CHLORIDE 0.9 % IV SOLN: 1.0000 g | Freq: Three times a day (TID) | INTRAVENOUS | Status: DC

## 2013-06-21 MED ORDER — SODIUM CHLORIDE 0.9 % IV SOLN
1.0000 g | INTRAVENOUS | Status: AC
  Administered 2013-06-21: 1 g via INTRAVENOUS
  Filled 2013-06-21: qty 1

## 2013-06-21 MED ORDER — LAMOTRIGINE 200 MG PO TABS
200.0000 mg | ORAL_TABLET | Freq: Every day | ORAL | Status: DC
  Administered 2013-06-21 – 2013-06-23 (×3): 200 mg via ORAL
  Filled 2013-06-21 (×3): qty 1

## 2013-06-21 MED ORDER — METOCLOPRAMIDE HCL 5 MG/ML IJ SOLN
10.0000 mg | Freq: Once | INTRAMUSCULAR | Status: AC
  Administered 2013-06-21: 10 mg via INTRAVENOUS
  Filled 2013-06-21: qty 2

## 2013-06-21 MED ORDER — ENOXAPARIN SODIUM 40 MG/0.4ML ~~LOC~~ SOLN
40.0000 mg | Freq: Every day | SUBCUTANEOUS | Status: DC
  Administered 2013-06-21 – 2013-06-22 (×2): 40 mg via SUBCUTANEOUS
  Filled 2013-06-21 (×3): qty 0.4

## 2013-06-21 MED ORDER — SENNOSIDES-DOCUSATE SODIUM 8.6-50 MG PO TABS
1.0000 | ORAL_TABLET | Freq: Two times a day (BID) | ORAL | Status: DC
  Filled 2013-06-21 (×5): qty 1

## 2013-06-21 MED ORDER — DEXTROSE 5 % IV SOLN
1.0000 g | Freq: Once | INTRAVENOUS | Status: DC
  Filled 2013-06-21: qty 10

## 2013-06-21 MED ORDER — LAMOTRIGINE 100 MG PO TABS
100.0000 mg | ORAL_TABLET | Freq: Every day | ORAL | Status: DC
  Administered 2013-06-21 – 2013-06-22 (×2): 100 mg via ORAL
  Filled 2013-06-21 (×3): qty 1

## 2013-06-21 NOTE — ED Notes (Signed)
Pt presents to ED with c/o fever and sore throat and "terrible headache" with nausea, no vomiting.  Pt reports that she has had "stents" placed for her kidney stones-- was discharged with prescription for ABT; pt states that she felt fine at bedtime but woke up with a fever.

## 2013-06-21 NOTE — Consult Note (Signed)
Reason for Consult: Urosepsis, Recent Ureteroscopic Stone Manipulation  Referring Physician: Claudette Head MD  Grace Singh is an 47 y.o. female.   HPI:   1 - Urosepsis - Pt with episode urosepsis 05/11/2013 with bilateral obstructing stones prompting ICU admission after emergent bilateral ureteral stenting. Micro from that time Klebsiella. She was treated with IV bridged to PO ABX and had most recently negative urine culture at [facility] office. Therefore underwent staged ureteroscopic approach to her stones s/p last stage yesterday as outpatient having received CX-specific perioperative ABX. Pt then developed malaise, chills, fever to 103 at home about 12 hours after discharge from hospital, called [facility] and told to report to ER where she was found to be tachycardic to 140's, hypotensive, and with leukocytosis to 20k worrisome for recurrent urosepsis. UA / micro from ER with pyuria w/o organisms, UCX, BCX now pending.  2 - Bilateral UPJ Stones - s/p bilat stents 05/11/13, then first stage ureteroscopic stone manipulation 7/9 and second (final) stage 7/16 with ureteral stent exchange, now clinically stone free.  Today Grace Singh is seen in consultation for above. She is now admitted to pulmonary critical care team from ER for presumed urosepsis / SIRS.   Past Medical History  Diagnosis Date  . Hypertension   . Anxiety   . Depression   . Fibromyalgia   . Hyperlipidemia   . Seasonal asthma   . Diabetes mellitus, type 2   . CKD (chronic kidney disease), stage III   . Wears glasses   . Arthritis     TAILBONE AREA  . Iron deficiency anemia   . Bilateral hydronephrosis   . History of sepsis     05-11-2013  W/ UROSEPSIS---  RESOLIVED  . Normal cardiac stress test     PER PT 2007  . Atrophic kidney     LEFT  . Renal calculi     bilateral    Past Surgical History  Procedure Laterality Date  . Coccyx removal  2009  . Hysteroscopy w/d&c N/A 04/20/2013    Procedure: DILATATION  AND CURETTAGE /HYSTEROSCOPY;  Surgeon: Zelphia Cairo, MD;  Location: [facility];  Service: Gynecology;  Laterality: N/A;  . Polypectomy N/A 04/20/2013    Procedure: POLYPECTOMY;  Surgeon: Zelphia Cairo, MD;  Location: [facility];  Service: Gynecology;  Laterality: N/A;  . Cystoscopy with stent placement Bilateral 05/11/2013    Procedure: CYSTOSCOPY WITH STENT PLACEMENT;  Surgeon: Sebastian Ache, MD;  Location: [facility];  Service: Urology;  Laterality: Bilateral;  . Laparoscopic cholecystectomy  AGE 89  . Appendectomy  AGE 27  . Cystoscopy with retrograde pyelogram, ureteroscopy and stent placement Right 06/13/2013    Procedure: First Stage Bilateral Ureteroscopy;  Surgeon: Sebastian Ache, MD;  Location: [facility];  Service: Urology;  Laterality: Right;  . Holmium laser application Right 06/13/2013    Procedure: HOLMIUM LASER APPLICATION, right ureteral stone;  Surgeon: Sebastian Ache, MD;  Location: [facility];  Service: Urology;  Laterality: Right;  . Cystoscopy w/ ureteral stent placement Right 06/13/2013    Procedure: CYSTOSCOPY WITH STENT REPLACEMENT, right;  Surgeon: Sebastian Ache, MD;  Location: [facility];  Service: Urology;  Laterality: Right;  . Cystoscopy with retrograde pyelogram, ureteroscopy and stent placement Bilateral 06/20/2013    Procedure: CYSTOSCOPY WITH RETROGRADE PYELOGRAM, URETEROSCOPY AND STENT PLACEMENT, REMOVAL BILATERAL STENTS;  Surgeon: Sebastian Ache, MD;  Location: [facility];  Service: Urology;  Laterality: Bilateral;  Bilateral stent repalcement with not string  .  Holmium laser application Right 06/20/2013    Procedure: HOLMIUM LASER APPLICATION;  Surgeon: Sebastian Ache, MD;  Location: [facility];  Service: Urology;  Laterality: Right;    History reviewed. No pertinent family history.  Social History:  reports that she has never smoked. She has never used smokeless tobacco. She reports that she does  not drink alcohol or use illicit drugs.  Allergies: No Known Allergies  Medications: I have reviewed the patient's current medications.  Results for orders placed during the hospital encounter of 06/21/13 (from the past 48 hour(s))  CBC WITH DIFFERENTIAL     Status: Abnormal   Collection Time    06/21/13  6:16 AM      Result Value Range   WBC 20.6 (*) 4.0 - 10.5 K/uL   RBC 4.73  3.87 - 5.11 MIL/uL   Hemoglobin 12.6  12.0 - 15.0 g/dL   HCT 91.4  78.2 - 95.6 %   MCV 81.4  78.0 - 100.0 fL   MCH 26.6  26.0 - 34.0 pg   MCHC 32.7  30.0 - 36.0 g/dL   RDW 21.3 (*) 08.6 - 57.8 %   Platelets 289  150 - 400 K/uL   Neutrophils Relative % 90 (*) 43 - 77 %   Neutro Abs 18.6 (*) 1.7 - 7.7 K/uL   Lymphocytes Relative 6 (*) 12 - 46 %   Lymphs Abs 1.3  0.7 - 4.0 K/uL   Monocytes Relative 3  3 - 12 %   Monocytes Absolute 0.7  0.1 - 1.0 K/uL   Eosinophils Relative 0  0 - 5 %   Eosinophils Absolute 0.0  0.0 - 0.7 K/uL   Basophils Relative 0  0 - 1 %   Basophils Absolute 0.0  0.0 - 0.1 K/uL  BASIC METABOLIC PANEL     Status: Abnormal   Collection Time    06/21/13  6:16 AM      Result Value Range   Sodium 134 (*) 135 - 145 mEq/L   Potassium 3.5  3.5 - 5.1 mEq/L   Chloride 94 (*) 96 - 112 mEq/L   Comment: DELTA CHECK NOTED     REPEATED TO VERIFY   CO2 21  19 - 32 mEq/L   Glucose, Bld 137 (*) 70 - 99 mg/dL   BUN 14  6 - 23 mg/dL   Creatinine, Ser 4.69  0.50 - 1.10 mg/dL   Calcium 9.6  8.4 - 62.9 mg/dL   GFR calc non Af Amer 78 (*) >90 mL/min   GFR calc Af Amer >90  >90 mL/min   Comment:            The eGFR has been calculated     using the CKD EPI equation.     This calculation has not been     validated in all clinical     situations.     eGFR's persistently     <90 mL/min signify     possible Chronic Kidney Disease.  LACTIC ACID, PLASMA     Status: Abnormal   Collection Time    06/21/13  6:16 AM      Result Value Range   Lactic Acid, Venous 4.3 (*) 0.5 - 2.2 mmol/L  URINALYSIS,  ROUTINE W REFLEX MICROSCOPIC     Status: Abnormal   Collection Time    06/21/13  7:37 AM      Result Value Range   Color, Urine AMBER (*) YELLOW   Comment: BIOCHEMICALS MAY  BE AFFECTED BY COLOR   APPearance TURBID (*) CLEAR   Specific Gravity, Urine 1.024  1.005 - 1.030   pH 5.5  5.0 - 8.0   Glucose, UA NEGATIVE  NEGATIVE mg/dL   Hgb urine dipstick LARGE (*) NEGATIVE   Bilirubin Urine NEGATIVE  NEGATIVE   Ketones, ur NEGATIVE  NEGATIVE mg/dL   Protein, ur >454 (*) NEGATIVE mg/dL   Urobilinogen, UA 0.2  0.0 - 1.0 mg/dL   Nitrite NEGATIVE  NEGATIVE   Leukocytes, UA LARGE (*) NEGATIVE  URINE MICROSCOPIC-ADD ON     Status: Abnormal   Collection Time    06/21/13  7:37 AM      Result Value Range   Squamous Epithelial / LPF FEW (*) RARE   WBC, UA TOO NUMEROUS TO COUNT  <3 WBC/hpf   RBC / HPF TOO NUMEROUS TO COUNT  <3 RBC/hpf   Bacteria, UA FEW (*) RARE  CG4 I-STAT (LACTIC ACID)     Status: Abnormal   Collection Time    06/21/13 10:24 AM      Result Value Range   Lactic Acid, Venous 3.83 (*) 0.5 - 2.2 mmol/L  GLUCOSE, CAPILLARY     Status: Abnormal   Collection Time    06/21/13 10:42 AM      Result Value Range   Glucose-Capillary 181 (*) 70 - 99 mg/dL  MRSA PCR SCREENING     Status: None   Collection Time    06/21/13 11:17 AM      Result Value Range   MRSA by PCR NEGATIVE  NEGATIVE   Comment:            The GeneXpert MRSA Assay (FDA     approved for NASAL specimens     only), is one component of a     comprehensive MRSA colonization     surveillance program. It is not     intended to diagnose MRSA     infection nor to guide or     monitor treatment for     MRSA infections.  GLUCOSE, CAPILLARY     Status: Abnormal   Collection Time    06/21/13 11:47 AM      Result Value Range   Glucose-Capillary 224 (*) 70 - 99 mg/dL    Dg Abd 1 View  0/98/1191   *RADIOLOGY REPORT*  Clinical Data: Evaluate stent placement  ABDOMEN - 1 VIEW  Comparison: 06/19/2013  Findings:  Cholecystectomy clips are noted in the right upper quadrant of the abdomen.  There are bilateral Nephro ureteral stents in place.  The stents appear to be in satisfactory position. No ureteral calculi identified.  IMPRESSION:  1.  Satisfactory position of bilateral Nephro ureteral stents.   Original Report Authenticated By: Signa Kell, M.D.   Dg Retrograde Pyelogram  06/20/2013   *RADIOLOGY REPORT*  Clinical data:   Bilateral renal calculi.  The  INTRAOPERATIVE BILATERAL RETROGRADE UROGRAPHY  Comparison:  06/13/2013 intraoperative images as well as prior CT on 05/11/2013.  Technique:  Images were obtained with the C-arm fluoroscopic device intraoperatively and submitted for interpretation post-operatively. Please see the procedural report for the amount of contrast and the fluoroscopy time utilized.  Findings:  The left ureter was cannulated.  Contrast injection shows a normally patent ureter.  There is suggestion of filling defects centrally in the renal pelvis as well as a potential filling defect in the lower pole.  A ureteral stent was placed which shows appropriate positioning.  Right-sided contrast injection and shows  opacification of the renal collecting system without significant filling defect identified.  A ureteral stent was placed which shows good positioning.  IMPRESSION: Bilateral ureteral stent placement.  Contrast injection shows central filling defect at the level of left renal pelvis as well as potentially filling defect in the lower pole collecting system of the left kidney.   Original Report Authenticated By: Irish Lack, M.D.    Review of Systems  Constitutional: Positive for fever, chills, malaise/fatigue and diaphoresis.  HENT: Negative.   Eyes: Negative.   Respiratory: Negative.   Cardiovascular: Negative.   Gastrointestinal: Negative.   Genitourinary: Positive for hematuria. Negative for dysuria, urgency and flank pain.  Musculoskeletal: Negative.   Skin: Negative.    Neurological: Negative.   Endo/Heme/Allergies: Negative.   Psychiatric/Behavioral: Negative.    Blood pressure 95/57, pulse 106, temperature 98.7 F (37.1 C), temperature source Oral, resp. rate 14, height 5\' 6"  (1.676 m), weight 121.3 kg (267 lb 6.7 oz), last menstrual period 04/27/2013, SpO2 99.00%. Physical Exam  Constitutional: She is oriented to person, place, and time. She appears well-developed and well-nourished.  In ICU, husband at bedside.  HENT:  Head: Normocephalic and atraumatic.  Eyes: EOM are normal. Pupils are equal, round, and reactive to light.  Neck: Normal range of motion. Neck supple.  Cardiovascular:  Tachycardic but with palpable radial pulses bilatearlly  Respiratory: Effort normal and breath sounds normal. No respiratory distress. She has no wheezes.  GI: Soft. Bowel sounds are normal.  Genitourinary:  No CVAT  Musculoskeletal: Normal range of motion.  Neurological: She is alert and oriented to person, place, and time.  Skin: Skin is warm and dry.  Psychiatric: She has a normal mood and affect. Her behavior is normal. Judgment and thought content normal.    Assessment/Plan:  1 - Urosepsis - Agree current picture c/w urosepsis. Agree with current ABX with merrem and yeast coverage until CX final as well as ICU admission. Must assume possible new organism or even release of prior from breaking up stone (colonized stone). KUB today with stents in good position. No indication for further urologic intervention at this time.  2 - Bilateral UPJ Stones - Now clinically stone free. Current stents to remain until afebrile, back to baseline, and current CX's final.  3 - Will Follow. Appreciate PCCM.   Destony Prevost 06/21/2013, 1:50 PM

## 2013-06-21 NOTE — Progress Notes (Signed)
CARE MANAGEMENT NOTE 06/21/2013  Patient:  MAGNOLIA, MATTILA   Account Number:  [id number]  Date Initiated:  06/21/2013  Documentation initiated by:  DAVIS,RHONDA  Subjective/Objective Assessment:   pt admitted due to urosepsis,     Action/Plan:   home   Anticipated DC Date:  06/24/2013   Anticipated DC Plan:  HOME/SELF CARE  In-house referral  NA      DC Planning Services  NA      [facility] Choice  NA   Choice offered to / List presented to:  NA   DME arranged  NA      DME agency  NA     HH arranged  NA      HH agency  NA   Status of service:  In process, will continue to follow Medicare Important Message given?  NA - LOS <3 / Initial given by admissions (If response is "NO", the following Medicare IM given date fields will be blank) Date Medicare IM given:   Date Additional Medicare IM given:    Discharge Disposition:    Per UR Regulation:  Reviewed for med. necessity/level of care/duration of stay  If discussed at Long Length of Stay Meetings, dates discussed:    Comments:  [id number] Earlene Plater, RN, BSN, CCM:  CHART REVIEWED AND UPDATED.  Next chart review due on [id number]. NO DISCHARGE NEEDS PRESENT AT THIS TIME. CASE MANAGEMENT [phone]

## 2013-06-21 NOTE — Progress Notes (Signed)
INITIAL NUTRITION ASSESSMENT  DOCUMENTATION CODES Per approved criteria  -Morbid Obesity   INTERVENTION: Encourage PO intake >75% of 3 meals daily with adequate protein Provide Multivitamin with minerals daily  NUTRITION DIAGNOSIS: Unintentional wt loss related to poor appetite/medical problems as evidenced by 9% wt loss in less than one month.   Goal: Pt to meet >/= 90% of their estimated nutrition needs  Monitor:  PO intake Weight Labs  Reason for Assessment: Malnutrition Screening Tool, score of 2  47 y.o. female  Admitting Dx: Urosepsis  ASSESSMENT: 47 y/o F with PMH of recurrent renal calculi s/p stenting on 7/17. Present[facility] to [facility] ER 7/17 with onset of fevers / chills post stenting. Found to be hypotensive with lactate of ~4.  Pt reports that she usually weighs 281 lbs but, for the past month she has had a poor appetite and been eating about 50% less than she usually does. Pt states that her weight has dropped to 252 lbs PTA. Pt reports that she did not eat breakfast and ate about 50% of her meal at lunch. Discussed the importance of nutrition for pt's health and discouraged rapid weight loss due to risk of malnutrition but, slow weight loss appropriate due to morbid obesity. Encouraged pt to get adequate protein daily while losing weight. Encouraged po intake of >75% of 3 meals daily. Pt not interested in nutritional supplements at this time. Pt has no questions or concerns.  Height: Ht Readings from Last 1 Encounters:  06/21/13 5\' 6"  (1.676 m)    Weight: Wt Readings from Last 1 Encounters:  06/21/13 267 lb 6.7 oz (121.3 kg)    Ideal Body Weight: 130 lbs  % Ideal Body Weight: 205%  Wt Readings from Last 10 Encounters:  06/21/13 267 lb 6.7 oz (121.3 kg)  06/20/13 263 lb 5 oz (119.438 kg)  06/20/13 263 lb 5 oz (119.438 kg)  06/13/13 256 lb 5 oz (116.263 kg)  06/13/13 256 lb 5 oz (116.263 kg)  05/13/13 281 lb 12 oz (127.8 kg)  05/13/13 281 lb 12 oz (127.8 kg)   04/16/13 263 lb (119.296 kg)    Usual Body Weight: 281 lbs  % Usual Body Weight: 95%  BMI:  Body mass index is 43.18 kg/(m^2).  Estimated Nutritional Needs: Kcal: 1900-2100 Protein: 112-120 grams Fluid: 3.1 L  Skin: 2 incisions of perineum  Diet Order: Carb Control  EDUCATION NEEDS: -No education needs identified at this time   Intake/Output Summary (Last 24 hours) at 06/21/13 1615 Last data filed at 06/21/13 1413  Gross per 24 hour  Intake    325 ml  Output    550 ml  Net   -225 ml    Last BM: PTA   Labs:   Recent Labs Lab 06/20/13 0916 06/21/13 0616  NA 135 134*  K 4.0 3.5  CL 105 94*  CO2  --  21  BUN 12 14  CREATININE 0.80 0.86  CALCIUM  --  9.6  GLUCOSE 132* 137*    CBG (last 3)   Recent Labs  06/20/13 1258 06/21/13 1042 06/21/13 1147  GLUCAP 178* 181* 224*    Scheduled Meds: . acetaminophen  650 mg Oral Once  . buPROPion  75 mg Oral BID  . citalopram  40 mg Oral Daily  . enoxaparin (LOVENOX) injection  40 mg Subcutaneous Daily  . ferrous sulfate  325 mg Oral BID WC  . fluconazole  150 mg Oral Daily  . insulin aspart  0-15 Units Subcutaneous Q4H  .  lamoTRIgine  200 mg Oral Daily   And  . lamoTRIgine  100 mg Oral QHS  . meropenem (MERREM) IV  1 g Intravenous Q8H  . senna-docusate  1 tablet Oral BID    Continuous Infusions: . sodium chloride 1,000 mL (06/21/13 1100)  . sodium chloride 100 mL (06/21/13 1148)    Past Medical History  Diagnosis Date  . Hypertension   . Anxiety   . Depression   . Fibromyalgia   . Hyperlipidemia   . Seasonal asthma   . Diabetes mellitus, type 2   . CKD (chronic kidney disease), stage III   . Wears glasses   . Arthritis     TAILBONE AREA  . Iron deficiency anemia   . Bilateral hydronephrosis   . History of sepsis     05-11-2013  W/ UROSEPSIS---  RESOLIVED  . Normal cardiac stress test     PER PT 2007  . Atrophic kidney     LEFT  . Renal calculi     bilateral    Past Surgical  History  Procedure Laterality Date  . Coccyx removal  2009  . Hysteroscopy w/d&c N/A 04/20/2013    Procedure: DILATATION AND CURETTAGE /HYSTEROSCOPY;  Surgeon: Zelphia Cairo, MD;  Location: [facility];  Service: Gynecology;  Laterality: N/A;  . Polypectomy N/A 04/20/2013    Procedure: POLYPECTOMY;  Surgeon: Zelphia Cairo, MD;  Location: [facility];  Service: Gynecology;  Laterality: N/A;  . Cystoscopy with stent placement Bilateral 05/11/2013    Procedure: CYSTOSCOPY WITH STENT PLACEMENT;  Surgeon: Sebastian Ache, MD;  Location: [facility];  Service: Urology;  Laterality: Bilateral;  . Laparoscopic cholecystectomy  AGE 25  . Appendectomy  AGE 35  . Cystoscopy with retrograde pyelogram, ureteroscopy and stent placement Right 06/13/2013    Procedure: First Stage Bilateral Ureteroscopy;  Surgeon: Sebastian Ache, MD;  Location: [facility];  Service: Urology;  Laterality: Right;  . Holmium laser application Right 06/13/2013    Procedure: HOLMIUM LASER APPLICATION, right ureteral stone;  Surgeon: Sebastian Ache, MD;  Location: [facility];  Service: Urology;  Laterality: Right;  . Cystoscopy w/ ureteral stent placement Right 06/13/2013    Procedure: CYSTOSCOPY WITH STENT REPLACEMENT, right;  Surgeon: Sebastian Ache, MD;  Location: [facility];  Service: Urology;  Laterality: Right;  . Cystoscopy with retrograde pyelogram, ureteroscopy and stent placement Bilateral 06/20/2013    Procedure: CYSTOSCOPY WITH RETROGRADE PYELOGRAM, URETEROSCOPY AND STENT PLACEMENT, REMOVAL BILATERAL STENTS;  Surgeon: Sebastian Ache, MD;  Location: [facility];  Service: Urology;  Laterality: Bilateral;  Bilateral stent repalcement with not string  . Holmium laser application Right 06/20/2013    Procedure: HOLMIUM LASER APPLICATION;  Surgeon: Sebastian Ache, MD;  Location: [facility];  Service: Urology;  Laterality: Right;    Ian Malkin RD, LDN Inpatient Clinical  Dietitian Pager: [phone] After Hours Pager: [phone]

## 2013-06-21 NOTE — ED Provider Notes (Signed)
History    CSN: [id number] Arrival date & time 06/21/13  0542  First MD Initiated Contact with Patient 06/21/13 [phone]     Chief Complaint  Patient presents with  . Fever   (Consider location/radiation/quality/duration/timing/severity/associated sxs/prior Treatment) Patient is a 47 y.o. female presenting with fever. The history is provided by the patient. No language interpreter was used.  Fever Associated symptoms: chills and nausea   Associated symptoms: no chest pain, no confusion, no dysuria, no headaches, no rash and no vomiting   Associated symptoms comment:  Fever since this morning associated with nausea. She has had multiple recent procedures for problematic kidney stones requiring stenting. On June 6th, she had bilateral stenting pending further procedures to relieve hydronephrosis secondary to bilateral 12mm kidney stones. On July 9th, the right stent was replaced and yesterday the left stent was replaced. She has undergone lithotripsy and basket retrieval as well. She woke early this morning with a fever and chills which is a similar presentation to previous recent uroseptic illness. She has nausea but denies vomiting.   Past Medical History  Diagnosis Date  . Hypertension   . Anxiety   . Depression   . Fibromyalgia   . Hyperlipidemia   . Seasonal asthma   . Diabetes mellitus, type 2   . CKD (chronic kidney disease), stage III   . Wears glasses   . Arthritis     TAILBONE AREA  . Iron deficiency anemia   . Bilateral hydronephrosis   . History of sepsis     05-11-2013  W/ UROSEPSIS---  RESOLIVED  . Normal cardiac stress test     PER PT 2007  . Atrophic kidney     LEFT  . Renal calculi     bilateral   Past Surgical History  Procedure Laterality Date  . Coccyx removal  2009  . Hysteroscopy w/d&c N/A 04/20/2013    Procedure: DILATATION AND CURETTAGE /HYSTEROSCOPY;  Surgeon: Zelphia Cairo, MD;  Location: [facility];  Service: Gynecology;  Laterality: N/A;  .  Polypectomy N/A 04/20/2013    Procedure: POLYPECTOMY;  Surgeon: Zelphia Cairo, MD;  Location: [facility];  Service: Gynecology;  Laterality: N/A;  . Cystoscopy with stent placement Bilateral 05/11/2013    Procedure: CYSTOSCOPY WITH STENT PLACEMENT;  Surgeon: Sebastian Ache, MD;  Location: [facility];  Service: Urology;  Laterality: Bilateral;  . Laparoscopic cholecystectomy  AGE 15  . Appendectomy  AGE 72  . Cystoscopy with retrograde pyelogram, ureteroscopy and stent placement Right 06/13/2013    Procedure: First Stage Bilateral Ureteroscopy;  Surgeon: Sebastian Ache, MD;  Location: [facility];  Service: Urology;  Laterality: Right;  . Holmium laser application Right 06/13/2013    Procedure: HOLMIUM LASER APPLICATION, right ureteral stone;  Surgeon: Sebastian Ache, MD;  Location: [facility];  Service: Urology;  Laterality: Right;  . Cystoscopy w/ ureteral stent placement Right 06/13/2013    Procedure: CYSTOSCOPY WITH STENT REPLACEMENT, right;  Surgeon: Sebastian Ache, MD;  Location: [facility];  Service: Urology;  Laterality: Right;   History reviewed. No pertinent family history. History  Substance Use Topics  . Smoking status: Never Smoker   . Smokeless tobacco: Never Used  . Alcohol Use: No   OB History   Grav Para Term Preterm Abortions TAB SAB Ect Mult Living                 Review of Systems  Constitutional: Positive for fever and chills.  HENT: Negative for neck  pain and neck stiffness.   Respiratory: Negative for shortness of breath.   Cardiovascular: Negative for chest pain.  Gastrointestinal: Positive for nausea. Negative for vomiting and abdominal pain.  Genitourinary: Positive for hematuria. Negative for dysuria.  Musculoskeletal: Negative for back pain.  Skin: Negative for rash.  Neurological: Negative for weakness and headaches.  Hematological: Does not bruise/bleed easily.  Psychiatric/Behavioral: Negative for confusion.     Allergies  Review of patient's allergies indicates no known allergies.  Home Medications   Current Outpatient Rx  Name  Route  Sig  Dispense  Refill  . ALBUTEROL IN   Inhalation   Inhale into the lungs as needed.         Marland Kitchen atorvastatin (LIPITOR) 80 MG tablet   Oral   Take 40 mg by mouth every morning.          Marland Kitchen buPROPion (WELLBUTRIN) 75 MG tablet   Oral   Take 75 mg by mouth 2 (two) times daily.         . citalopram (CELEXA) 40 MG tablet   Oral   Take 40 mg by mouth daily.         . ferrous sulfate 325 (65 FE) MG tablet   Oral   Take 325 mg by mouth 2 (two) times daily with a meal.         . hydrochlorothiazide (HYDRODIURIL) 25 MG tablet   Oral   Take 12.5 mg by mouth daily.         Marland Kitchen lamoTRIgine (LAMICTAL) 200 MG tablet   Oral   Take 100-200 mg by mouth 2 (two) times daily. Takes 200 mg in am and 100 mg in pm         . lisinopril (PRINIVIL,ZESTRIL) 20 MG tablet   Oral   Take 20 mg by mouth daily.         Marland Kitchen LORazepam (ATIVAN) 0.5 MG tablet   Oral   Take 0.5 mg by mouth every 8 (eight) hours as needed for anxiety.         . metFORMIN (GLUCOPHAGE) 1000 MG tablet   Oral   Take 1,000 mg by mouth 2 (two) times daily with a meal.         . omega-3 acid ethyl esters (LOVAZA) 1 G capsule   Oral   Take 1 g by mouth 2 (two) times daily.          Marland Kitchen oxyCODONE-acetaminophen (PERCOCET) 7.5-325 MG per tablet   Oral   Take 1 tablet by mouth every 6 (six) hours as needed for pain.   30 tablet   0   . phenazopyridine (PYRIDIUM) 200 MG tablet   Oral   Take 200 mg by mouth 3 (three) times daily as needed for pain.         . promethazine (PHENERGAN) 12.5 MG tablet   Oral   Take 2 tablets (25 mg total) by mouth every 6 (six) hours as needed for nausea.   30 tablet   1   . sennosides-docusate sodium (SENOKOT-S) 8.6-50 MG tablet   Oral   Take 1 tablet by mouth 2 (two) times daily. While taking pain meds to prevent constipation.   30  tablet   1   . sitaGLIPtin (JANUVIA) 50 MG tablet   Oral   Take 50 mg by mouth daily.         Marland Kitchen sulfamethoxazole-trimethoprim (BACTRIM DS) 800-160 MG per tablet   Oral   Take 1 tablet by  mouth daily. X 3 days now. Also take daily for 3 days starting day before next Urology appointment.   6 tablet   0   . Vitamin D, Ergocalciferol, (DRISDOL) 50000 UNITS CAPS   Oral   Take 50,000 Units by mouth every 7 (seven) days. Sunday          BP 142/62  Pulse 142  Temp(Src) 100.3 F (37.9 C) (Oral)  Resp 20  SpO2 96%  LMP 04/19/2013 Physical Exam  Constitutional: She is oriented to person, place, and time. She appears well-developed and well-nourished.  HENT:  Head: Normocephalic.  Neck: Normal range of motion. Neck supple.  Cardiovascular: Regular rhythm.  Tachycardia present.   Pulmonary/Chest: Effort normal and breath sounds normal.  Abdominal: Soft. Bowel sounds are normal. There is no tenderness. There is no rebound and no guarding.  Musculoskeletal: Normal range of motion.  Neurological: She is alert and oriented to person, place, and time.  Skin: Skin is warm and dry. No rash noted.  Psychiatric: She has a normal mood and affect.    ED Course  Procedures (including critical care time) CRITICAL CARE Performed by: Elpidio Anis A   Total critical care time: 45  Critical care time was exclusive of separately billable procedures and treating other patients.  Critical care was necessary to treat or prevent imminent or life-threatening deterioration.  Critical care was time spent personally by me on the following activities: development of treatment plan with patient and/or surrogate as well as nursing, discussions with consultants, evaluation of patient's response to treatment, examination of patient, obtaining history from patient or surrogate, ordering and performing treatments and interventions, ordering and review of laboratory studies, ordering and review of  radiographic studies, pulse oximetry and re-evaluation of patient's condition.   Labs Reviewed  CULTURE, BLOOD (ROUTINE X 2)  CULTURE, BLOOD (ROUTINE X 2)  CBC WITH DIFFERENTIAL  BASIC METABOLIC PANEL  URINALYSIS, ROUTINE W REFLEX MICROSCOPIC  LACTIC ACID, PLASMA   Results for orders placed during the hospital encounter of 06/21/13  CULTURE, BLOOD (ROUTINE X 2)      Result Value Range   Specimen Description BLOOD LEFT ANTECUBITAL     Special Requests BOTTLES DRAWN AEROBIC AND ANAEROBIC 10CC     Culture  Setup Time 06/21/2013 09:32     Culture       Value: GRAM NEGATIVE RODS     Note: Gram Stain Report Called to,Read Back By and Verified With: ANGELA ASHLEY@[id number]  ON [id number] BY [facility]   Report Status PENDING    CULTURE, BLOOD (ROUTINE X 2)      Result Value Range   Specimen Description BLOOD LEFT HAND     Special Requests BOTTLES DRAWN AEROBIC AND ANAEROBIC     Culture  Setup Time 06/21/2013 09:32     Culture       Value:        BLOOD CULTURE RECEIVED NO GROWTH TO DATE CULTURE WILL BE HELD FOR 5 DAYS BEFORE ISSUING A FINAL NEGATIVE REPORT   Report Status PENDING    MRSA PCR SCREENING      Result Value Range   MRSA by PCR NEGATIVE  NEGATIVE  CBC WITH DIFFERENTIAL      Result Value Range   WBC 20.6 (*) 4.0 - 10.5 K/uL   RBC 4.73  3.87 - 5.11 MIL/uL   Hemoglobin 12.6  12.0 - 15.0 g/dL   HCT 82.9  56.2 - 13.0 %   MCV 81.4  78.0 - 100.0 fL  MCH 26.6  26.0 - 34.0 pg   MCHC 32.7  30.0 - 36.0 g/dL   RDW 16.1 (*) 09.6 - 04.5 %   Platelets 289  150 - 400 K/uL   Neutrophils Relative % 90 (*) 43 - 77 %   Neutro Abs 18.6 (*) 1.7 - 7.7 K/uL   Lymphocytes Relative 6 (*) 12 - 46 %   Lymphs Abs 1.3  0.7 - 4.0 K/uL   Monocytes Relative 3  3 - 12 %   Monocytes Absolute 0.7  0.1 - 1.0 K/uL   Eosinophils Relative 0  0 - 5 %   Eosinophils Absolute 0.0  0.0 - 0.7 K/uL   Basophils Relative 0  0 - 1 %   Basophils Absolute 0.0  0.0 - 0.1 K/uL  BASIC METABOLIC PANEL      Result Value Range    Sodium 134 (*) 135 - 145 mEq/L   Potassium 3.5  3.5 - 5.1 mEq/L   Chloride 94 (*) 96 - 112 mEq/L   CO2 21  19 - 32 mEq/L   Glucose, Bld 137 (*) 70 - 99 mg/dL   BUN 14  6 - 23 mg/dL   Creatinine, Ser 4.09  0.50 - 1.10 mg/dL   Calcium 9.6  8.4 - 81.1 mg/dL   GFR calc non Af Amer 78 (*) >90 mL/min   GFR calc Af Amer >90  >90 mL/min  URINALYSIS, ROUTINE W REFLEX MICROSCOPIC      Result Value Range   Color, Urine AMBER (*) YELLOW   APPearance TURBID (*) CLEAR   Specific Gravity, Urine 1.024  1.005 - 1.030   pH 5.5  5.0 - 8.0   Glucose, UA NEGATIVE  NEGATIVE mg/dL   Hgb urine dipstick LARGE (*) NEGATIVE   Bilirubin Urine NEGATIVE  NEGATIVE   Ketones, ur NEGATIVE  NEGATIVE mg/dL   Protein, ur >914 (*) NEGATIVE mg/dL   Urobilinogen, UA 0.2  0.0 - 1.0 mg/dL   Nitrite NEGATIVE  NEGATIVE   Leukocytes, UA LARGE (*) NEGATIVE  LACTIC ACID, PLASMA      Result Value Range   Lactic Acid, Venous 4.3 (*) 0.5 - 2.2 mmol/L  URINE MICROSCOPIC-ADD ON      Result Value Range   Squamous Epithelial / LPF FEW (*) RARE   WBC, UA TOO NUMEROUS TO COUNT  <3 WBC/hpf   RBC / HPF TOO NUMEROUS TO COUNT  <3 RBC/hpf   Bacteria, UA FEW (*) RARE  GLUCOSE, CAPILLARY      Result Value Range   Glucose-Capillary 181 (*) 70 - 99 mg/dL  GLUCOSE, CAPILLARY      Result Value Range   Glucose-Capillary 224 (*) 70 - 99 mg/dL  CBC      Result Value Range   WBC 16.7 (*) 4.0 - 10.5 K/uL   RBC 3.84 (*) 3.87 - 5.11 MIL/uL   Hemoglobin 10.1 (*) 12.0 - 15.0 g/dL   HCT 78.2 (*) 95.6 - 21.3 %   MCV 82.8  78.0 - 100.0 fL   MCH 26.3  26.0 - 34.0 pg   MCHC 31.8  30.0 - 36.0 g/dL   RDW 08.6 (*) 57.8 - 46.9 %   Platelets 281  150 - 400 K/uL  BASIC METABOLIC PANEL      Result Value Range   Sodium 133 (*) 135 - 145 mEq/L   Potassium 3.2 (*) 3.5 - 5.1 mEq/L   Chloride 97  96 - 112 mEq/L   CO2 24  19 -  32 mEq/L   Glucose, Bld 241 (*) 70 - 99 mg/dL   BUN 10  6 - 23 mg/dL   Creatinine, Ser 6.29  0.50 - 1.10 mg/dL   Calcium  8.6  8.4 - 52.8 mg/dL   GFR calc non Af Amer 73 (*) >90 mL/min   GFR calc Af Amer 84 (*) >90 mL/min  GLUCOSE, CAPILLARY      Result Value Range   Glucose-Capillary 188 (*) 70 - 99 mg/dL  GLUCOSE, CAPILLARY      Result Value Range   Glucose-Capillary 159 (*) 70 - 99 mg/dL  GLUCOSE, CAPILLARY      Result Value Range   Glucose-Capillary 197 (*) 70 - 99 mg/dL   Comment 1 Documented in Chart     Comment 2 Notify RN    CG4 I-STAT (LACTIC ACID)      Result Value Range   Lactic Acid, Venous 3.83 (*) 0.5 - 2.2 mmol/L    Dg Retrograde Pyelogram  06/20/2013   *RADIOLOGY REPORT*  Clinical data:   Bilateral renal calculi.  The  INTRAOPERATIVE BILATERAL RETROGRADE UROGRAPHY  Comparison:  06/13/2013 intraoperative images as well as prior CT on 05/11/2013.  Technique:  Images were obtained with the C-arm fluoroscopic device intraoperatively and submitted for interpretation post-operatively. Please see the procedural report for the amount of contrast and the fluoroscopy time utilized.  Findings:  The left ureter was cannulated.  Contrast injection shows a normally patent ureter.  There is suggestion of filling defects centrally in the renal pelvis as well as a potential filling defect in the lower pole.  A ureteral stent was placed which shows appropriate positioning.  Right-sided contrast injection and shows opacification of the renal collecting system without significant filling defect identified.  A ureteral stent was placed which shows good positioning.  IMPRESSION: Bilateral ureteral stent placement.  Contrast injection shows central filling defect at the level of left renal pelvis as well as potentially filling defect in the lower pole collecting system of the left kidney.   Original Report Authenticated By: Irish Lack, M.D.   No diagnosis found. 1. Urosepsis  MDM  She has remained comfortable, symptoms treated. Records reviewed. She was found to have a pansensitive Klebsiella UTI recently treated  with Cipro. IV cipro started. On multiple frequent rechecks, her blood pressure dropped to 98/59 without symptoms. Improved with IV fluids. Patient remains tachycardic, brittle blood pressure, history of urosepsis and elevated Lactic acid presents concern for pending instability. Admitted by PCCM, Dr. Berneice Heinrich has been advised and will consult.  Arnoldo Hooker, PA-C 06/22/13 1244

## 2013-06-21 NOTE — Progress Notes (Addendum)
ANTIBIOTIC CONSULT NOTE - INITIAL  Pharmacy Consult for meropenem/vancomycin Indication: urosepsis  No Known Allergies  Patient Measurements:     Vital Signs: Temp: 101.4 F (38.6 C) (07/17 0813) Temp src: Oral (07/17 0813) BP: 96/46 mmHg (07/17 0900) Pulse Rate: 123 (07/17 0900) Intake/Output from previous day:   Intake/Output from this shift:    Labs:  Recent Labs  06/20/13 0916 06/21/13 0616  WBC  --  20.6*  HGB 11.9* 12.6  PLT  --  289  CREATININE 0.80 0.86   The CrCl is unknown because both a height and weight (above a minimum accepted value) are required for this calculation. No results found for this basename: VANCOTROUGH, VANCOPEAK, VANCORANDOM, GENTTROUGH, GENTPEAK, GENTRANDOM, TOBRATROUGH, TOBRAPEAK, TOBRARND, AMIKACINPEAK, AMIKACINTROU, AMIKACIN,  in the last 72 hours   Microbiology: No results found for this or any previous visit (from the past 720 hour(s)).  Medical History: Past Medical History  Diagnosis Date  . Hypertension   . Anxiety   . Depression   . Fibromyalgia   . Hyperlipidemia   . Seasonal asthma   . Diabetes mellitus, type 2   . CKD (chronic kidney disease), stage III   . Wears glasses   . Arthritis     TAILBONE AREA  . Iron deficiency anemia   . Bilateral hydronephrosis   . History of sepsis     05-11-2013  W/ UROSEPSIS---  RESOLIVED  . Normal cardiac stress test     PER PT 2007  . Atrophic kidney     LEFT  . Renal calculi     bilateral    Medications:  Scheduled:  . acetaminophen  650 mg Oral Once  . aspirin  324 mg Oral NOW   Or  . aspirin  300 mg Rectal NOW  . buPROPion  75 mg Oral BID  . citalopram  40 mg Oral Daily  . enoxaparin (LOVENOX) injection  40 mg Subcutaneous Daily  . ferrous sulfate  325 mg Oral BID WC  . fluconazole  150 mg Oral Daily  . insulin aspart  0-15 Units Subcutaneous Q4H  . lamoTRIgine  200 mg Oral Daily   And  . lamoTRIgine  100 mg Oral QHS  . sennosides-docusate sodium  1 tablet Oral  BID   Infusions:  . sodium chloride 1,000 mL (06/21/13 0807)  . sodium chloride    . sodium chloride    . meropenem (MERREM) IV    . meropenem (MERREM) IV     Assessment: 47 yo female presents to ER with fever, chills, and nausea. She has had multiple recent procedures for problematic kidney stones with most recent being cystoscopy with retrograde pyelogram, ureteroscopy and stent placement with replacement of bilateral stents yesterday 7/16. Patient was  being treated for klebsiella UTI from 05/11/2013 with Cipro per urology notes and was started on Bactrim but only took 1 dose 7/16. For this admission, Cipro 400mg  IV has been given in ER, Vanc 1g x 1 has been ordered, and to begin Meropenem and vancomycin per pharmacy dosing for urosepsis  Estimated normalized CrCl 90 ml/min/1.9m2 and weight of 119kg  Elevated WBC   Goal of Therapy:  eradication of infection  Plan:  1) Meropenem 1g IV q8 2) Vancomycin 1g given in ER at 0819 - give 1g more to = 2g for loading dose then start 1g IV q12 thereafter.    Hessie Knows, PharmD, BCPS Pager [phone] 06/21/2013 10:24 AM

## 2013-06-21 NOTE — H&P (Signed)
PULMONARY  / CRITICAL CARE MEDICINE  Name: Grace Singh MRN: [id number] DOB: 12-06-1966    ADMISSION DATE:  06/21/2013  REFERRING MD :  Dr. Patria Mane PRIMARY SERVICE: PCCM  CHIEF COMPLAINT:  Urosepsis  BRIEF PATIENT DESCRIPTION: 47 y/o F with PMH of recurrent renal calculi s/p stenting on 7/17.  Presented to [facility] ER 7/17 with onset of fevers / chills post stenting.  Found to be hypotensive with lactate of ~4.   SIGNIFICANT EVENTS / STUDIES:  6/06 - Emergent cysto & bilateral stent placement for UPK stones (12mm ea).  Rx with Cipro for Klebsiella UTI 7/16 - Cystoscopy with removal of bilateral stents and replacement, laser of R stone.  D/C'd on Bactrim PO.    LINES / TUBES:   CULTURES: UC 7/17>>> BCx2 7/17>>>  ANTIBIOTICS: Meropenem 7/17>>> Diflucan 7/17>>> Vanco 7/17>>> Cipro 7/17>>>7/17  HISTORY OF PRESENT ILLNESS:  47 y/o F with PMH of HTN, Anxiety / Depression, HLD, DM, Atrophy of L kidney, and bilateral recurrent renal calculi s/p emergent cysto & stenting in 05/2013, and repeat stenting on 7/17.  Patient reports she woke at 0300 with body aches, chills, fever.  Had taken one dose of oral bactrim.  She presented to [facility] ER 7/17 with onset of fevers / chills post stenting.  Work up noted initial lactic acid of 4.3, nml sr cr, WBC of 20.6 with 90% neutrophils, and urine with large leukocytes, WBC TNTC and few bacteria.  Initial BP 142/62 with low of 96/46 (pt lying on side), Tmax of 101.4, HR in 120's.    PAST MEDICAL HISTORY :  Past Medical History  Diagnosis Date  . Hypertension   . Anxiety   . Depression   . Fibromyalgia   . Hyperlipidemia   . Seasonal asthma   . Diabetes mellitus, type 2   . CKD (chronic kidney disease), stage III   . Wears glasses   . Arthritis     TAILBONE AREA  . Iron deficiency anemia   . Bilateral hydronephrosis   . History of sepsis     05-11-2013  W/ UROSEPSIS---  RESOLIVED  . Normal cardiac stress test     PER PT 2007  . Atrophic kidney      LEFT  . Renal calculi     bilateral   Past Surgical History  Procedure Laterality Date  . Coccyx removal  2009  . Hysteroscopy w/d&c N/A 04/20/2013    Procedure: DILATATION AND CURETTAGE /HYSTEROSCOPY;  Surgeon: Zelphia Cairo, MD;  Location: [facility];  Service: Gynecology;  Laterality: N/A;  . Polypectomy N/A 04/20/2013    Procedure: POLYPECTOMY;  Surgeon: Zelphia Cairo, MD;  Location: [facility];  Service: Gynecology;  Laterality: N/A;  . Cystoscopy with stent placement Bilateral 05/11/2013    Procedure: CYSTOSCOPY WITH STENT PLACEMENT;  Surgeon: Sebastian Ache, MD;  Location: [facility];  Service: Urology;  Laterality: Bilateral;  . Laparoscopic cholecystectomy  AGE 89  . Appendectomy  AGE 58  . Cystoscopy with retrograde pyelogram, ureteroscopy and stent placement Right 06/13/2013    Procedure: First Stage Bilateral Ureteroscopy;  Surgeon: Sebastian Ache, MD;  Location: [facility];  Service: Urology;  Laterality: Right;  . Holmium laser application Right 06/13/2013    Procedure: HOLMIUM LASER APPLICATION, right ureteral stone;  Surgeon: Sebastian Ache, MD;  Location: [facility];  Service: Urology;  Laterality: Right;  . Cystoscopy w/ ureteral stent placement Right 06/13/2013    Procedure: CYSTOSCOPY WITH STENT REPLACEMENT, right;  Surgeon: Sebastian Ache, MD;  Location: [facility];  Service: Urology;  Laterality: Right;  . Cystoscopy with retrograde pyelogram, ureteroscopy and stent placement Bilateral 06/20/2013    Procedure: CYSTOSCOPY WITH RETROGRADE PYELOGRAM, URETEROSCOPY AND STENT PLACEMENT, REMOVAL BILATERAL STENTS;  Surgeon: Sebastian Ache, MD;  Location: [facility];  Service: Urology;  Laterality: Bilateral;  Bilateral stent repalcement with not string  . Holmium laser application Right 06/20/2013    Procedure: HOLMIUM LASER APPLICATION;  Surgeon: Sebastian Ache, MD;  Location: [facility];  Service: Urology;   Laterality: Right;   Prior to Admission medications   Medication Sig Start Date End Date Taking? Authorizing Provider  atorvastatin (LIPITOR) 80 MG tablet Take 40 mg by mouth every morning.    Yes Historical Provider, MD  buPROPion (WELLBUTRIN) 75 MG tablet Take 75 mg by mouth 2 (two) times daily.   Yes Historical Provider, MD  citalopram (CELEXA) 40 MG tablet Take 40 mg by mouth daily.   Yes Historical Provider, MD  ferrous sulfate 325 (65 FE) MG tablet Take 325 mg by mouth 2 (two) times daily with a meal.   Yes Historical Provider, MD  hydrochlorothiazide (HYDRODIURIL) 25 MG tablet Take 12.5 mg by mouth daily.   Yes Historical Provider, MD  lamoTRIgine (LAMICTAL) 200 MG tablet Take 100-200 mg by mouth 2 (two) times daily. Takes 200 mg in am and 100 mg in pm   Yes Historical Provider, MD  lisinopril (PRINIVIL,ZESTRIL) 20 MG tablet Take 20 mg by mouth daily.   Yes Historical Provider, MD  metFORMIN (GLUCOPHAGE) 1000 MG tablet Take 1,000 mg by mouth 2 (two) times daily with a meal.   Yes Historical Provider, MD  omega-3 acid ethyl esters (LOVAZA) 1 G capsule Take 1 g by mouth 2 (two) times daily.    Yes Historical Provider, MD  oxyCODONE-acetaminophen (PERCOCET) 7.5-325 MG per tablet Take 1 tablet by mouth every 6 (six) hours as needed for pain. 06/20/13  Yes Sebastian Ache, MD  phenazopyridine (PYRIDIUM) 200 MG tablet Take 200 mg by mouth 3 (three) times daily as needed for pain. 05/13/13  Yes Kathi Ludwig, MD  promethazine (PHENERGAN) 12.5 MG tablet Take 2 tablets (25 mg total) by mouth every 6 (six) hours as needed for nausea. 06/13/13  Yes Sebastian Ache, MD  sennosides-docusate sodium (SENOKOT-S) 8.6-50 MG tablet Take 1 tablet by mouth 2 (two) times daily. While taking pain meds to prevent constipation. 06/13/13  Yes Sebastian Ache, MD  sitaGLIPtin (JANUVIA) 50 MG tablet Take 50 mg by mouth daily.   Yes Historical Provider, MD  Vitamin D, Ergocalciferol, (DRISDOL) 50000 UNITS CAPS Take 50,000  Units by mouth every 7 (seven) days. Sunday   Yes Historical Provider, MD  ALBUTEROL IN Inhale into the lungs as needed.    Historical Provider, MD  LORazepam (ATIVAN) 0.5 MG tablet Take 0.5 mg by mouth every 8 (eight) hours as needed for anxiety.    Historical Provider, MD  sulfamethoxazole-trimethoprim (BACTRIM DS) 800-160 MG per tablet Take 1 tablet by mouth daily. X 3 days now. Also take daily for 3 days starting day before next Urology appointment. 06/20/13   Sebastian Ache, MD   No Known Allergies  FAMILY HISTORY:  History reviewed. No pertinent family history.  SOCIAL HISTORY:  reports that she has never smoked. She has never used smokeless tobacco. She reports that she does not drink alcohol or use illicit drugs.  REVIEW OF SYSTEMS:   Gen: Denies weight change, fatigue, night sweats.  Reports fevers, chills,  maliase HEENT: Denies blurred vision, double vision, hearing loss, tinnitus, sinus congestion, rhinorrhea, sore throat, neck stiffness, dysphagia PULM: Denies shortness of breath, cough, sputum production, hemoptysis, wheezing CV: Denies chest pain, edema, orthopnea, paroxysmal nocturnal dyspnea, palpitations GI: Denies abdominal pain, nausea, vomiting, diarrhea, hematochezia, melena, constipation, change in bowel habits GU: Denies dysuria, hematuria, polyuria, oliguria, urethral discharge Endocrine: Denies hot or cold intolerance, polyuria, polyphagia or appetite change Derm: Denies rash, dry skin, scaling or peeling skin change Heme: Denies easy bruising, bleeding, bleeding gums Neuro: Denies headache, numbness, weakness, slurred speech, loss of memory or consciousness   SUBJECTIVE: Patient reports feeling better than when she came into ER.  "I think my fever has broke"  VITAL SIGNS: Temp:  [97.5 F (36.4 C)-101.4 F (38.6 C)] 101.4 F (38.6 C) (07/17 0813) Pulse Rate:  [88-142] 122 (07/17 0847) Resp:  [13-24] 23 (07/17 0847) BP: (98-142)/(49-80) 105/60 mmHg (07/17  0847) SpO2:  [93 %-98 %] 97 % (07/17 0847)  INTAKE / OUTPUT: Intake/Output   None     PHYSICAL EXAMINATION: General:  wdwn adult female in NAD Neuro:  AAOx4, speech clear, MAE HEENT:  Mm pink/moist, good dentition, face flushed / sweating Cardiovascular:  s1s2 rrr, no m/r/g, tachy Lungs:  resp's even/non-labored, lungs bilaterally distant but clear Abdomen:  Obese / soft, bsx4 active Musculoskeletal:  No acute deformities, ambulates without difficulty  Skin: moist, warm, no edema  LABS:  Recent Labs Lab 06/20/13 0916 06/21/13 0616  HGB 11.9* 12.6  WBC  --  20.6*  PLT  --  289  NA 135 134*  K 4.0 3.5  CL 105 94*  CO2  --  21  GLUCOSE 132* 137*  BUN 12 14  CREATININE 0.80 0.86  CALCIUM  --  9.6  LATICACIDVEN  --  4.3*    Recent Labs Lab 06/20/13 1258  GLUCAP 178*    XRay:  7/17 KUB >> satisfactory position of bilateral nephro ureteral stents  ASSESSMENT / PLAN:  RENAL A:   Bilateral Renal Calculi s/p Stenting 7/17 - hx of emergent stent in 05/2013 with urosepsis, and repeat cysto 7/17 with bilateral replacement of stents and laser of R stone Atrophy of Left Kidney  P:   - Stent placement verified with KUB -Dr. Berneice Heinrich aware of admission - See ID section  INFECTIOUS A:   Sepsis - urinary source, hx of klebsiella UTI 05/11/13 (sens rocephin, cipro, zosyn).  Has been treated with Cipro & Bactrim in past month. UTI Leukocytosis   P:   - Continue meropenem.  Will need 10-14 days therapy.  Had taken only one dose of bactrim prior to this admit.  - Will d/c further vanco - Volume resuscitation, if not clearing lactate or decompensates, will need placement of central line - Repeat lactic acid now - Follow UC, BC  CARDIOVASCULAR A:  Sinus Tachycardia HTN P:  - Tele monitor  - Volume resuscitation - Hold home HCTZ, lisinopril  ENDOCRINE A:   DM   P:   - SSI - Hold home metformin, januvia   NEUROLOGIC A:   Depression  Anxiety   Fibromyalgia  P:   - Continue wellbutrin, celexa, lamictal  HEMATOLOGIC  A:  Anemia   P: - Continue oral iron  PULMONARY A: At risk atelectasis   P:   - Pulmonary hygiene  I have personally obtained a history, examined the patient, evaluated laboratory and imaging results, formulated the assessment and plan and placed orders.  CRITICAL CARE: The patient is critically  ill with multiple organ systems failure and requires high complexity decision making for assessment and support, frequent evaluation and titration of therapies, application of advanced monitoring technologies and extensive interpretation of multiple databases. Critical Care Time devoted to patient care services described in this note is 45 minutes.   Alyson Reedy, M.D. Pulmonary and Critical Care Medicine [facility] Pager: [phone]  06/21/2013, 9:16 AM

## 2013-06-21 NOTE — Op Note (Signed)
NAME:  Grace Singh, Grace Singh                   ACCOUNT NO.:  MEDICAL RECORD NO.:  [id number]  LOCATION:                                 FACILITY:  PHYSICIAN:  Sebastian Ache, MD     DATE OF BIRTH:  09-Feb-1966  DATE OF PROCEDURE: 06/20/2013 DATE OF DISCHARGE:                              OPERATIVE REPORT   DIAGNOSIS:  History large volume bilateral renal and ureteral stones with prior urosepsis.  PROCEDURE: 1. Left ureteroscopic stone manipulation with laser lithotripsy. 2. Left retrograde pyelogram interpretation. 3. Exchange of left ureteral stent, 6 x 24, no tether. 4. Right diagnostic ureteroscopy. 5. Right retrograde pyelogram interpretation. 6. Exchange of right ureteral stent, 6 x 24, no tether.  COMPLICATIONS:  None.  SPECIMEN:  Left renal stone fragments for compositional analysis.  ESTIMATED BLOOD LOSS:  Nil.  DRAINS:  None.  FINDINGS: 1. Large volume, left UPJ stone  approximately 10-13 mm in diameter.     This was completely fragmented and removed. 2. No residual right renal stone volume, larger than 1/3 mm. 3. Unremarkable bilateral retrograde pyelograms following stone     manipulation. 4. Unremarkable urinary bladder.  INDICATION:  Grace Singh is a pleasant 47 year old lady with a history of morbid obesity.  She was found on workup of fever and flank pain to have bilateral obstructing ureteral stones and urosepsis.  In late May 2014, she underwent urgent bilateral ureteral stenting with plan for staged approach to her renal stone.  She underwent right ureteroscopic stone manipulation, at which point her entire right side was addressed on June 13, 2013.  She now presents to address her contralateral side. Informed consent was obtained and placed in medical record.  PROCEDURE IN DETAIL:  The patient being Grace Singh, was verified. Procedure being cystoscopy with possible bilateral ureteroscopic stone ablation was confirmed.  Procedure was carried out.   Time-out was performed.  Intravenous antibiotics were administered.  General LMA anesthesia was introduced.  The patient placed into a low lithotomy position.  Sterile field was created by prepping and draping the patient's vagina, introitus, and proximal thighs using iodine x3.  Next, cystourethroscopy was performed using a 22-French rigid cystoscope with 12-degree offset lens.  Inspection of bladder revealed no diverticula, calcifications, papular lesions.  Distal end of ureteral stents were noted in situ and were not encrusted bilaterally.  Attention was then directed at the left side.  The left ureteral stent was grasped and brought out in its entirety and set aside for discard in the left ureteral orifice.  This was cannulated with a 6-French end-hole catheter and left retrograde pyelogram was obtained.  Left retrograde pyelogram demonstrated a single left ureter, single system left kidney.  There was a large filling defect in the area of the UPJ consistent with known stone.  A 0.038 Glidewire was advanced at the level of the upper pole and set aside as a safety wire.  Next, semi- rigid ureteroscopy was performed of the entire length of left ureter. Using a semi-rigid ureteroscope alongside a separate Sensor working wire with normal saline irrigation under pressure, and an 8-French feeding tube in the urinary bladder for pressure release.  This  revealed no mucosal abnormalities or calcifications.  The semi-rigid ureteroscope was exchanged for a 24-cm 12/14 ureteral access sheath over the Sensor working wire under fluoroscopic vision.  Next, the semi-rigid ureteroscope was exchanged.  For the 8-French flexible digital ureteroscope, which allowed inspection of the left proximal ureter and entire left collecting system.  This did reveal a single large solitary stone.  The area of the UPJ consistent with previous known stone, this appeared to be much too large for basketing as such  holmium laser energy was applied the stone using settings of 0.6 joules and 10 Hz fragmenting the stone into pieces of 2-3 mm or less in diameter.  An escape type basket was used to grasp these fragments and removed them sequentially in their entirety.  This technique was employed approximately an hour and half which resulted in removal of all fragments larger than 1 mm in diameter.  There was a conglomeration of very small residual fragments and a midpole calyx that also was not amenable to simple basketing, it was felt being there for future stone formation, as such 5 mL of autologous blood was injected via the ureteroscope into this calyx. Allowed to coagulate for 5 minutes and the clot was removed sequentially using a basket technique and was removed approximately 75% of these smaller non-basketable stones.  Final inspection revealed excellent hemostasis.  No evidence of perforation and removal of all stones larger than 1/3 mm in diameter.  Finally, finally the left access sheath was removed under continuous ureteroscopic vision and no mucosal abnormalities were found.  A new 6 x 24 double-J stent was then placed over the remaining safety wire using fluoroscopic guidance.  Good proximal and distal curl were noted.  Attention was then directed at the right side.  Just along the right stent was grasped, brought to the level of urethral meatus through which a 0.038 Glidewire was advanced at the level of the upper pole.  The stent was exchanged for a 6-French end- hole catheter and right retrograde pyelogram was obtained.  Right retrograde pyelogram demonstrated a single right ureter,  single system, right kidney.  No filling defects or narrowing noted.  The Glidewire was once again advanced, set aside as a safety wire.  Next, semi-rigid ureteroscopy was performed in the entire length of the right ureter alongside a separate Sensor working wire.  No mucosal abnormalities or  calcifications were noted.  The semi-rigid scope was exchanged for the 12/14 ureteral access sheath, which was placed under fluoroscopic vision to the level of proximal ureter.  Next, flexible digital ureteroscopy was performed of the proximal right ureter and systematic inspection of the entire right kidney.  There was several very small residual fragment that were smaller than 1/3 mm in diameter. These were not amenable to basketing.  Using the kidney, it was entirely inspected x2 and no fragments larger than this were encountered. Hemostasis appeared excellent.  There was no evidence of perforation. It was felt that the right side was clinically stone free as such ureteral access sheath, which was removed under continuous endoscopic vision and a new 6 x 24 stent was placed on the right side using fluoroscopic vision good proximal distal curl were noted.  Bladder was emptied per cystoscope.  The procedure was then terminated.  The patient tolerated the procedure well.  There were no immediate periprocedural complications.  The patient was taken to postanesthesia care unit in stable condition.         ______________________________ Sebastian Ache, MD  TM/MEDQ  D:  06/20/2013  T:  06/21/2013  Job:  [id number]

## 2013-06-21 NOTE — Progress Notes (Deleted)
ANTIBIOTIC CONSULT NOTE - INITIAL  Pharmacy Consult for meropenem Indication: urosepsis  No Known Allergies  Patient Measurements:     Vital Signs: Temp: 101.4 F (38.6 C) (07/17 0802) Temp src: Oral (07/17 0802) BP: 106/49 mmHg (07/17 0805) Pulse Rate: 130 (07/17 0805) Intake/Output from previous day:   Intake/Output from this shift:    Labs:  Recent Labs  06/20/13 0916 06/21/13 0616  WBC  --  20.6*  HGB 11.9* 12.6  PLT  --  289  CREATININE 0.80 0.86   The CrCl is unknown because both a height and weight (above a minimum accepted value) are required for this calculation. No results found for this basename: VANCOTROUGH, VANCOPEAK, VANCORANDOM, GENTTROUGH, GENTPEAK, GENTRANDOM, TOBRATROUGH, TOBRAPEAK, TOBRARND, AMIKACINPEAK, AMIKACINTROU, AMIKACIN,  in the last 72 hours   Microbiology: No results found for this or any previous visit (from the past 720 hour(s)).  Medical History: Past Medical History  Diagnosis Date  . Hypertension   . Anxiety   . Depression   . Fibromyalgia   . Hyperlipidemia   . Seasonal asthma   . Diabetes mellitus, type 2   . CKD (chronic kidney disease), stage III   . Wears glasses   . Arthritis     TAILBONE AREA  . Iron deficiency anemia   . Bilateral hydronephrosis   . History of sepsis     05-11-2013  W/ UROSEPSIS---  RESOLIVED  . Normal cardiac stress test     PER PT 2007  . Atrophic kidney     LEFT  . Renal calculi     bilateral    Medications:  Scheduled:  . acetaminophen  650 mg Oral Once   Infusions:  . sodium chloride 1,000 mL (06/21/13 0807)  . sodium chloride    . vancomycin     Assessment: 47 yo female presents to ER with fever, chills, and nausea. She has had multiple recent procedures for problematic kidney stones with most recent being cystoscopy with retrograde pyelogram, ureteroscopy and stent placement with replacement of bilateral stents yesterday 7/16. Patient was  being treated for klebsiella UTI from  05/11/2013 with Cipro per urology notes. For this admission, Cipro 400mg  IV has been given in ER, Vanc 1g x 1 has been ordered, and to begin Meropenem per pharmacy dosing for urosepsis picture  Estimated CrCl 95 ml/min  Elevated WBC   Goal of Therapy:  eradication of infection  Plan:  1) Meropenem 1g IV q8 2) Vanc 1g ordered as mentioned above - was this wanted to be continued beyond 1 time dose?   Hessie Knows, PharmD, BCPS Pager [phone] 06/21/2013 8:11 AM

## 2013-06-22 LAB — GLUCOSE, CAPILLARY: Glucose-Capillary: 197 mg/dL — ABNORMAL HIGH (ref 70–99)

## 2013-06-22 LAB — URINE CULTURE: Culture: NO GROWTH

## 2013-06-22 LAB — BASIC METABOLIC PANEL
BUN: 10 mg/dL (ref 6–23)
Calcium: 8.6 mg/dL (ref 8.4–10.5)
Creatinine, Ser: 0.91 mg/dL (ref 0.50–1.10)
GFR calc non Af Amer: 73 mL/min — ABNORMAL LOW (ref >90)
Glucose, Bld: 241 mg/dL — ABNORMAL HIGH (ref 70–99)
Sodium: 133 mEq/L — ABNORMAL LOW (ref 135–145)

## 2013-06-22 LAB — CBC
Hemoglobin: 10.1 g/dL — ABNORMAL LOW (ref 12.0–15.0)
MCH: 26.3 pg (ref 26.0–34.0)
MCHC: 31.8 g/dL (ref 30.0–36.0)
MCV: 82.8 fL (ref 78.0–100.0)

## 2013-06-22 MED ORDER — LORAZEPAM 0.5 MG PO TABS: 0.5000 mg | ORAL_TABLET | Freq: Three times a day (TID) | ORAL | Status: DC | PRN

## 2013-06-22 MED ORDER — POTASSIUM CHLORIDE CRYS ER 20 MEQ PO TBCR
40.0000 meq | EXTENDED_RELEASE_TABLET | Freq: Once | ORAL | Status: AC
  Administered 2013-06-22: 40 meq via ORAL
  Filled 2013-06-22: qty 2

## 2013-06-22 MED ORDER — PROMETHAZINE HCL 25 MG PO TABS: 25.0000 mg | ORAL_TABLET | Freq: Four times a day (QID) | ORAL | Status: DC | PRN

## 2013-06-22 MED ORDER — METOCLOPRAMIDE HCL 5 MG/ML IJ SOLN
10.0000 mg | Freq: Once | INTRAMUSCULAR | Status: AC
  Administered 2013-06-22: 10 mg via INTRAVENOUS
  Filled 2013-06-22: qty 2

## 2013-06-22 MED ORDER — PHENAZOPYRIDINE HCL 200 MG PO TABS
200.0000 mg | ORAL_TABLET | Freq: Three times a day (TID) | ORAL | Status: DC | PRN
  Filled 2013-06-22: qty 1

## 2013-06-22 MED ORDER — OXYCODONE-ACETAMINOPHEN 5-325 MG PO TABS
1.5000 | ORAL_TABLET | Freq: Four times a day (QID) | ORAL | Status: DC | PRN
  Administered 2013-06-22 – 2013-06-23 (×4): 1.5 via ORAL
  Filled 2013-06-22 (×4): qty 2

## 2013-06-22 MED ORDER — OXYCODONE-ACETAMINOPHEN 7.5-325 MG PO TABS: 1.0000 | ORAL_TABLET | Freq: Four times a day (QID) | ORAL | Status: DC | PRN

## 2013-06-22 NOTE — Progress Notes (Signed)
CRITICAL VALUE ALERT  Critical value received:  Positive blood cultures Aerobic gram negative rods.  Date of notification:  06/22/13  Time of notification:  0917 am  Critical value read back:  yes  Nurse who received alert:  Roney Jaffe, RN  MD notified (1st page): Canary Brim, NP  Time of first page:  NP on the unit  MD notified (2nd page):  Time of second page:  Responding MD:  Canary Brim, NP  Time MD responded:  [phone]

## 2013-06-22 NOTE — Progress Notes (Signed)
Patient refused foley catheter at this time.  Will continue to monitor.

## 2013-06-22 NOTE — Progress Notes (Signed)
Pt states she does not want to sign up for My Chart. Briscoe Burns BSN, RN-BC Admissions RN  06/22/2013 10:05 AM

## 2013-06-22 NOTE — Progress Notes (Signed)
Inpatient Diabetes Program Recommendations  AACE/ADA: New Consensus Statement on Inpatient Glycemic Control (2013)  Target Ranges:  Prepandial:   less than 140 mg/dL      Peak postprandial:   less than 180 mg/dL (1-2 hours)      Critically ill patients:  140 - 180 mg/dL   Reason for Visit: Hyperglycemia  Results for Grace Singh, Grace Singh (MRN [id number]) as of 06/22/2013 13:47  Ref. Range 06/20/2013 12:58 06/21/2013 10:42 06/21/2013 11:47 06/21/2013 17:29 06/21/2013 21:07 06/22/2013 07:28  Glucose-Capillary Latest Range: 70-99 mg/dL 409 (H) 811 (H) 914 (H) 188 (H) 159 (H) 197 (H)   Results for Grace Singh, Grace Singh (MRN [id number]) as of 06/22/2013 13:47  Ref. Range 05/11/2013 12:14  Hemoglobin A1C Latest Range: <5.7 % 6.6 (H)   Blood sugars appear to be well-controlled in the OP setting.  Inpatient Diabetes Program Recommendations Insulin - Basal: If FBS >180mg /dL, consider addition of Lantus 10 units QHS Novolog 3 units tidwc added this am for meal coverage insulin  Will follow.  Thank you. Ailene Ards, RD, LDN, CDE Inpatient Diabetes Coordinator [phone]

## 2013-06-22 NOTE — Progress Notes (Signed)
Subjective:  1 - Urosepsis - Pt with episode urosepsis 05/11/2013 with bilateral obstructing stones prompting ICU admission after emergent bilateral ureteral stenting. Micro from that time Klebsiella. She was treated with IV bridged to PO ABX and had most recently negative urine culture at [facility] office. Therefore underwent staged ureteroscopic approach to her stones s/p last stage yesterday as outpatient having received CX-specific perioperative ABX. Pt then developed malaise, chills, fever to 103 at home about 12 hours after discharge from hospital, called [facility] and told to report to ER where she was found to be tachycardic to 140's, hypotensive, and with leukocytosis to 20k worrisome for recurrent urosepsis. UA / micro from ER with pyuria w/o organisms, UCX, BCX now pending.   2 - Bilateral UPJ Stones - s/p bilat stents 05/11/13, then first stage ureteroscopic stone manipulation 7/9 and second (final) stage 7/16 with ureteral stent exchange. now clinically stone free.   Today Kerryn is feeling a little stronger, but still with spiking fevers and very bothered by urinary frequncy Q15 min or so leading to poor sleep.    Objective: Vital signs in last 24 hours: Temp:  [98.7 F (37.1 C)-103.1 F (39.5 C)] 100.5 F (38.1 C) (07/18 0300) Pulse Rate:  [101-130] 101 (07/18 0522) Resp:  [14-28] 25 (07/18 0522) BP: (91-163)/(46-86) 151/82 mmHg (07/18 0522) SpO2:  [95 %-99 %] 96 % (07/18 0522) Weight:  [121.3 kg (267 lb 6.7 oz)-125.7 kg (277 lb 1.9 oz)] 125.7 kg (277 lb 1.9 oz) (07/18 0500)    Intake/Output from previous day: 07/17 0701 - 07/18 0700 In: 2225 [I.V.:1925; IV Piggyback:300] Out: 3050 [Urine:3050] Intake/Output this shift:    General appearance: alert, cooperative and appears stated age Head: Normocephalic, without obvious abnormality, atraumatic Eyes: conjunctivae/corneas clear. PERRL, EOM's intact. Fundi benign. Ears: normal TM's and external ear canals both  ears Nose: Nares normal. Septum midline. Mucosa normal. No drainage or sinus tenderness. Throat: lips, mucosa, and tongue normal; teeth and gums normal Neck: no adenopathy, no carotid bruit, no JVD, supple, symmetrical, trachea midline and thyroid not enlarged, symmetric, no tenderness/mass/nodules Back: symmetric, no curvature. ROM normal. No CVA tenderness. Resp: clear to auscultation bilaterally Chest wall: no tenderness Breasts: normal appearance, no masses or tenderness Cardio: tachycardia with strong pulse radially. GI: soft, non-tender; bowel sounds normal; no masses,  no organomegaly Pulses: 2+ and symmetric Skin: Skin color, texture, turgor normal. No rashes or lesions Lymph nodes: Cervical, supraclavicular, and axillary nodes normal. Neurologic: Grossly normal Incision/Wound:  Lab Results:   Recent Labs  06/21/13 0616 06/22/13 0342  WBC 20.6* 16.7*  HGB 12.6 10.1*  HCT 38.5 31.8*  PLT 289 281   BMET  Recent Labs  06/21/13 0616 06/22/13 0342  NA 134* 133*  K 3.5 3.2*  CL 94* 97  CO2 21 24  GLUCOSE 137* 241*  BUN 14 10  CREATININE 0.86 0.91  CALCIUM 9.6 8.6   PT/INR No results found for this basename: LABPROT, INR,  in the last 72 hours ABG No results found for this basename: PHART, PCO2, PO2, HCO3,  in the last 72 hours  Studies/Results: Dg Abd 1 View  06/21/2013   *RADIOLOGY REPORT*  Clinical Data: Evaluate stent placement  ABDOMEN - 1 VIEW  Comparison: 06/19/2013  Findings: Cholecystectomy clips are noted in the right upper quadrant of the abdomen.  There are bilateral Nephro ureteral stents in place.  The stents appear to be in satisfactory position. No ureteral calculi identified.  IMPRESSION:  1.  Satisfactory position  of bilateral Nephro ureteral stents.   Original Report Authenticated By: Signa Kell, M.D.   Dg Retrograde Pyelogram  06/20/2013   *RADIOLOGY REPORT*  Clinical data:   Bilateral renal calculi.  The  INTRAOPERATIVE BILATERAL RETROGRADE  UROGRAPHY  Comparison:  06/13/2013 intraoperative images as well as prior CT on 05/11/2013.  Technique:  Images were obtained with the C-arm fluoroscopic device intraoperatively and submitted for interpretation post-operatively. Please see the procedural report for the amount of contrast and the fluoroscopy time utilized.  Findings:  The left ureter was cannulated.  Contrast injection shows a normally patent ureter.  There is suggestion of filling defects centrally in the renal pelvis as well as a potential filling defect in the lower pole.  A ureteral stent was placed which shows appropriate positioning.  Right-sided contrast injection and shows opacification of the renal collecting system without significant filling defect identified.  A ureteral stent was placed which shows good positioning.  IMPRESSION: Bilateral ureteral stent placement.  Contrast injection shows central filling defect at the level of left renal pelvis as well as potentially filling defect in the lower pole collecting system of the left kidney.   Original Report Authenticated By: Irish Lack, M.D.    Anti-infectives: Anti-infectives   Start     Dose/Rate Route Frequency Ordered Stop   06/21/13 2000  vancomycin (VANCOCIN) IVPB 1000 mg/200 mL premix  Status:  Discontinued     1,000 mg 200 mL/hr over 60 Minutes Intravenous Every 12 hours 06/21/13 1030 06/21/13 1031   06/21/13 1400  meropenem (MERREM) 1 g in sodium chloride 0.9 % 100 mL IVPB     1 g 200 mL/hr over 30 Minutes Intravenous 3 times per day 06/21/13 0812     06/21/13 1400  meropenem (MERREM) 1 g in sodium chloride 0.9 % 100 mL IVPB  Status:  Discontinued     1 g 200 mL/hr over 30 Minutes Intravenous 3 times per day 06/21/13 1014 06/21/13 1027   06/21/13 1100  vancomycin (VANCOCIN) IVPB 1000 mg/200 mL premix  Status:  Discontinued     1,000 mg 200 mL/hr over 60 Minutes Intravenous  Once 06/21/13 1030 06/21/13 1031   06/21/13 1000  fluconazole (DIFLUCAN) tablet 150 mg      150 mg Oral Daily 06/21/13 0928     06/21/13 0830  vancomycin (VANCOCIN) IVPB 1000 mg/200 mL premix     1,000 mg 200 mL/hr over 60 Minutes Intravenous  Once 06/21/13 0745 06/21/13 1025   06/21/13 0815  meropenem (MERREM) 1 g in sodium chloride 0.9 % 100 mL IVPB     1 g 200 mL/hr over 30 Minutes Intravenous To Emergency Dept 06/21/13 0812 06/21/13 1026   06/21/13 0800  cefTRIAXone (ROCEPHIN) 1 g in dextrose 5 % 50 mL IVPB  Status:  Discontinued     1 g 100 mL/hr over 30 Minutes Intravenous  Once 06/21/13 0745 06/21/13 0754   06/21/13 0630  ciprofloxacin (CIPRO) IVPB 400 mg     400 mg 200 mL/hr over 60 Minutes Intravenous  Once 06/21/13 0622 06/21/13 0832      Assessment/Plan:  1 - Urosepsis - Agree with current emperic ABX, CX pending. Will place foley today for comfort and help maximally promote GU drainage. Explained sawtooth fever curve common in this setting.   2 - Bilateral UPJ Stones - Now stone free, current stents to remain until completely afebrile and back to baseline.   [facility], Mandi Mattioli 06/22/2013

## 2013-06-22 NOTE — Progress Notes (Addendum)
PULMONARY  / CRITICAL CARE MEDICINE  Name: Grace Singh MRN: [id number] DOB: 1966-01-31    ADMISSION DATE:  06/21/2013  REFERRING MD :  Dr. Patria Mane PRIMARY SERVICE: PCCM  CHIEF COMPLAINT:  Urosepsis  BRIEF PATIENT DESCRIPTION: 47 y/o F with PMH of recurrent renal calculi s/p stenting on 7/17.  Presented to [facility] ER 7/17 with onset of fevers / chills post stenting.  Found to be hypotensive with lactate of ~4.   SIGNIFICANT EVENTS / STUDIES:  6/06 - Emergent cysto & bilateral stent placement for UPK stones (12mm ea).  Rx with Cipro for Klebsiella UTI 7/16 - Cystoscopy with removal of bilateral stents and replacement, laser of R stone.  D/C'd on Bactrim PO.   7/18 - continued fevers, GNR in blood  LINES / TUBES:   CULTURES: UC 7/17>>> BCx2 7/17>>>GNR>>>  ANTIBIOTICS: Meropenem 7/17>>> Diflucan 7/17>>> Vanco 7/17>>>7/17 Cipro 7/17>>>7/17   SUBJECTIVE: "didnt sleep last night from going to bathroom"  VITAL SIGNS: Temp:  [98.7 F (37.1 C)-103.1 F (39.5 C)] 103 F (39.4 C) (07/18 0800) Pulse Rate:  [101-123] 110 (07/18 0750) Resp:  [14-28] 24 (07/18 0750) BP: (91-163)/(54-86) 151/82 mmHg (07/18 0522) SpO2:  [95 %-99 %] 98 % (07/18 0750) Weight:  [267 lb 6.7 oz (121.3 kg)-277 lb 1.9 oz (125.7 kg)] 277 lb 1.9 oz (125.7 kg) (07/18 0500)  INTAKE / OUTPUT: Intake/Output     07/17 0701 - 07/18 0700 07/18 0701 - 07/19 0700   I.V. (mL/kg) 2025 (16.1)    IV Piggyback 300    Total Intake(mL/kg) 2325 (18.5)    Urine (mL/kg/hr) 3050 (1) 700 (2.5)   Total Output 3050 700   Net -725 -700        Stool Occurrence 1 x 1 x     PHYSICAL EXAMINATION: General:  wdwn adult female in NAD Neuro:  AAOx4, speech clear, MAE HEENT:  Mm pink/moist, good dentition, face flushed / sweating Cardiovascular:  s1s2 rrr, no m/r/g, tachy Lungs:  resp's even/non-labored, lungs bilaterally distant but clear Abdomen:  Obese / soft, bsx4 active Musculoskeletal:  No acute deformities, ambulates  without difficulty  Skin: moist, warm, no edema  LABS:  Recent Labs Lab 06/20/13 0916 06/21/13 0616 06/21/13 1024 06/22/13 0342  HGB 11.9* 12.6  --  10.1*  WBC  --  20.6*  --  16.7*  PLT  --  289  --  281  NA 135 134*  --  133*  K 4.0 3.5  --  3.2*  CL 105 94*  --  97  CO2  --  21  --  24  GLUCOSE 132* 137*  --  241*  BUN 12 14  --  10  CREATININE 0.80 0.86  --  0.91  CALCIUM  --  9.6  --  8.6  LATICACIDVEN  --  4.3* 3.83*  --     Recent Labs Lab 06/21/13 1042 06/21/13 1147 06/21/13 1729 06/21/13 2107 06/22/13 0728  GLUCAP 181* 224* 188* 159* 197*    XRay:  7/17 KUB >> satisfactory position of bilateral nephro ureteral stents  ASSESSMENT / PLAN:  RENAL A:   Bilateral Renal Calculi s/p Stenting 7/17 - hx of emergent stent in 05/2013 with urosepsis, and repeat cysto 7/17 with bilateral replacement of stents and laser of R stone Atrophy of Left Kidney  P:   -Stent placement verified with KUB -Dr. Berneice Heinrich following, appreciate input -See ID section  INFECTIOUS A:   Sepsis - urinary source, hx of klebsiella UTI 05/11/13 (  sens rocephin, cipro, zosyn).  Has been treated with Cipro & Bactrim in past month. UTI Leukocytosis / Fever  P:   - Continue meropenem.  Will need 10-14 days therapy.  Had taken only one dose of bactrim prior to this admit.  Pending cultures, could tx back to bactrim  - NS at 100 ml /hr - Follow UC, BC  CARDIOVASCULAR A:  Sinus Tachycardia HTN P:  - Tele monitor  - Volume resuscitation - Hold home HCTZ, lisinopril  ENDOCRINE A:   DM   P:   - SSI - Hold home metformin, januvia   NEUROLOGIC A:   Depression  Anxiety  Fibromyalgia  P:   - Continue wellbutrin, celexa, lamictal - Resume PRN Percocet, caution with tylenol dosing   HEMATOLOGIC  A:  Anemia   P: - Continue oral iron  PULMONARY A: At risk atelectasis   P:   - Pulmonary hygiene  Canary Brim, NP-C [facility] Pgr: [phone] or  [phone]  I have personally obtained a history, examined the patient, evaluated laboratory and imaging results, formulated the assessment and plan and placed orders.  Will hold in SDU overnight given frequent fever and fluctuant BP changes, if remains stable can likely go to tele in AM and TRH.  06/22/2013, 9:16 AM

## 2013-06-22 NOTE — Anesthesia Postprocedure Evaluation (Signed)
Anesthesia Post Note  Patient: Grace Singh  Procedure(s) Performed: Procedure(s) (LRB): CYSTOSCOPY WITH RETROGRADE PYELOGRAM, URETEROSCOPY AND STENT PLACEMENT, REMOVAL BILATERAL STENTS (Bilateral) HOLMIUM LASER APPLICATION (Right)  Anesthesia type: General  Patient location: PACU  Post pain: Pain level controlled  Post assessment: Post-op Vital signs reviewed  Last Vitals:  Filed Vitals:   06/21/13 2337  BP: 158/80  Pulse: 103  Temp: 38.2 C  Resp: 25    Post vital signs: Reviewed  Level of consciousness: sedated  Complications: No apparent anesthesia complications

## 2013-06-22 NOTE — Progress Notes (Signed)
eLink Physician Progress Note and Electrolyte Replacement  Patient Name: Grace Singh DOB: 1966-11-18 MRN: [id number]  Date of Service  06/22/2013   HPI/Events of Note    Recent Labs Lab 06/20/13 0916 06/21/13 0616 06/22/13 0342  NA 135 134* 133*  K 4.0 3.5 3.2*  CL 105 94* 97  CO2  --  21 24  GLUCOSE 132* 137* 241*  BUN 12 14 10   CREATININE 0.80 0.86 0.91  CALCIUM  --  9.6 8.6    Estimated Creatinine Clearance: 101.4 ml/min (by C-G formula based on Cr of 0.91).  Intake/Output     07/17 0701 - 07/18 0700   I.V. (mL/kg) 1825 (14.5)   IV Piggyback 300   Total Intake(mL/kg) 2125 (16.9)   Urine (mL/kg/hr) 3050 (1)   Total Output 3050   Net -925        - I/O DETAILED x 24h    Total I/O In: 1200 [I.V.:1000; IV Piggyback:200] Out: 1850 [Urine:1850] - I/O THIS SHIFT    ASSESSMENT Low K  eICURN Interventions  Kcl 40 x 1 x po   ASSESSMENT: MAJOR ELECTROLYTE      Dr. Kalman Shan, M.D., [facility].C.P Pulmonary and Critical Care Medicine Staff Physician [facility] [place] [facility] Pager: [phone], If no answer or between  15:00h - 7:00h: call [phone]  06/22/2013 5:30 AM

## 2013-06-22 NOTE — Progress Notes (Signed)
eLink Physician-Brief Progress Note Patient Name: Grace Singh DOB: 09/27/1966 MRN: [id number]  Date of Service  06/22/2013   HPI/Events of Note  nausea   eICU Interventions  rglan 10mg  IV x 1      [name] 06/22/2013, 1:37 AM

## 2013-06-22 NOTE — Plan of Care (Signed)
Problem: Phase II Progression Outcomes Goal: Tolerating diet Outcome: Progressing Poor appetite.

## 2013-06-23 DIAGNOSIS — R7881 Bacteremia: Secondary | ICD-10-CM

## 2013-06-23 DIAGNOSIS — R809 Proteinuria, unspecified: Secondary | ICD-10-CM | POA: Diagnosis present

## 2013-06-23 DIAGNOSIS — N200 Calculus of kidney: Secondary | ICD-10-CM

## 2013-06-23 DIAGNOSIS — IMO0002 Reserved for concepts with insufficient information to code with codable children: Secondary | ICD-10-CM | POA: Diagnosis present

## 2013-06-23 DIAGNOSIS — B9689 Other specified bacterial agents as the cause of diseases classified elsewhere: Secondary | ICD-10-CM

## 2013-06-23 LAB — BASIC METABOLIC PANEL
BUN: 8 mg/dL (ref 6–23)
CO2: 27 mEq/L (ref 19–32)
Calcium: 8.8 mg/dL (ref 8.4–10.5)
Chloride: 104 mEq/L (ref 96–112)
Creatinine, Ser: 0.83 mg/dL (ref 0.50–1.10)
Glucose, Bld: 132 mg/dL — ABNORMAL HIGH (ref 70–99)

## 2013-06-23 LAB — CBC
HCT: 29.7 % — ABNORMAL LOW (ref 36.0–46.0)
MCH: 26.6 pg (ref 26.0–34.0)
MCV: 83.9 fL (ref 78.0–100.0)
Platelets: 249 10*3/uL (ref 150–400)
RDW: 16.8 % — ABNORMAL HIGH (ref 11.5–15.5)

## 2013-06-23 LAB — GLUCOSE, CAPILLARY: Glucose-Capillary: 178 mg/dL — ABNORMAL HIGH (ref 70–99)

## 2013-06-23 MED ORDER — LEVOFLOXACIN 750 MG PO TABS: 750.0000 mg | ORAL_TABLET | Freq: Every day | ORAL | Status: DC

## 2013-06-23 NOTE — Plan of Care (Signed)
Problem: Phase I Progression Outcomes Goal: Tubes/drains patent Outcome: Completed/Met Date Met:  06/23/13 Kidney stents.  Problem: Phase II Progression Outcomes Goal: Progressing with IS, TCDB Outcome: Completed/Met Date Met:  06/23/13 Patient is has incentive spirometry at home.  Does not want another.  Patient is TCDB when prompted.

## 2013-06-23 NOTE — Discharge Summary (Signed)
Physician Discharge Summary     Patient ID: Grace Singh MRN: [id number] DOB/AGE: 49-05-1966 49 y.o.  Admit date: 06/21/2013 Discharge date: 06/23/2013  Discharge Diagnoses:  Principal Problem:   Septic shock Active Problems:   Acute pyelonephritis   Nephrolithiasis   DM2 (diabetes mellitus, type 2)   Bacteremia due to Gram-negative bacteria     Discharge Plan by diagnoses  1. Septic shock due to UTI due to renal stones s/p bilateral UPK stone stent in ureters 05/2013  with gram neg rod bacteremia.  S/p removal of stents 06/20/13 with sepsis due to stents with cysto.    Discharge Plan: Follow up with urology scheduled.    Levaquin 750mg  daily x 7days Resume all home meds as scheduled.  2. DM2   Plan to resume all home meds for same.  Detailed Hospital Course:  BRIEF PATIENT DESCRIPTION: 47 y/o F with PMH of recurrent renal calculi s/p stenting on 7/17. Presented to [facility] ER 7/17 with onset of fevers / chills post stenting. Found to be hypotensive with lactate of ~4.  SIGNIFICANT EVENTS / STUDIES:  6/06 - Emergent cysto & bilateral stent placement for UPK stones (12mm ea). Rx with Cipro for Klebsiella UTI  7/16 - Cystoscopy with removal of bilateral stents and replacement, laser of R stone. D/C'd on Bactrim PO.  7/18 - continued fevers, GNR in blood  LINES / TUBES:  CULTURES:  UC 7/17>>>  BCx2 7/17>>>GNR>>>  ANTIBIOTICS:  Meropenem 7/17>>> 7/19 Diflucan 7/17>>> 7/19 Vanco 7/17>>>7/17  Cipro 7/17>>>7/17  Levaquin 7/19 PO  Discharge Exam: BP 149/91  Pulse 79  Temp(Src) 98.6 F (37 C) (Oral)  Resp 17  Ht 5\' 6"  (1.676 m)  Wt 125.7 kg (277 lb 1.9 oz)  BMI 44.75 kg/m2  SpO2 97%  LMP 04/27/2013  Pt stable at discharge.   Exam unremarkable  Labs at discharge Lab Results  Component Value Date   CREATININE 0.91 06/22/2013   BUN 10 06/22/2013   NA 133* 06/22/2013   K 3.2* 06/22/2013   CL 97 06/22/2013   CO2 24 06/22/2013   Lab Results  Component Value Date   WBC 16.7* 06/22/2013   HGB 10.1* 06/22/2013   HCT 31.8* 06/22/2013   MCV 82.8 06/22/2013   PLT 281 06/22/2013   Lab Results  Component Value Date   ALT 6 06/02/2009   AST 21 06/02/2009   ALKPHOS 123* 06/02/2009   BILITOT 0.3 06/02/2009   No results found for this basename: INR,  PROTIME    Current radiology studies No results found.  Disposition:  01-Home or Self Care      Discharge Orders   Future Orders Complete By Expires     Discharge patient  As directed         Medication List    STOP taking these medications       sulfamethoxazole-trimethoprim 800-160 MG per tablet  Commonly known as:  BACTRIM DS      TAKE these medications       ALBUTEROL IN  Inhale into the lungs as needed.     atorvastatin 80 MG tablet  Commonly known as:  LIPITOR  Take 40 mg by mouth every morning.     buPROPion 75 MG tablet  Commonly known as:  WELLBUTRIN  Take 75 mg by mouth 2 (two) times daily.     citalopram 40 MG tablet  Commonly known as:  CELEXA  Take 40 mg by mouth daily.     ferrous sulfate 325 (65  FE) MG tablet  Take 325 mg by mouth 2 (two) times daily with a meal.     hydrochlorothiazide 25 MG tablet  Commonly known as:  HYDRODIURIL  Take 12.5 mg by mouth daily.     lamoTRIgine 200 MG tablet  Commonly known as:  LAMICTAL  Take 100-200 mg by mouth 2 (two) times daily. Takes 200 mg in am and 100 mg in pm     levofloxacin 750 MG tablet  Commonly known as:  LEVAQUIN  Take 1 tablet (750 mg total) by mouth daily.     lisinopril 20 MG tablet  Commonly known as:  PRINIVIL,ZESTRIL  Take 20 mg by mouth daily.     LORazepam 0.5 MG tablet  Commonly known as:  ATIVAN  Take 0.5 mg by mouth every 8 (eight) hours as needed for anxiety.     metFORMIN 1000 MG tablet  Commonly known as:  GLUCOPHAGE  Take 1,000 mg by mouth 2 (two) times daily with a meal.     omega-3 acid ethyl esters 1 G capsule  Commonly known as:  LOVAZA  Take 1 g by mouth 2 (two) times daily.      oxyCODONE-acetaminophen 7.5-325 MG per tablet  Commonly known as:  PERCOCET  Take 1 tablet by mouth every 6 (six) hours as needed for pain.     phenazopyridine 200 MG tablet  Commonly known as:  PYRIDIUM  Take 200 mg by mouth 3 (three) times daily as needed for pain.     promethazine 12.5 MG tablet  Commonly known as:  PHENERGAN  Take 2 tablets (25 mg total) by mouth every 6 (six) hours as needed for nausea.     sennosides-docusate sodium 8.6-50 MG tablet  Commonly known as:  SENOKOT-S  Take 1 tablet by mouth 2 (two) times daily. While taking pain meds to prevent constipation.     sitaGLIPtin 50 MG tablet  Commonly known as:  JANUVIA  Take 50 mg by mouth daily.     Vitamin D (Ergocalciferol) 50000 UNITS Caps  Commonly known as:  DRISDOL  Take 50,000 Units by mouth every 7 (seven) days. Sunday       Follow-up Information   Follow up with Sebastian Ache, MD. (keep scheduled appointment)    Contact information:   [address] [place] [address] [phone]       Discharged Condition: good  Physician Statement:   The Patient was personally examined, the discharge assessment and plan has been personally reviewed and I agree with ACNP Babcock's assessment and plan. > 30 minutes of time have been dedicated to discharge assessment, planning and discharge instructions.   SignedDorcas Carrow 06/23/2013, 10:36 AM

## 2013-06-23 NOTE — Progress Notes (Signed)
Subjective: Patient reports no flank pain, nausea or subjective fever. She said overall she feels well.  Objective: Vital signs in last 24 hours: Temp:  [97.8 F (36.6 C)-103 F (39.4 C)] 98.1 F (36.7 C) (07/19 0400) Pulse Rate:  [72-106] 89 (07/19 0725) Resp:  [15-25] 16 (07/19 0725) BP: (134-180)/(76-92) 178/92 mmHg (07/19 0725) SpO2:  [94 %-99 %] 97 % (07/19 0725)  Intake/Output from previous day: 07/18 0701 - 07/19 0700 In: 3244 [P.O.:644; I.V.:2300; IV Piggyback:300] Out: 5550 [Urine:5550] Intake/Output this shift:    Physical Exam:  General:alert, cooperative and no distress GI: Her abdomen is soft and nontender. She has no CVAT.  Lab Results:  Recent Labs  06/20/13 0916 06/21/13 0616 06/22/13 0342  HGB 11.9* 12.6 10.1*  HCT 35.0* 38.5 31.8*   BMET  Recent Labs  06/21/13 0616 06/22/13 0342  NA 134* 133*  K 3.5 3.2*  CL 94* 97  CO2 21 24  GLUCOSE 137* 241*  BUN 14 10  CREATININE 0.86 0.91  CALCIUM 9.6 8.6   No results found for this basename: LABPT, INR,  in the last 72 hours No results found for this basename: LABURIN,  in the last 72 hours Results for orders placed during the hospital encounter of 06/21/13  CULTURE, BLOOD (ROUTINE X 2)     Status: None   Collection Time    06/21/13  6:16 AM      Result Value Range Status   Specimen Description BLOOD LEFT ANTECUBITAL   Final   Special Requests BOTTLES DRAWN AEROBIC AND ANAEROBIC 10CC   Final   Culture  Setup Time 06/21/2013 09:32   Final   Culture     Final   Value: GRAM NEGATIVE RODS     Note: Gram Stain Report Called to,Read Back By and Verified With: ANGELA ASHLEY@0917  ON [id number] BY [facility]   Report Status PENDING   Incomplete  CULTURE, BLOOD (ROUTINE X 2)     Status: None   Collection Time    06/21/13  6:38 AM      Result Value Range Status   Specimen Description BLOOD LEFT HAND   Final   Special Requests BOTTLES DRAWN AEROBIC AND ANAEROBIC   Final   Culture  Setup Time 06/21/2013  09:32   Final   Culture     Final   Value:        BLOOD CULTURE RECEIVED NO GROWTH TO DATE CULTURE WILL BE HELD FOR 5 DAYS BEFORE ISSUING A FINAL NEGATIVE REPORT   Report Status PENDING   Incomplete  URINE CULTURE     Status: None   Collection Time    06/21/13  7:37 AM      Result Value Range Status   Specimen Description URINE, CATHETERIZED   Final   Special Requests NONE   Final   Culture  Setup Time 06/21/2013 12:17   Final   Colony Count NO GROWTH   Final   Culture NO GROWTH   Final   Report Status 06/22/2013 FINAL   Final  MRSA PCR SCREENING     Status: None   Collection Time    06/21/13 11:17 AM      Result Value Range Status   MRSA by PCR NEGATIVE  NEGATIVE Final   Comment:            The GeneXpert MRSA Assay (FDA     approved for NASAL specimens     only), is one component of a  comprehensive MRSA colonization     surveillance program. It is not     intended to diagnose MRSA     infection nor to guide or     monitor treatment for     MRSA infections.    Studies/Results: Dg Abd 1 View  06/21/2013   *RADIOLOGY REPORT*  Clinical Data: Evaluate stent placement  ABDOMEN - 1 VIEW  Comparison: 06/19/2013  Findings: Cholecystectomy clips are noted in the right upper quadrant of the abdomen.  There are bilateral Nephro ureteral stents in place.  The stents appear to be in satisfactory position. No ureteral calculi identified.  IMPRESSION:  1.  Satisfactory position of bilateral Nephro ureteral stents.   Original Report Authenticated By: Signa Kell, M.D.    Assessment/Plan: Although she was admitted with high fever and her blood cultures remained incomplete there has been no growth seen on her blood cultures so far. Her preop urine culture was negative and her procedure was performed with intravenous antibiotics. Her most recent urine culture, done on her admission also has shown no growth. She is feeling well and has defervesced with no fever in 24 hours. It would appear she  is ready for discharge from a urologic standpoint.  She should be discharged home on oral antibiotics with followup as previously scheduled.   LOS: 2 days   Javarus Dorner C 06/23/2013, 7:53 AM

## 2013-06-23 NOTE — Progress Notes (Signed)
Patient to discharge to home with spouse.  Pre-existing follow-up appointment with Dr. Berneice Heinrich on 06-28-2013.  Patient and spouse education reinforced concerning signs and symptoms of infection.

## 2013-06-24 LAB — CULTURE, BLOOD (ROUTINE X 2)

## 2013-06-25 NOTE — ED Provider Notes (Signed)
Medical screening examination/treatment/procedure(s) were conducted as a shared visit with non-physician practitioner(s) and myself.  I personally evaluated the patient during the encounter  CRITICAL CARE Performed by: Lyanne Co Total critical care time: 35 Critical care time was exclusive of separately billable procedures and treating other patients. Critical care was necessary to treat or prevent imminent or life-threatening deterioration. Critical care was time spent personally by me on the following activities: development of treatment plan with patient and/or surrogate as well as nursing, discussions with consultants, evaluation of patient's response to treatment, examination of patient, obtaining history from patient or surrogate, ordering and performing treatments and interventions, ordering and review of laboratory studies, ordering and review of radiographic studies, pulse oximetry and re-evaluation of patient's condition.   Sepsis with concern for rapidly progressing symptoms and heading towards septic shock. Abx,. Critical care to evaluate. 3 liter bolus in ER as resuscitation begun. Followed throughout ER stay. Abx, cultures  Lyanne Co, MD 06/25/13 [phone]

## 2013-06-27 LAB — CULTURE, BLOOD (ROUTINE X 2)

## 2013-07-01 ENCOUNTER — Encounter (HOSPITAL_COMMUNITY): Payer: Self-pay | Admitting: *Deleted

## 2013-07-01 ENCOUNTER — Emergency Department (HOSPITAL_COMMUNITY): Payer: Medicare Other

## 2013-07-01 ENCOUNTER — Inpatient Hospital Stay (HOSPITAL_COMMUNITY)
Admission: EM | Admit: 2013-07-01 | Discharge: 2013-07-03 | DRG: 872 | Disposition: A | Discharge status: Home / Self Care | Payer: Medicare Other | Attending: Internal Medicine | Admitting: Internal Medicine

## 2013-07-01 DIAGNOSIS — Z87442 Personal history of urinary calculi: Secondary | ICD-10-CM

## 2013-07-01 DIAGNOSIS — N183 Chronic kidney disease, stage 3 unspecified: Secondary | ICD-10-CM | POA: Diagnosis present

## 2013-07-01 DIAGNOSIS — B3749 Other urogenital candidiasis: Secondary | ICD-10-CM | POA: Diagnosis present

## 2013-07-01 DIAGNOSIS — N10 Acute tubulo-interstitial nephritis: Secondary | ICD-10-CM | POA: Diagnosis present

## 2013-07-01 DIAGNOSIS — M129 Arthropathy, unspecified: Secondary | ICD-10-CM | POA: Diagnosis present

## 2013-07-01 DIAGNOSIS — Z6841 Body Mass Index (BMI) 40.0 and over, adult: Secondary | ICD-10-CM

## 2013-07-01 DIAGNOSIS — E876 Hypokalemia: Secondary | ICD-10-CM | POA: Diagnosis present

## 2013-07-01 DIAGNOSIS — E785 Hyperlipidemia, unspecified: Secondary | ICD-10-CM | POA: Diagnosis present

## 2013-07-01 DIAGNOSIS — N200 Calculus of kidney: Secondary | ICD-10-CM

## 2013-07-01 DIAGNOSIS — N179 Acute kidney failure, unspecified: Secondary | ICD-10-CM | POA: Diagnosis present

## 2013-07-01 DIAGNOSIS — E119 Type 2 diabetes mellitus without complications: Secondary | ICD-10-CM | POA: Diagnosis present

## 2013-07-01 DIAGNOSIS — Z79899 Other long term (current) drug therapy: Secondary | ICD-10-CM

## 2013-07-01 DIAGNOSIS — E1129 Type 2 diabetes mellitus with other diabetic kidney complication: Secondary | ICD-10-CM | POA: Diagnosis present

## 2013-07-01 DIAGNOSIS — D509 Iron deficiency anemia, unspecified: Secondary | ICD-10-CM | POA: Diagnosis present

## 2013-07-01 DIAGNOSIS — IMO0001 Reserved for inherently not codable concepts without codable children: Secondary | ICD-10-CM | POA: Diagnosis present

## 2013-07-01 DIAGNOSIS — N390 Urinary tract infection, site not specified: Secondary | ICD-10-CM | POA: Diagnosis present

## 2013-07-01 DIAGNOSIS — I10 Essential (primary) hypertension: Secondary | ICD-10-CM

## 2013-07-01 DIAGNOSIS — D72829 Elevated white blood cell count, unspecified: Secondary | ICD-10-CM | POA: Diagnosis present

## 2013-07-01 DIAGNOSIS — F411 Generalized anxiety disorder: Secondary | ICD-10-CM | POA: Diagnosis present

## 2013-07-01 DIAGNOSIS — F329 Major depressive disorder, single episode, unspecified: Secondary | ICD-10-CM | POA: Diagnosis present

## 2013-07-01 DIAGNOSIS — R509 Fever, unspecified: Secondary | ICD-10-CM

## 2013-07-01 DIAGNOSIS — A419 Sepsis, unspecified organism: Principal | ICD-10-CM | POA: Diagnosis present

## 2013-07-01 DIAGNOSIS — Z87440 Personal history of urinary (tract) infections: Secondary | ICD-10-CM

## 2013-07-01 DIAGNOSIS — R6520 Severe sepsis without septic shock: Secondary | ICD-10-CM | POA: Diagnosis present

## 2013-07-01 DIAGNOSIS — R6510 Systemic inflammatory response syndrome (SIRS) of non-infectious origin without acute organ dysfunction: Secondary | ICD-10-CM

## 2013-07-01 DIAGNOSIS — I129 Hypertensive chronic kidney disease with stage 1 through stage 4 chronic kidney disease, or unspecified chronic kidney disease: Secondary | ICD-10-CM | POA: Diagnosis present

## 2013-07-01 DIAGNOSIS — F3289 Other specified depressive episodes: Secondary | ICD-10-CM | POA: Diagnosis present

## 2013-07-01 DIAGNOSIS — J45909 Unspecified asthma, uncomplicated: Secondary | ICD-10-CM | POA: Diagnosis present

## 2013-07-01 DIAGNOSIS — IMO0002 Reserved for concepts with insufficient information to code with codable children: Secondary | ICD-10-CM | POA: Diagnosis present

## 2013-07-01 LAB — URINALYSIS, ROUTINE W REFLEX MICROSCOPIC
Glucose, UA: NEGATIVE mg/dL
Ketones, ur: NEGATIVE mg/dL
Nitrite: NEGATIVE
Protein, ur: 30 mg/dL — AB
Urobilinogen, UA: 0.2 mg/dL (ref 0.0–1.0)

## 2013-07-01 LAB — COMPREHENSIVE METABOLIC PANEL
Albumin: 3 g/dL — ABNORMAL LOW (ref 3.5–5.2)
Alkaline Phosphatase: 97 U/L (ref 39–117)
BUN: 23 mg/dL (ref 6–23)
Chloride: 90 mEq/L — ABNORMAL LOW (ref 96–112)
Creatinine, Ser: 1.68 mg/dL — ABNORMAL HIGH (ref 0.50–1.10)
GFR calc Af Amer: 40 mL/min — ABNORMAL LOW (ref >90)
GFR calc non Af Amer: 35 mL/min — ABNORMAL LOW (ref >90)
Glucose, Bld: 165 mg/dL — ABNORMAL HIGH (ref 70–99)
Potassium: 3.1 mEq/L — ABNORMAL LOW (ref 3.5–5.1)
Total Bilirubin: 0.4 mg/dL (ref 0.3–1.2)

## 2013-07-01 LAB — CBC WITH DIFFERENTIAL/PLATELET
Basophils Relative: 0 % (ref 0–1)
HCT: 36.4 % (ref 36.0–46.0)
Hemoglobin: 11.8 g/dL — ABNORMAL LOW (ref 12.0–15.0)
Lymphs Abs: 1.5 10*3/uL (ref 0.7–4.0)
MCH: 26.2 pg (ref 26.0–34.0)
MCHC: 32.4 g/dL (ref 30.0–36.0)
Monocytes Absolute: 0.9 10*3/uL (ref 0.1–1.0)
Monocytes Relative: 5 % (ref 3–12)
Neutro Abs: 15 10*3/uL — ABNORMAL HIGH (ref 1.7–7.7)
Neutrophils Relative %: 85 % — ABNORMAL HIGH (ref 43–77)
RBC: 4.51 MIL/uL (ref 3.87–5.11)

## 2013-07-01 LAB — PROCALCITONIN: Procalcitonin: 0.12 ng/mL

## 2013-07-01 LAB — CG4 I-STAT (LACTIC ACID): Lactic Acid, Venous: 3.44 mmol/L — ABNORMAL HIGH (ref 0.5–2.2)

## 2013-07-01 LAB — LIPASE, BLOOD: Lipase: 17 U/L (ref 11–59)

## 2013-07-01 LAB — URINE MICROSCOPIC-ADD ON

## 2013-07-01 MED ORDER — SODIUM CHLORIDE 0.9 % IV SOLN
1000.0000 mL | Freq: Once | INTRAVENOUS | Status: AC
  Administered 2013-07-01: 1000 mL via INTRAVENOUS

## 2013-07-01 MED ORDER — SODIUM CHLORIDE 0.9 % IV BOLUS (SEPSIS)
1000.0000 mL | Freq: Once | INTRAVENOUS | Status: AC
  Administered 2013-07-01: 1000 mL via INTRAVENOUS

## 2013-07-01 MED ORDER — SODIUM CHLORIDE 0.9 % IV SOLN
1000.0000 mL | INTRAVENOUS | Status: DC
  Administered 2013-07-02 – 2013-07-03 (×4): 1000 mL via INTRAVENOUS

## 2013-07-01 MED ORDER — ONDANSETRON HCL 4 MG/2ML IJ SOLN
4.0000 mg | Freq: Once | INTRAMUSCULAR | Status: AC
  Administered 2013-07-01: 4 mg via INTRAVENOUS
  Filled 2013-07-01: qty 2

## 2013-07-01 NOTE — ED Provider Notes (Signed)
CSN: [id number]     Arrival date & time 07/01/13  2000 History     First MD Initiated Contact with Patient 07/01/13 2034     Chief Complaint  Patient presents with  . Nausea  . Emesis  . Fever   (Consider location/radiation/quality/duration/timing/severity/associated sxs/prior Treatment) The history is provided by the patient and medical records. No language interpreter was used.    Grace Singh is a 47 y.o. female  with a hx of recurrent kidney stones with lithotripsy and stenting presents to the Emergency Department complaining of gradual, persistent, progressively worsening nausea, vomiting, fever beginning yesterday morning.  Pt with bilateral ureteral stinting on 05/11/13 pending further procedures to relieve hydronephrosis secondary to bilateral large kidney stones. 06/13/13 the right stent was replaced on 06/20/16 the left stent was replaced. She has undergone lithotripsy and basket retrieval as well. On  Thursday 06/28/13 both stints were removed.  Pt was having nausea and vomiting the night before removal.  She has intermittent nausea and vomiting on Thursday, felt better on Friday but nausea and vomiting with associated fever began Saturday morning.  She woke early this morning with a fever and chills which is a similar presentation to previous recent uroseptic illness.  Nothing makes symptoms better or worse.  Patient denies neck pain, chest pain, shortness of breath, diarrhea, dizziness, syncope.    Past Medical History  Diagnosis Date  . Hypertension   . Anxiety   . Depression   . Fibromyalgia   . Hyperlipidemia   . Seasonal asthma   . Diabetes mellitus, type 2   . CKD (chronic kidney disease), stage III   . Wears glasses   . Arthritis     TAILBONE AREA  . Iron deficiency anemia   . Bilateral hydronephrosis   . History of sepsis     05-11-2013  W/ UROSEPSIS---  RESOLIVED  . Normal cardiac stress test     PER PT 2007  . Atrophic kidney     LEFT  . Renal calculi    bilateral   Past Surgical History  Procedure Laterality Date  . Coccyx removal  2009  . Hysteroscopy w/d&c N/A 04/20/2013    Procedure: DILATATION AND CURETTAGE /HYSTEROSCOPY;  Surgeon: Zelphia Cairo, MD;  Location: [facility];  Service: Gynecology;  Laterality: N/A;  . Polypectomy N/A 04/20/2013    Procedure: POLYPECTOMY;  Surgeon: Zelphia Cairo, MD;  Location: [facility];  Service: Gynecology;  Laterality: N/A;  . Cystoscopy with stent placement Bilateral 05/11/2013    Procedure: CYSTOSCOPY WITH STENT PLACEMENT;  Surgeon: Sebastian Ache, MD;  Location: [facility];  Service: Urology;  Laterality: Bilateral;  . Laparoscopic cholecystectomy  AGE 47  . Appendectomy  AGE 27  . Cystoscopy with retrograde pyelogram, ureteroscopy and stent placement Right 06/13/2013    Procedure: First Stage Bilateral Ureteroscopy;  Surgeon: Sebastian Ache, MD;  Location: [facility];  Service: Urology;  Laterality: Right;  . Holmium laser application Right 06/13/2013    Procedure: HOLMIUM LASER APPLICATION, right ureteral stone;  Surgeon: Sebastian Ache, MD;  Location: [facility];  Service: Urology;  Laterality: Right;  . Cystoscopy w/ ureteral stent placement Right 06/13/2013    Procedure: CYSTOSCOPY WITH STENT REPLACEMENT, right;  Surgeon: Sebastian Ache, MD;  Location: [facility];  Service: Urology;  Laterality: Right;  . Cystoscopy with retrograde pyelogram, ureteroscopy and stent placement Bilateral 06/20/2013    Procedure: CYSTOSCOPY WITH RETROGRADE PYELOGRAM, URETEROSCOPY AND STENT PLACEMENT, REMOVAL BILATERAL STENTS;  Surgeon: Normand Sloop  Berneice Heinrich, MD;  Location: [facility];  Service: Urology;  Laterality: Bilateral;  Bilateral stent repalcement with not string  . Holmium laser application Right 06/20/2013    Procedure: HOLMIUM LASER APPLICATION;  Surgeon: Sebastian Ache, MD;  Location: [facility];  Service: Urology;  Laterality: Right;   No family  history on file. History  Substance Use Topics  . Smoking status: Never Smoker   . Smokeless tobacco: Never Used  . Alcohol Use: No   OB History   Grav Para Term Preterm Abortions TAB SAB Ect Mult Living                 Review of Systems  Constitutional: Positive for fever, chills, activity change, appetite change and fatigue. Negative for diaphoresis and unexpected weight change.  HENT: Negative for mouth sores and neck stiffness.   Eyes: Negative for visual disturbance.  Respiratory: Negative for cough, chest tightness, shortness of breath and wheezing.   Cardiovascular: Negative for chest pain.  Gastrointestinal: Positive for nausea and vomiting. Negative for abdominal pain, diarrhea and constipation.  Endocrine: Negative for polydipsia, polyphagia and polyuria.  Genitourinary: Negative for dysuria, urgency, frequency and hematuria.  Musculoskeletal: Positive for back pain.  Skin: Negative for rash.  Allergic/Immunologic: Negative for immunocompromised state.  Neurological: Negative for syncope, light-headedness and headaches.  Hematological: Does not bruise/bleed easily.  Psychiatric/Behavioral: Negative for sleep disturbance. The patient is not nervous/anxious.     Allergies  Review of patient's allergies indicates no known allergies.  Home Medications   Current Outpatient Rx  Name  Route  Sig  Dispense  Refill  . albuterol (PROVENTIL HFA;VENTOLIN HFA) 108 (90 BASE) MCG/ACT inhaler   Inhalation   Inhale 2 puffs into the lungs every 6 (six) hours as needed for wheezing.         Marland Kitchen atorvastatin (LIPITOR) 80 MG tablet   Oral   Take 40 mg by mouth every morning.          Marland Kitchen buPROPion (WELLBUTRIN) 75 MG tablet   Oral   Take 75 mg by mouth 2 (two) times daily.         . citalopram (CELEXA) 40 MG tablet   Oral   Take 40 mg by mouth every morning.          . ferrous sulfate 325 (65 FE) MG tablet   Oral   Take 325 mg by mouth 2 (two) times daily with a meal.          . hydrochlorothiazide (HYDRODIURIL) 25 MG tablet   Oral   Take 12.5 mg by mouth every morning.          . lamoTRIgine (LAMICTAL) 200 MG tablet   Oral   Take 100-200 mg by mouth 2 (two) times daily. Takes 200 mg in am and 100 mg in pm         . lisinopril (PRINIVIL,ZESTRIL) 20 MG tablet   Oral   Take 20 mg by mouth every morning.          Marland Kitchen LORazepam (ATIVAN) 0.5 MG tablet   Oral   Take 0.5 mg by mouth every 8 (eight) hours as needed for anxiety.         . metFORMIN (GLUCOPHAGE) 1000 MG tablet   Oral   Take 1,000 mg by mouth 2 (two) times daily with a meal.         . omega-3 acid ethyl esters (LOVAZA) 1 G capsule   Oral  Take 1 g by mouth 2 (two) times daily.          . ondansetron (ZOFRAN) 4 MG tablet   Oral   Take 4 mg by mouth every 4 (four) hours as needed for nausea.         Marland Kitchen oxyCODONE-acetaminophen (PERCOCET) 7.5-325 MG per tablet   Oral   Take 1 tablet by mouth every 6 (six) hours as needed for pain.   30 tablet   0   . promethazine (PHENERGAN) 12.5 MG tablet   Oral   Take 2 tablets (25 mg total) by mouth every 6 (six) hours as needed for nausea.   30 tablet   1   . sitaGLIPtin (JANUVIA) 50 MG tablet   Oral   Take 50 mg by mouth every morning.          . traMADol-acetaminophen (ULTRACET) 37.5-325 MG per tablet   Oral   Take 1 tablet by mouth every 6 (six) hours as needed for pain.         . Vitamin D, Ergocalciferol, (DRISDOL) 50000 UNITS CAPS   Oral   Take 50,000 Units by mouth every 7 (seven) days. Sunday          BP 135/73  Pulse 103  Temp(Src) 101.4 F (38.6 C) (Rectal)  Resp 22  Ht 5\' 6"  (1.676 m)  Wt 247 lb (112.038 kg)  BMI 39.89 kg/m2  SpO2 97%  LMP 04/27/2013 Physical Exam  Nursing note and vitals reviewed. Constitutional: She is oriented to person, place, and time. She appears well-developed and well-nourished. No distress.  Ill-appearing  HENT:  Head: Normocephalic and atraumatic.  Mouth/Throat:  Oropharynx is clear and moist. No oropharyngeal exudate.  Eyes: Conjunctivae are normal. Pupils are equal, round, and reactive to light. No scleral icterus.  Neck: Normal range of motion. Neck supple.  Cardiovascular: Normal rate, regular rhythm, S1 normal, S2 normal, normal heart sounds and intact distal pulses.   No murmur heard. Pulses:      Radial pulses are 2+ on the right side, and 2+ on the left side.       Dorsalis pedis pulses are 2+ on the right side, and 2+ on the left side.       Posterior tibial pulses are 2+ on the right side, and 2+ on the left side.  Tachycardic  Pulmonary/Chest: Effort normal and breath sounds normal. No respiratory distress. She has no wheezes. She has no rales. She exhibits no tenderness.  Abdominal: Soft. Bowel sounds are normal. She exhibits no distension and no mass. There is no tenderness. There is no rebound and no guarding.  No CVA tenderness  Musculoskeletal: Normal range of motion. She exhibits no edema.  Lymphadenopathy:    She has no cervical adenopathy.  Neurological: She is alert and oriented to person, place, and time. She exhibits normal muscle tone. Coordination normal.  Speech is clear and goal oriented Moves extremities without ataxia  Skin: Skin is warm. No rash noted. She is diaphoretic. No erythema. There is pallor.  Patient appears pale  Psychiatric: She has a normal mood and affect.    ED Course   Procedures (including critical care time)  Labs Reviewed  CBC WITH DIFFERENTIAL - Abnormal; Notable for the following:    WBC 17.6 (*)    Hemoglobin 11.8 (*)    RDW 16.5 (*)    Neutrophils Relative % 85 (*)    Neutro Abs 15.0 (*)    Lymphocytes Relative 8 (*)  All other components within normal limits  COMPREHENSIVE METABOLIC PANEL - Abnormal; Notable for the following:    Sodium 132 (*)    Potassium 3.1 (*)    Chloride 90 (*)    Glucose, Bld 165 (*)    Creatinine, Ser 1.68 (*)    Albumin 3.0 (*)    GFR calc non Af Amer  35 (*)    GFR calc Af Amer 40 (*)    All other components within normal limits  URINALYSIS, ROUTINE W REFLEX MICROSCOPIC - Abnormal; Notable for the following:    APPearance CLOUDY (*)    Hgb urine dipstick LARGE (*)    Protein, ur 30 (*)    Leukocytes, UA LARGE (*)    All other components within normal limits  CG4 I-STAT (LACTIC ACID) - Abnormal; Notable for the following:    Lactic Acid, Venous 3.44 (*)    All other components within normal limits  CULTURE, BLOOD (ROUTINE X 2)  CULTURE, BLOOD (ROUTINE X 2)  URINE CULTURE  PROCALCITONIN  LIPASE, BLOOD  URINE MICROSCOPIC-ADD ON   Dg Chest 2 View  07/01/2013   *RADIOLOGY REPORT*  Clinical Data: Fever.  Nausea.  Vomiting.  Kidney stones.  CHEST - 2 VIEW  Comparison: 06/02/2009.  Findings:  Cardiopericardial silhouette within normal limits. Mediastinal contours normal. Trachea midline.  No airspace disease or effusion.  IMPRESSION: No active cardiopulmonary disease.   Original Report Authenticated By: Andreas Newport, M.D.   1. SIRS (systemic inflammatory response syndrome)   2. Fever   3. DM2 (diabetes mellitus, type 2)   4. AKI (acute kidney injury)   5. HTN (hypertension)     MDM  Dawson Bills patient with 2 episodes of urosepsis in the past month. Stent removal 3 days ago with persistent nausea and vomiting as well as fever to 102.  She tachycardic on arrival. UA with large leukocytes large amount hemoglobin and white blood cells too numerous to count.  Lactic acid 3.4, CBC with leukocytosis 17.6. Mild hypokalemia at 3.1 and evidence of acute kidney injury with a creatinine of 1.68.  Her calcitonin and lipase within normal limits. Cultures pending. Chest x-ray without evidence of acute changes.  I personally reviewed the imaging tests through PACS system.  I reviewed available ER/hospitalization records through the EMR.    12:01 AM Patient with decreasing heart rate but still tachycardic. Patient without hypertension. Alert and  oriented. Vital signs stable. Patient remains febrile.  Will consult for admission.  Dr. Denton Lank was consulted, evaluated this patient with me and agrees with the plan.        Dahlia Client Evynn Boutelle, PA-C 07/02/13 0001

## 2013-07-01 NOTE — ED Notes (Addendum)
Pt had bil kidney stints removed on Thursday, now has nausea, vomiting and fever. Pt took pain pil with 325mg  Tylenol around 1900

## 2013-07-01 NOTE — ED Notes (Signed)
Critical I stat Lactic - [place] reported to Dr Denton Lank [phone]

## 2013-07-02 ENCOUNTER — Encounter (HOSPITAL_COMMUNITY): Payer: Self-pay | Admitting: Internal Medicine

## 2013-07-02 DIAGNOSIS — N390 Urinary tract infection, site not specified: Secondary | ICD-10-CM

## 2013-07-02 DIAGNOSIS — E876 Hypokalemia: Secondary | ICD-10-CM | POA: Diagnosis present

## 2013-07-02 DIAGNOSIS — N179 Acute kidney failure, unspecified: Secondary | ICD-10-CM | POA: Diagnosis present

## 2013-07-02 DIAGNOSIS — D72829 Elevated white blood cell count, unspecified: Secondary | ICD-10-CM | POA: Diagnosis present

## 2013-07-02 DIAGNOSIS — A419 Sepsis, unspecified organism: Secondary | ICD-10-CM

## 2013-07-02 DIAGNOSIS — E119 Type 2 diabetes mellitus without complications: Secondary | ICD-10-CM

## 2013-07-02 DIAGNOSIS — N10 Acute tubulo-interstitial nephritis: Secondary | ICD-10-CM

## 2013-07-02 HISTORY — DX: Sepsis, unspecified organism: A41.9

## 2013-07-02 HISTORY — DX: Urinary tract infection, site not specified: N39.0

## 2013-07-02 HISTORY — DX: Acute kidney failure, unspecified: N17.9

## 2013-07-02 LAB — LACTIC ACID, PLASMA
Lactic Acid, Venous: 1.5 mmol/L (ref 0.5–2.2)
Lactic Acid, Venous: 1.5 mmol/L (ref 0.5–2.2)

## 2013-07-02 LAB — BASIC METABOLIC PANEL
CO2: 25 mEq/L (ref 19–32)
Calcium: 8.5 mg/dL (ref 8.4–10.5)
Chloride: 95 mEq/L — ABNORMAL LOW (ref 96–112)
Sodium: 133 mEq/L — ABNORMAL LOW (ref 135–145)

## 2013-07-02 LAB — CBC
MCH: 26.6 pg (ref 26.0–34.0)
Platelets: 257 10*3/uL (ref 150–400)
RBC: 3.68 MIL/uL — ABNORMAL LOW (ref 3.87–5.11)
WBC: 14.5 10*3/uL — ABNORMAL HIGH (ref 4.0–10.5)

## 2013-07-02 LAB — GLUCOSE, CAPILLARY
Glucose-Capillary: 142 mg/dL — ABNORMAL HIGH (ref 70–99)
Glucose-Capillary: 131 mg/dL — ABNORMAL HIGH (ref 70–99)

## 2013-07-02 MED ORDER — OXYCODONE-ACETAMINOPHEN 7.5-325 MG PO TABS: 1.0000 | ORAL_TABLET | Freq: Four times a day (QID) | ORAL | Status: DC | PRN

## 2013-07-02 MED ORDER — POTASSIUM CHLORIDE CRYS ER 20 MEQ PO TBCR
20.0000 meq | EXTENDED_RELEASE_TABLET | Freq: Once | ORAL | Status: AC
  Administered 2013-07-02: 20 meq via ORAL
  Filled 2013-07-02: qty 1

## 2013-07-02 MED ORDER — PANTOPRAZOLE SODIUM 40 MG PO TBEC
40.0000 mg | DELAYED_RELEASE_TABLET | Freq: Every day | ORAL | Status: DC
  Administered 2013-07-02 – 2013-07-03 (×2): 40 mg via ORAL
  Filled 2013-07-02 (×2): qty 1

## 2013-07-02 MED ORDER — LORAZEPAM 0.5 MG PO TABS: 0.5000 mg | ORAL_TABLET | Freq: Three times a day (TID) | ORAL | Status: DC | PRN

## 2013-07-02 MED ORDER — FLUCONAZOLE IN SODIUM CHLORIDE 400-0.9 MG/200ML-% IV SOLN
400.0000 mg | Freq: Every day | INTRAVENOUS | Status: DC
  Administered 2013-07-02: 400 mg via INTRAVENOUS
  Filled 2013-07-02: qty 200

## 2013-07-02 MED ORDER — GI COCKTAIL ~~LOC~~
30.0000 mL | Freq: Once | ORAL | Status: AC
  Administered 2013-07-02: 30 mL via ORAL
  Filled 2013-07-02 (×2): qty 30

## 2013-07-02 MED ORDER — LAMOTRIGINE 100 MG PO TABS
100.0000 mg | ORAL_TABLET | Freq: Every day | ORAL | Status: DC
  Administered 2013-07-02 (×2): 100 mg via ORAL
  Filled 2013-07-02 (×3): qty 1

## 2013-07-02 MED ORDER — SODIUM CHLORIDE 0.9 % IJ SOLN
3.0000 mL | Freq: Two times a day (BID) | INTRAMUSCULAR | Status: DC
  Administered 2013-07-02 – 2013-07-03 (×2): 3 mL via INTRAVENOUS

## 2013-07-02 MED ORDER — CITALOPRAM HYDROBROMIDE 40 MG PO TABS
40.0000 mg | ORAL_TABLET | Freq: Every morning | ORAL | Status: DC
  Administered 2013-07-02 – 2013-07-03 (×2): 40 mg via ORAL
  Filled 2013-07-02 (×2): qty 1

## 2013-07-02 MED ORDER — OXYCODONE-ACETAMINOPHEN 5-325 MG PO TABS
1.0000 | ORAL_TABLET | Freq: Four times a day (QID) | ORAL | Status: DC | PRN
  Administered 2013-07-02 – 2013-07-03 (×3): 1 via ORAL
  Filled 2013-07-02 (×3): qty 1

## 2013-07-02 MED ORDER — BUPROPION HCL 75 MG PO TABS
75.0000 mg | ORAL_TABLET | Freq: Two times a day (BID) | ORAL | Status: DC
  Administered 2013-07-02 – 2013-07-03 (×4): 75 mg via ORAL
  Filled 2013-07-02 (×5): qty 1

## 2013-07-02 MED ORDER — SODIUM CHLORIDE 0.9 % IV SOLN: INTRAVENOUS | Status: DC

## 2013-07-02 MED ORDER — HEPARIN SODIUM (PORCINE) 5000 UNIT/ML IJ SOLN
5000.0000 [IU] | Freq: Three times a day (TID) | INTRAMUSCULAR | Status: DC
  Administered 2013-07-02 – 2013-07-03 (×5): 5000 [IU] via SUBCUTANEOUS
  Filled 2013-07-02 (×8): qty 1

## 2013-07-02 MED ORDER — FERROUS SULFATE 325 (65 FE) MG PO TABS
325.0000 mg | ORAL_TABLET | Freq: Two times a day (BID) | ORAL | Status: DC
Start: 2013-07-02 — End: 2013-07-03
  Administered 2013-07-02 – 2013-07-03 (×3): 325 mg via ORAL
  Filled 2013-07-02 (×5): qty 1

## 2013-07-02 MED ORDER — OXYCODONE HCL 5 MG PO TABS
2.5000 mg | ORAL_TABLET | Freq: Four times a day (QID) | ORAL | Status: DC | PRN
  Administered 2013-07-03: 2.5 mg via ORAL
  Filled 2013-07-02: qty 1

## 2013-07-02 MED ORDER — SODIUM CHLORIDE 0.9 % IV SOLN
INTRAVENOUS | Status: DC
  Administered 2013-07-02: 10 mL via INTRAVENOUS

## 2013-07-02 MED ORDER — INSULIN ASPART 100 UNIT/ML ~~LOC~~ SOLN
0.0000 [IU] | Freq: Three times a day (TID) | SUBCUTANEOUS | Status: DC
  Administered 2013-07-02 (×2): 2 [IU] via SUBCUTANEOUS

## 2013-07-02 MED ORDER — OMEGA-3-ACID ETHYL ESTERS 1 G PO CAPS
1.0000 g | ORAL_CAPSULE | Freq: Two times a day (BID) | ORAL | Status: DC
  Administered 2013-07-02 – 2013-07-03 (×4): 1 g via ORAL
  Filled 2013-07-02 (×5): qty 1

## 2013-07-02 MED ORDER — TRAMADOL-ACETAMINOPHEN 37.5-325 MG PO TABS: 1.0000 | ORAL_TABLET | Freq: Four times a day (QID) | ORAL | Status: DC | PRN

## 2013-07-02 MED ORDER — POTASSIUM CHLORIDE 10 MEQ/100ML IV SOLN
10.0000 meq | INTRAVENOUS | Status: AC
  Administered 2013-07-02 (×2): 10 meq via INTRAVENOUS
  Filled 2013-07-02: qty 200

## 2013-07-02 MED ORDER — POTASSIUM CHLORIDE CRYS ER 20 MEQ PO TBCR
40.0000 meq | EXTENDED_RELEASE_TABLET | Freq: Once | ORAL | Status: AC
  Administered 2013-07-02: 40 meq via ORAL
  Filled 2013-07-02 (×2): qty 1

## 2013-07-02 MED ORDER — PIPERACILLIN-TAZOBACTAM 3.375 G IVPB
3.3750 g | Freq: Once | INTRAVENOUS | Status: DC
  Filled 2013-07-02: qty 50

## 2013-07-02 MED ORDER — FLUCONAZOLE IN SODIUM CHLORIDE 200-0.9 MG/100ML-% IV SOLN
200.0000 mg | Freq: Every day | INTRAVENOUS | Status: DC
  Administered 2013-07-02: 200 mg via INTRAVENOUS
  Filled 2013-07-02 (×2): qty 100

## 2013-07-02 MED ORDER — MEROPENEM 1 G IV SOLR
1.0000 g | Freq: Three times a day (TID) | INTRAVENOUS | Status: DC
  Administered 2013-07-02 – 2013-07-03 (×5): 1 g via INTRAVENOUS
  Filled 2013-07-02 (×7): qty 1

## 2013-07-02 MED ORDER — HYDROMORPHONE HCL PF 1 MG/ML IJ SOLN
1.0000 mg | INTRAMUSCULAR | Status: AC | PRN
  Administered 2013-07-02: 1 mg via INTRAVENOUS
  Filled 2013-07-02: qty 1

## 2013-07-02 MED ORDER — ADULT MULTIVITAMIN W/MINERALS CH
1.0000 | ORAL_TABLET | Freq: Every day | ORAL | Status: DC
  Administered 2013-07-02 – 2013-07-03 (×2): 1 via ORAL
  Filled 2013-07-02 (×2): qty 1

## 2013-07-02 MED ORDER — ONDANSETRON HCL 4 MG PO TABS: 4.0000 mg | ORAL_TABLET | ORAL | Status: DC | PRN

## 2013-07-02 MED ORDER — ALBUTEROL SULFATE HFA 108 (90 BASE) MCG/ACT IN AERS
2.0000 | INHALATION_SPRAY | Freq: Four times a day (QID) | RESPIRATORY_TRACT | Status: DC | PRN
  Filled 2013-07-02: qty 6.7

## 2013-07-02 MED ORDER — PROMETHAZINE HCL 25 MG PO TABS: 25.0000 mg | ORAL_TABLET | Freq: Four times a day (QID) | ORAL | Status: DC | PRN

## 2013-07-02 MED ORDER — ONDANSETRON HCL 4 MG/2ML IJ SOLN
4.0000 mg | Freq: Three times a day (TID) | INTRAMUSCULAR | Status: AC | PRN
  Administered 2013-07-02 (×2): 4 mg via INTRAVENOUS
  Filled 2013-07-02 (×2): qty 2

## 2013-07-02 MED ORDER — LAMOTRIGINE 200 MG PO TABS
200.0000 mg | ORAL_TABLET | Freq: Every day | ORAL | Status: DC
  Administered 2013-07-02 – 2013-07-03 (×2): 200 mg via ORAL
  Filled 2013-07-02 (×2): qty 1

## 2013-07-02 MED ORDER — ACETAMINOPHEN 325 MG PO TABS
650.0000 mg | ORAL_TABLET | Freq: Four times a day (QID) | ORAL | Status: DC | PRN
  Administered 2013-07-02: 650 mg via ORAL
  Filled 2013-07-02: qty 2

## 2013-07-02 MED ORDER — VITAMIN D (ERGOCALCIFEROL) 1.25 MG (50000 UNIT) PO CAPS: 50000.0000 [IU] | ORAL_CAPSULE | ORAL | Status: DC

## 2013-07-02 NOTE — Progress Notes (Addendum)
[facility] PROGRESS NOTE  Grace Singh ZOX:[id number] DOB: Mar 05, 1966 DOA: 07/01/2013 PCP: Provider Not In System  Brief narrative: 47 year old female with past medical history of recurrent UTI's, anxiety and depression, diabetes who presented to [facility] 07/01/2013 with ongoing nausea, vomiting and fevers. Patient has had bilateral ureteral stents placed in 05/2013, has had then replaced in early 06/2013 and then removed 06/28/2013. She has completed Levaquin 06/29/2013. In ED, her BP was 112/54, HR 80 and tmax 102.6 F. Patient was found to have numerous bacteria and yeast in urine, final cultures still pending. WBC count was 17.6 and hemoglobin was 11.8. BMP revealed sodium of 132, potassium of 3.1 and creatinine 1.68.   Assessment/Plan:  Principal Problem:   Severe sepsis - secondary to bacterial and yeast UTI - hemodynamically stable, not requiring pressor support - analgesia with oxycodone 2.5 mg every 6 hours PO PRN; antiemetics PRN - continue fluconazole and meropenem until final urine culture results are available    Leukocytosis - secondary to sepsis - procalcitonin level 0.12 on admission and lactic acid WNL - WBC count is trending down Active Problems:   Hypokalemia - secondary to GI losses - repleted with total of 20 meq PO potassium then 2 doses of IV potassium 10 meq - potassium then 2.9 so given additional 40 meq PO   DM2 (diabetes mellitus, type 2) - A1c in 05/2013 6.6 - continue novolog sliding scale   AKI (acute kidney injury) - secondary to sepsis and UTI - creatinine improved with IV fluids - follow up renal function in am   Anxiety and depression - continue Wellbutrin and citalopram   Code Status: full code Family Communication: no family at the bedside Disposition Plan: home when stable  Manson Passey, MD  [facility] Pager [phone]  If 7PM-7AM, please contact night-coverage [email or web address] Password [email or web address] 07/02/2013, 2:05 PM   LOS: 1 day    Consultants:  None   Procedures:  None   Antibiotics:  Fluconazole  Levaquin   HPI/Subjective: No acute events since admission.   Objective: Filed Vitals:   07/02/13 0800 07/02/13 1000 07/02/13 1125 07/02/13 1133  BP:  141/85  115/66  Pulse: 85  77   Temp: 99.3 F (37.4 C)  98.2 F (36.8 C)   TempSrc: Oral  Oral   Resp: 19 16 20    Height:      Weight:      SpO2: 98% 98% 97%     Intake/Output Summary (Last 24 hours) at 07/02/13 1405 Last data filed at 07/02/13 1055  Gross per 24 hour  Intake   1868 ml  Output   3250 ml  Net  -1382 ml    Exam:   General:  Pt is alert, follows commands appropriately, not in acute distress  Cardiovascular: Regular rate and rhythm, S1/S2, no murmurs, no rubs, no gallops  Respiratory: Clear to auscultation bilaterally, no wheezing, no crackles, no rhonchi  Abdomen: Soft, non tender, non distended, bowel sounds present, no guarding  Extremities: No edema, pulses DP and PT palpable bilaterally  Neuro: Grossly nonfocal  Data Reviewed: Basic Metabolic Panel:  Recent Labs Lab 07/01/13 2115 07/02/13 0335  NA 132* 133*  K 3.1* 2.9*  CL 90* 95*  CO2 26 25  GLUCOSE 165* 155*  BUN 23 17  CREATININE 1.68* 1.19*  CALCIUM 9.6 8.5   Liver Function Tests:  Recent Labs Lab 07/01/13 2115  AST 9  ALT 9  ALKPHOS 97  BILITOT 0.4  PROT 6.7  ALBUMIN  3.0*    Recent Labs Lab 07/01/13 2115  LIPASE 17   No results found for this basename: AMMONIA,  in the last 168 hours CBC:  Recent Labs Lab 07/01/13 2115 07/02/13 0335  WBC 17.6* 14.5*  NEUTROABS 15.0*  --   HGB 11.8* 9.8*  HCT 36.4 29.8*  MCV 80.7 81.0  PLT 319 257   Cardiac Enzymes: No results found for this basename: CKTOTAL, CKMB, CKMBINDEX, TROPONINI,  in the last 168 hours BNP: No components found with this basename: POCBNP,  CBG:  Recent Labs Lab 07/02/13 1101  GLUCAP 143*    No results found for this or any previous visit (from the past  240 hour(s)).   Studies: Dg Chest 2 View 07/01/2013   * IMPRESSION: No active cardiopulmonary disease.   Original Report Authenticated By: Andreas Newport, M.D.    Scheduled Meds: . buPROPion  75 mg Oral BID  . citalopram  40 mg Oral q morning - 10a  . ferrous sulfate  325 mg Oral BID WC  . fluconazole (DIFLUCAN)   200 mg Intravenous QHS  . heparin  5,000 Units Subcutaneous Q8H  . insulin aspart  0-15 Units Subcutaneous TID WC  . lamoTRIgine  100 mg Oral QHS  . lamoTRIgine  200 mg Oral Daily  . meropenem (MERREM)   1 g Intravenous Q8H  . multivitamin   1 tablet Oral Daily

## 2013-07-02 NOTE — Progress Notes (Signed)
Antibiotic Consults  40 yof with h/o recurrent kidney stones, UTIs s/p bilateral ureteral stenting 6/6 (admission with complicated ICU stay) with stents removed 06/28/13. Pt presented 7/27 with c/o N/V, fever. Patient completed a course of Levaquin 7/25. Found septic in the ED likely 2/2 UTI. Covering for pseudomonas found last admission with meropenem and empiric Diflucan for yeast found per UA in the ED.   PTA >> Levaquin >> 7/25  7/28 >> Meropenem >>  7/28 >> Fluconazole >>   Tmax 102.6, afebrile since this AM. WBC improving to 14.5K, Scr improved to 1.19 for CrCl of 71 ml/min.   Cultures: Previous admit 7/17 blood x 2 >> pseudomonas (pansensitive) 7/27 blood x 2 >> pending  7/27 urine >> pending  Plan: Given urinary indication for Fluconazole, will change to Fluconazole 200 mg IV q24h - change to PO when able. Continue Meropenem 1g IV q8h. F/u cultures.  Geoffry Paradise, PharmD, BCPS Pager: [phone] 11:43 AM Pharmacy #: [phone]

## 2013-07-02 NOTE — Plan of Care (Signed)
Problem: Consults Goal: Diabetes Guidelines if Diabetic/Glucose > 140 If diabetic or lab glucose is > 140 mg/dl - Initiate Diabetes/Hyperglycemia Guidelines & Document Interventions  Outcome: Completed/Met Date Met:  07/02/13 ACHS blood sugar chacks

## 2013-07-02 NOTE — Progress Notes (Signed)
INITIAL NUTRITION ASSESSMENT  DOCUMENTATION CODES Per approved criteria  -Morbid Obesity   INTERVENTION: Encourage PO intake Provide Multivitamin with minerals daily  NUTRITION DIAGNOSIS: Inadequate oral intake related to nausea,vomiting, poor appetite as evidenced by 4% wt loss in less than 2 months.   Goal: Pt to meet >/= 90% of their estimated nutrition needs   Monitor:  PO intake Weight Labs  Reason for Assessment: Malnutrition Screening Tool, score of 4  47 y.o. female  Admitting Dx: Severe sepsis  ASSESSMENT: 60 yof with h/o recurrent kidney stones, UTIs s/p bilateral ureteral stenting 6/6 (admission with complicated ICU stay) with stents removed 06/28/13. Pt presented 7/27 with c/o N/V, fever. RD met with pt at previous admission and pt reported usual body weight of 280 lbs. She weighed 252 lbs (per pt report) 7/16 and now weighs 247 lbs- 2% wt loss in less than 2 weeks. Pt reports she has been eating poorly due to nausea and vomiting; she had a few bites at breakfast today and was about to attempt to eat lunch. Pt is not interested in any nutritional supplements. Encourage PO intake with a variety of foods and slow healthy weight loss of 1-2 pounds per week.  Height: Ht Readings from Last 1 Encounters:  07/02/13 5\' 5"  (1.651 m)    Weight: Wt Readings from Last 1 Encounters:  07/01/13 247 lb (112.038 kg)    Ideal Body Weight: 125 lbs  % Ideal Body Weight: 198%  Wt Readings from Last 10 Encounters:  07/01/13 247 lb (112.038 kg)  06/22/13 277 lb 1.9 oz (125.7 kg)  06/20/13 263 lb 5 oz (119.438 kg)  06/20/13 263 lb 5 oz (119.438 kg)  06/13/13 256 lb 5 oz (116.263 kg)  06/13/13 256 lb 5 oz (116.263 kg)  05/13/13 281 lb 12 oz (127.8 kg)  05/13/13 281 lb 12 oz (127.8 kg)  04/16/13 263 lb (119.296 kg)    Usual Body Weight: 280 lbs  % Usual Body Weight: 88%  BMI:  Body mass index is 41.1 kg/(m^2).  Estimated Nutritional Needs: Kcal: 2200-2400 Protein:  112-134 grams Fluid: 2.8-2.9 L  Skin: perineum incision  Diet Order: Carb Control  EDUCATION NEEDS: -No education needs identified at this time   Intake/Output Summary (Last 24 hours) at 07/02/13 1216 Last data filed at 07/02/13 1055  Gross per 24 hour  Intake   1868 ml  Output   3250 ml  Net  -1382 ml    Last BM: 7/28  Labs:   Recent Labs Lab 07/01/13 2115 07/02/13 0335  NA 132* 133*  K 3.1* 2.9*  CL 90* 95*  CO2 26 25  BUN 23 17  CREATININE 1.68* 1.19*  CALCIUM 9.6 8.5  GLUCOSE 165* 155*    CBG (last 3)   Recent Labs  07/02/13 1101  GLUCAP 143*    Scheduled Meds: . buPROPion  75 mg Oral BID  . citalopram  40 mg Oral q morning - 10a  . ferrous sulfate  325 mg Oral BID WC  . fluconazole (DIFLUCAN) IV  200 mg Intravenous QHS  . heparin  5,000 Units Subcutaneous Q8H  . insulin aspart  0-15 Units Subcutaneous TID WC  . lamoTRIgine  100 mg Oral QHS  . lamoTRIgine  200 mg Oral Daily  . meropenem (MERREM) IV  1 g Intravenous Q8H  . omega-3 acid ethyl esters  1 g Oral BID  . sodium chloride  3 mL Intravenous Q12H  . [START ON 07/08/2013] Vitamin D (Ergocalciferol)  50,000 Units Oral Q7 days    Continuous Infusions: . sodium chloride 1,000 mL (07/02/13 0112)    Past Medical History  Diagnosis Date  . Hypertension   . Anxiety   . Depression   . Fibromyalgia   . Hyperlipidemia   . Seasonal asthma   . Diabetes mellitus, type 2   . CKD (chronic kidney disease), stage III     In record, but baseline kidney function appears to have GFR > 90  . Wears glasses   . Arthritis     TAILBONE AREA  . Iron deficiency anemia   . Bilateral hydronephrosis   . History of sepsis     05-11-2013  W/ UROSEPSIS---  RESOLIVED  . Normal cardiac stress test     PER PT 2007  . Atrophic kidney     LEFT  . Renal calculi     bilateral    Past Surgical History  Procedure Laterality Date  . Coccyx removal  2009  . Hysteroscopy w/d&c N/A 04/20/2013    Procedure:  DILATATION AND CURETTAGE /HYSTEROSCOPY;  Surgeon: Zelphia Cairo, MD;  Location: [facility];  Service: Gynecology;  Laterality: N/A;  . Polypectomy N/A 04/20/2013    Procedure: POLYPECTOMY;  Surgeon: Zelphia Cairo, MD;  Location: [facility];  Service: Gynecology;  Laterality: N/A;  . Cystoscopy with stent placement Bilateral 05/11/2013    Procedure: CYSTOSCOPY WITH STENT PLACEMENT;  Surgeon: Sebastian Ache, MD;  Location: [facility];  Service: Urology;  Laterality: Bilateral;  . Laparoscopic cholecystectomy  AGE 89  . Appendectomy  AGE 36  . Cystoscopy with retrograde pyelogram, ureteroscopy and stent placement Right 06/13/2013    Procedure: First Stage Bilateral Ureteroscopy;  Surgeon: Sebastian Ache, MD;  Location: [facility];  Service: Urology;  Laterality: Right;  . Holmium laser application Right 06/13/2013    Procedure: HOLMIUM LASER APPLICATION, right ureteral stone;  Surgeon: Sebastian Ache, MD;  Location: [facility];  Service: Urology;  Laterality: Right;  . Cystoscopy w/ ureteral stent placement Right 06/13/2013    Procedure: CYSTOSCOPY WITH STENT REPLACEMENT, right;  Surgeon: Sebastian Ache, MD;  Location: [facility];  Service: Urology;  Laterality: Right;  . Cystoscopy with retrograde pyelogram, ureteroscopy and stent placement Bilateral 06/20/2013    Procedure: CYSTOSCOPY WITH RETROGRADE PYELOGRAM, URETEROSCOPY AND STENT PLACEMENT, REMOVAL BILATERAL STENTS;  Surgeon: Sebastian Ache, MD;  Location: [facility];  Service: Urology;  Laterality: Bilateral;  Bilateral stent repalcement with not string  . Holmium laser application Right 06/20/2013    Procedure: HOLMIUM LASER APPLICATION;  Surgeon: Sebastian Ache, MD;  Location: [facility];  Service: Urology;  Laterality: Right;    Ian Malkin RD, LDN Inpatient Clinical Dietitian Pager: [phone] After Hours Pager: [phone]

## 2013-07-02 NOTE — Progress Notes (Signed)
ANTIBIOTIC CONSULT NOTE - INITIAL  Pharmacy Consult for Fluconazole, meropenem Indication: UTI, sepsis,   No Known Allergies  Patient Measurements: Height: 5\' 5"  (165.1 cm) Weight: 247 lb (112.038 kg) IBW/kg (Calculated) : 57 Adjusted Body Weight:   Vital Signs: Temp: 99.8 F (37.7 C) (07/28 0315) Temp src: Oral (07/28 0315) BP: 112/54 mmHg (07/28 0430) Pulse Rate: 84 (07/28 0430) Intake/Output from previous day: 07/27 0701 - 07/28 0700 In: 993 [P.O.:240; I.V.:503; IV Piggyback:250] Out: 800 [Urine:800] Intake/Output from this shift: Total I/O In: 993 [P.O.:240; I.V.:503; IV Piggyback:250] Out: 800 [Urine:800]  Labs:  Recent Labs  07/01/13 2115 07/02/13 0335  WBC 17.6* 14.5*  HGB 11.8* 9.8*  PLT 319 257  CREATININE 1.68* 1.19*   Estimated Creatinine Clearance: 71.3 ml/min (by C-G formula based on Cr of 1.19). No results found for this basename: VANCOTROUGH, VANCOPEAK, VANCORANDOM, GENTTROUGH, GENTPEAK, GENTRANDOM, TOBRATROUGH, TOBRAPEAK, TOBRARND, AMIKACINPEAK, AMIKACINTROU, AMIKACIN,  in the last 72 hours   Microbiology: Recent Results (from the past 720 hour(s))  CULTURE, BLOOD (ROUTINE X 2)     Status: None   Collection Time    06/21/13  6:16 AM      Result Value Range Status   Specimen Description BLOOD LEFT ANTECUBITAL   Final   Special Requests BOTTLES DRAWN AEROBIC AND ANAEROBIC 10CC   Final   Culture  Setup Time 06/21/2013 09:32   Final   Culture     Final   Value: PSEUDOMONAS AERUGINOSA     Note: Gram Stain Report Called to,Read Back By and Verified With: ANGELA ASHLEY@[email or web address]  ON [id number] BY [facility]   Report Status 06/24/2013 FINAL   Final   Organism ID, Bacteria PSEUDOMONAS AERUGINOSA   Final  CULTURE, BLOOD (ROUTINE X 2)     Status: None   Collection Time    06/21/13  6:38 AM      Result Value Range Status   Specimen Description BLOOD LEFT HAND   Final   Special Requests BOTTLES DRAWN AEROBIC AND ANAEROBIC   Final   Culture  Setup Time 06/21/2013  09:32   Final   Culture NO GROWTH 5 DAYS   Final   Report Status 06/27/2013 FINAL   Final  URINE CULTURE     Status: None   Collection Time    06/21/13  7:37 AM      Result Value Range Status   Specimen Description URINE, CATHETERIZED   Final   Special Requests NONE   Final   Culture  Setup Time 06/21/2013 12:17   Final   Colony Count NO GROWTH   Final   Culture NO GROWTH   Final   Report Status 06/22/2013 FINAL   Final  MRSA PCR SCREENING     Status: None   Collection Time    06/21/13 11:17 AM      Result Value Range Status   MRSA by PCR NEGATIVE  NEGATIVE Final   Comment:            The GeneXpert MRSA Assay (FDA     approved for NASAL specimens     only), is one component of a     comprehensive MRSA colonization     surveillance program. It is not     intended to diagnose MRSA     infection nor to guide or     monitor treatment for     MRSA infections.    Medical History: Past Medical History  Diagnosis Date  . Hypertension   . Anxiety   .  Depression   . Fibromyalgia   . Hyperlipidemia   . Seasonal asthma   . Diabetes mellitus, type 2   . CKD (chronic kidney disease), stage III     In record, but baseline kidney function appears to have GFR > 90  . Wears glasses   . Arthritis     TAILBONE AREA  . Iron deficiency anemia   . Bilateral hydronephrosis   . History of sepsis     05-11-2013  W/ UROSEPSIS---  RESOLIVED  . Normal cardiac stress test     PER PT 2007  . Atrophic kidney     LEFT  . Renal calculi     bilateral    Medications:  Anti-infectives   Start     Dose/Rate Route Frequency Ordered Stop   07/02/13 0130  fluconazole (DIFLUCAN) IVPB 400 mg     400 mg 100 mL/hr over 120 Minutes Intravenous Daily at bedtime 07/02/13 0123     07/02/13 0045  meropenem (MERREM) 1 g in sodium chloride 0.9 % 100 mL IVPB     1 g 200 mL/hr over 30 Minutes Intravenous 3 times per day 07/02/13 0039     07/02/13 0015  piperacillin-tazobactam (ZOSYN) IVPB 3.375 g   Status:  Discontinued     3.375 g 12.5 mL/hr over 240 Minutes Intravenous  Once 07/02/13 0002 07/02/13 0032     Assessment: Patient with UTI, sepsis, in ED.  MD changed zosyn to meropenem.   Goal of Therapy:  Fluconazole, meropenem dosed based on patient weight and renal function   Plan:  Follow up culture results Continue with ordered meropenem 1gm iv q8hr Fluconazole 400mg  iv q24hr  Darlina Guys, Lysbeth Dicola Crowford 07/02/2013,5:10 AM

## 2013-07-02 NOTE — H&P (Addendum)
[facility] History and Physical  Grace Singh UJW:[id number] DOB: [birth year] DOA: 07/01/2013  Referring physician: ED PCP: Provider Not In System   Chief Complaint: Nausea, vomiting, fever  HPI: Grace Singh is a 47 y.o. female just discharged from the hospital after a stay for recurrent UTIs presents to the ED with progressive n/v/f beginning yesterday morning.  Patients hospital course was complicated including an ICU stay for septic shock with hypotension requiring vasopressors on 05/11/13.  Pt with bilateral ureteral stinting on 05/11/13 pending further procedures to relieve hydronephrosis secondary to bilateral large kidney stones. 06/13/13 the right stent was replaced on 06/20/16 the left stent was replaced. She has undergone lithotripsy and basket retrieval as well. On Thursday 06/28/13 both stints were removed.  On Friday 7/25 she finished the course of levaquin she had been discharged home on.  The organisms she grew out during these stays were a K.Pneumo on 05/11/13 in the urine and a Pseudomonas in blood on 06/21/13 (see culture results for details).  In the ED she was found to be clearly in sepsis with recurrence of UTI: fever 102.6, WBC 17.6k, UA showing large leukocyte esterase, too many to count WBCs.  She denies any flank pain as when she had the hydronephrosis previously suggesting the absence of recurrent obstruction.  Her creatinine is 1.68 (baseline 0.9).   Review of Systems: 12 systems reviewed and otherwise negative.  Past Medical History  Diagnosis Date  . Hypertension   . Anxiety   . Depression   . Fibromyalgia   . Hyperlipidemia   . Seasonal asthma   . Diabetes mellitus, type 2   . CKD (chronic kidney disease), stage III   . Wears glasses   . Arthritis     TAILBONE AREA  . Iron deficiency anemia   . Bilateral hydronephrosis   . History of sepsis     05-11-2013  W/ UROSEPSIS---  RESOLIVED  . Normal cardiac stress test     PER PT 2007  . Atrophic kidney      LEFT  . Renal calculi     bilateral   Past Surgical History  Procedure Laterality Date  . Coccyx removal  2009  . Hysteroscopy w/d&c N/A 04/20/2013    Procedure: DILATATION AND CURETTAGE /HYSTEROSCOPY;  Surgeon: Zelphia Cairo, MD;  Location: [facility];  Service: Gynecology;  Laterality: N/A;  . Polypectomy N/A 04/20/2013    Procedure: POLYPECTOMY;  Surgeon: Zelphia Cairo, MD;  Location: [facility];  Service: Gynecology;  Laterality: N/A;  . Cystoscopy with stent placement Bilateral 05/11/2013    Procedure: CYSTOSCOPY WITH STENT PLACEMENT;  Surgeon: Sebastian Ache, MD;  Location: [facility];  Service: Urology;  Laterality: Bilateral;  . Laparoscopic cholecystectomy  AGE 35  . Appendectomy  AGE 43  . Cystoscopy with retrograde pyelogram, ureteroscopy and stent placement Right 06/13/2013    Procedure: First Stage Bilateral Ureteroscopy;  Surgeon: Sebastian Ache, MD;  Location: [facility];  Service: Urology;  Laterality: Right;  . Holmium laser application Right 06/13/2013    Procedure: HOLMIUM LASER APPLICATION, right ureteral stone;  Surgeon: Sebastian Ache, MD;  Location: [facility];  Service: Urology;  Laterality: Right;  . Cystoscopy w/ ureteral stent placement Right 06/13/2013    Procedure: CYSTOSCOPY WITH STENT REPLACEMENT, right;  Surgeon: Sebastian Ache, MD;  Location: [facility];  Service: Urology;  Laterality: Right;  . Cystoscopy with retrograde pyelogram, ureteroscopy and stent placement Bilateral 06/20/2013    Procedure: CYSTOSCOPY WITH RETROGRADE PYELOGRAM,  URETEROSCOPY AND STENT PLACEMENT, REMOVAL BILATERAL STENTS;  Surgeon: Sebastian Ache, MD;  Location: [facility];  Service: Urology;  Laterality: Bilateral;  Bilateral stent repalcement with not string  . Holmium laser application Right 06/20/2013    Procedure: HOLMIUM LASER APPLICATION;  Surgeon: Sebastian Ache, MD;  Location: [facility];  Service: Urology;   Laterality: Right;   Social History:  reports that she has never smoked. She has never used smokeless tobacco. She reports that she does not drink alcohol or use illicit drugs.   No Known Allergies  No family history on file.  Prior to Admission medications   Medication Sig Start Date End Date Taking? Authorizing Provider  albuterol (PROVENTIL HFA;VENTOLIN HFA) 108 (90 BASE) MCG/ACT inhaler Inhale 2 puffs into the lungs every 6 (six) hours as needed for wheezing.   Yes Historical Provider, MD  atorvastatin (LIPITOR) 80 MG tablet Take 40 mg by mouth every morning.    Yes Historical Provider, MD  buPROPion (WELLBUTRIN) 75 MG tablet Take 75 mg by mouth 2 (two) times daily.   Yes Historical Provider, MD  citalopram (CELEXA) 40 MG tablet Take 40 mg by mouth every morning.    Yes Historical Provider, MD  ferrous sulfate 325 (65 FE) MG tablet Take 325 mg by mouth 2 (two) times daily with a meal.   Yes Historical Provider, MD  hydrochlorothiazide (HYDRODIURIL) 25 MG tablet Take 12.5 mg by mouth every morning.    Yes Historical Provider, MD  lamoTRIgine (LAMICTAL) 200 MG tablet Take 100-200 mg by mouth 2 (two) times daily. Takes 200 mg in am and 100 mg in pm   Yes Historical Provider, MD  lisinopril (PRINIVIL,ZESTRIL) 20 MG tablet Take 20 mg by mouth every morning.    Yes Historical Provider, MD  LORazepam (ATIVAN) 0.5 MG tablet Take 0.5 mg by mouth every 8 (eight) hours as needed for anxiety.   Yes Historical Provider, MD  metFORMIN (GLUCOPHAGE) 1000 MG tablet Take 1,000 mg by mouth 2 (two) times daily with a meal.   Yes Historical Provider, MD  omega-3 acid ethyl esters (LOVAZA) 1 G capsule Take 1 g by mouth 2 (two) times daily.    Yes Historical Provider, MD  ondansetron (ZOFRAN) 4 MG tablet Take 4 mg by mouth every 4 (four) hours as needed for nausea.   Yes Historical Provider, MD  oxyCODONE-acetaminophen (PERCOCET) 7.5-325 MG per tablet Take 1 tablet by mouth every 6 (six) hours as needed for  pain. 06/20/13  Yes Sebastian Ache, MD  promethazine (PHENERGAN) 12.5 MG tablet Take 2 tablets (25 mg total) by mouth every 6 (six) hours as needed for nausea. 06/13/13  Yes Sebastian Ache, MD  sitaGLIPtin (JANUVIA) 50 MG tablet Take 50 mg by mouth every morning.    Yes Historical Provider, MD  traMADol-acetaminophen (ULTRACET) 37.5-325 MG per tablet Take 1 tablet by mouth every 6 (six) hours as needed for pain.   Yes Historical Provider, MD  Vitamin D, Ergocalciferol, (DRISDOL) 50000 UNITS CAPS Take 50,000 Units by mouth every 7 (seven) days. Sunday   Yes Historical Provider, MD   Physical Exam: Filed Vitals:   07/01/13 2013 07/01/13 2258 07/01/13 2259 07/01/13 2300  BP: 121/78 130/71  135/73  Pulse: 118   103  Temp: 101.9 F (38.8 C)  101.4 F (38.6 C)   TempSrc: Oral  Rectal   Resp: 16 27  22   Height: 5\' 6"  (1.676 m)     Weight: 112.038 kg (247 lb)  SpO2: 98%   97%    General:  NAD, toxic appearing, pale appearing Eyes: PEERLA EOMI ENT: mucous membranes moist Neck: supple w/o JVD Cardiovascular: tachycardic w/o MRG Respiratory: CTA B Abdomen: soft, nt, nd, bs+, no CVA tenderness Skin: no rash nor lesion Musculoskeletal: MAE, full ROM all 4 extremities Psychiatric: normal tone and affect Neurologic: AAOx3, grossly non-focal  Labs on Admission:  Basic Metabolic Panel:  Recent Labs Lab 07/01/13 2115  NA 132*  K 3.1*  CL 90*  CO2 26  GLUCOSE 165*  BUN 23  CREATININE 1.68*  CALCIUM 9.6   Liver Function Tests:  Recent Labs Lab 07/01/13 2115  AST 9  ALT 9  ALKPHOS 97  BILITOT 0.4  PROT 6.7  ALBUMIN 3.0*    Recent Labs Lab 07/01/13 2115  LIPASE 17   No results found for this basename: AMMONIA,  in the last 168 hours CBC:  Recent Labs Lab 07/01/13 2115  WBC 17.6*  NEUTROABS 15.0*  HGB 11.8*  HCT 36.4  MCV 80.7  PLT 319   Cardiac Enzymes: No results found for this basename: CKTOTAL, CKMB, CKMBINDEX, TROPONINI,  in the last 168 hours  BNP  (last 3 results) No results found for this basename: PROBNP,  in the last 8760 hours CBG: No results found for this basename: GLUCAP,  in the last 168 hours  Radiological Exams on Admission: Dg Chest 2 View  07/01/2013   *RADIOLOGY REPORT*  Clinical Data: Fever.  Nausea.  Vomiting.  Kidney stones.  CHEST - 2 VIEW  Comparison: 06/02/2009.  Findings:  Cardiopericardial silhouette within normal limits. Mediastinal contours normal. Trachea midline.  No airspace disease or effusion.  IMPRESSION: No active cardiopulmonary disease.   Original Report Authenticated By: Andreas Newport, M.D.    EKG: Independently reviewed.  Assessment/Plan Principal Problem:   Severe sepsis Active Problems:   Acute pyelonephritis   DM2 (diabetes mellitus, type 2)   1.  Severe sepsis secondary to UTI - treating UTI empirically with Merrem (Pseudomonas was sensitive to this, Pseudomonas did have elevated MIC of 64 to zosyn).  Q6H lactates, 3L bolus in ED, followed by continuous IVF at 125 cc/hr at this time, tele monitor, sending patient to SDU, also treating empirically with Diflucan given yeast in urine.  Urine and blood cultures pending.  Tylenol for fever. 2.  AKI - Likely pre-renal, creatinine 1.68 with baseline 0.9, strict intake and output, rehydrating patient. 3.  DM2 - hold home meds and putting patient on mod dose SSI 4.  HTN - holding home meds as patient BPs are borderline in ED, also holding ACEi and Diuretics as nephrotoxic in setting of AKI. 5.  Hypokalemia - 3.1, recheck potassium in AM, replace with 20 meq PO for now.    Code Status: Full Code (must indicate code status--if unknown or must be presumed, indicate so) Family Communication: Spoke with husband at bedside (indicate person spoken with, if applicable, with phone number if by telephone) Disposition Plan: Admit to SDU (indicate anticipated LOS)  Time spent: 70 min  GARDNER, JARED M. [facility] Pager [phone]  If 7PM-7AM,  please contact night-coverage [email or web address] Password [email or web address] 07/02/2013, 1:11 AM

## 2013-07-03 DIAGNOSIS — R6510 Systemic inflammatory response syndrome (SIRS) of non-infectious origin without acute organ dysfunction: Secondary | ICD-10-CM

## 2013-07-03 DIAGNOSIS — R509 Fever, unspecified: Secondary | ICD-10-CM

## 2013-07-03 LAB — BASIC METABOLIC PANEL
CO2: 28 mEq/L (ref 19–32)
Glucose, Bld: 151 mg/dL — ABNORMAL HIGH (ref 70–99)
Potassium: 3.4 mEq/L — ABNORMAL LOW (ref 3.5–5.1)
Sodium: 137 mEq/L (ref 135–145)

## 2013-07-03 LAB — CBC
Hemoglobin: 9.6 g/dL — ABNORMAL LOW (ref 12.0–15.0)
MCH: 26.3 pg (ref 26.0–34.0)
RBC: 3.65 MIL/uL — ABNORMAL LOW (ref 3.87–5.11)

## 2013-07-03 LAB — URINE CULTURE

## 2013-07-03 LAB — GLUCOSE, CAPILLARY

## 2013-07-03 MED ORDER — LEVOFLOXACIN 750 MG PO TABS: 750.0000 mg | ORAL_TABLET | Freq: Every morning | ORAL | Status: AC

## 2013-07-03 MED ORDER — POTASSIUM CHLORIDE ER 20 MEQ PO TBCR: 20.0000 meq | EXTENDED_RELEASE_TABLET | Freq: Two times a day (BID) | ORAL | Status: DC

## 2013-07-03 MED ORDER — AMLODIPINE BESYLATE 5 MG PO TABS: 5.0000 mg | ORAL_TABLET | Freq: Every day | ORAL | Status: DC

## 2013-07-03 MED ORDER — FLUCONAZOLE 100 MG PO TABS: 100.0000 mg | ORAL_TABLET | Freq: Every day | ORAL | Status: DC

## 2013-07-03 NOTE — ED Provider Notes (Signed)
Medical screening examination/treatment/procedure(s) were conducted as a shared visit with non-physician practitioner(s) and myself.  I personally evaluated the patient during the encounter Pt with recent uti/urosepsis/kidney stone, recent stent removal. Pt w fever, uti, iv ns bolus. Iv abx.  Admit. ?possibility of persistent, infected renal stone, currently no flank or abd pain.   Suzi Roots, MD 07/03/13 [id number]

## 2013-07-03 NOTE — Discharge Summary (Signed)
Physician Discharge Summary  Grace Singh ZOX:[id number] DOB: 1966/05/28 DOA: 07/01/2013  PCP: Provider Not In System  Admit date: 07/01/2013 Discharge date: 07/03/2013  Recommendations for Outpatient Follow-up:  1. please continue taking Levaquin 750 mg daily for 5 days on discharge for urinary tract infection  2. please continue taking fluconazole 100 mg daily for 5 days on discharge for yeast infection in the urine  3. please stop taking HCTZ and lisinopril as those medications may have contributed to acute renal failure. Your creatinine is now within normal limits as we have stopped those medications. We have restarted Norvasc 5 mg daily for blood pressure control. Norvasc can sometimes cause lower extremity swelling so if you notice these side effect please contact your primary care physician for advice.  4. you may continue metformin thousand milligrams twice daily but please note that this medication too  can cause kidney failure. Since we have used this medication on the admission and creatinine has improved it is safe for you to continue this medication. Continue Januvia.  5. your potassium is 3.4 prior to discharge. Please continue taking potassium supplements 20 mEq daily for next 7 days. Then recheck with your primary care provider your potassium level and then you will be instructed whether you need to continue additional potassium supplementation.   Discharge Diagnoses:  Principal Problem:   Severe sepsis Active Problems:   UTI (urinary tract infection)   Leukocytosis, unspecified   DM2 (diabetes mellitus, type 2)   AKI (acute kidney injury)   Hypokalemia    Discharge Condition: Medically stable for discharge home today  Diet recommendation: As tolerated, diabetic and heart healthy diet  History of present illness:  47 year old female with past medical history of recurrent UTI's, anxiety and depression, diabetes who presented to [facility] 07/01/2013 with ongoing nausea,  vomiting and fevers. Patient has had bilateral ureteral stents placed in 05/2013, has had then replaced in early 06/2013 and then removed 06/28/2013. She has completed Levaquin 06/29/2013.  In ED, her BP was 112/54, HR 80 and tmax 102.6 F. Patient was found to have numerous bacteria and yeast in urine, final cultures still pending. WBC count was 17.6 and hemoglobin was 11.8. BMP revealed sodium of 132, potassium of 3.1 and creatinine 1.68.   Assessment/Plan:   Principal Problem:  Severe sepsis  - secondary to bacterial and yeast UTI  - hemodynamically stable, not requiring pressor support  - analgesia with oxycodone 2.5 mg every 6 hours PO PRN; antiemetics PRN; please note that the patient does not require pain medications at the time of discharge. - Urine culture has not been obtained at the time of the admission to guided the antibiotic management. We will continue fluconazole and Levaquin for 5 days on discharge for yeast and bacterial infection respectively Leukocytosis  - secondary to sepsis  - procalcitonin level 0.12 on admission and lactic acid WNL  - WBC count is trending down to normal limit Active Problems:  Hypokalemia  - secondary to GI losses  - repleted with total of 20 meq PO potassium then 2 doses of IV potassium 10 meq  - potassium then 2.9 so given additional 40 meq PO  - This morning potassium is 3.4 so we will continue supplementation on discharge, potassium chloride 20 mEq daily for 7 days with instructions to followup with primary care provider and recheck potassium in about one week from his discharge DM2 (diabetes mellitus, type 2)  - A1c in 05/2013 6.6  - continue metformin and  Januvia AKI (acute kidney injury)  - secondary to sepsis and UTI  Or Hctz/Lisinopril - creatinine improved with IV fluids to normal limit - Lisinopril and HCTZ as a may have been the culprit for acute renal failure Anxiety and depression  - continue Wellbutrin and citalopram    Code  Status: full code  Family Communication: no family at the bedside   Manson Passey, MD  [facility]  Pager [phone]   Consultants:  None  Procedures:  None  Antibiotics:  Patient will continue taking fluconazole and Levaquin for 5 days on discharge for urinary tract infection, bacterial and yeast respectively   Discharge Exam: Filed Vitals:   07/03/13 0701  BP: 129/65  Pulse: 75  Temp: 99.1 F (37.3 C)  Resp: 18   Filed Vitals:   07/02/13 1133 07/02/13 1411 07/02/13 2115 07/03/13 0701  BP: 115/66 124/71 111/54 129/65  Pulse:  77 80 75  Temp:  97.7 F (36.5 C) 98.4 F (36.9 C) 99.1 F (37.3 C)  TempSrc:  Oral Oral Axillary  Resp:  20 18 18   Height:      Weight:      SpO2:  97% 97% 98%    General: Pt is alert, follows commands appropriately, not in acute distress Cardiovascular: Regular rate and rhythm, S1/S2 +, no murmurs, no rubs, no gallops Respiratory: Clear to auscultation bilaterally, no wheezing, no crackles, no rhonchi Abdominal: Soft, non tender, non distended, bowel sounds +, no guarding Extremities: no edema, no cyanosis, pulses palpable bilaterally DP and PT Neuro: Grossly nonfocal  Discharge Instructions  Discharge Orders   Future Orders Complete By Expires     Call MD for:  difficulty breathing, headache or visual disturbances  As directed     Call MD for:  persistant dizziness or light-headedness  As directed     Call MD for:  persistant nausea and vomiting  As directed     Call MD for:  severe uncontrolled pain  As directed     Diet - low sodium heart healthy  As directed     Discharge instructions  As directed     Comments:      1. please continue taking Levaquin 750 mg daily for 5 days on discharge for urinary tract infection 2. please continue taking fluconazole 100 mg daily for 5 days on discharge for yeast infection in the urine 3. please stop taking HCTZ and lisinopril as those medications may have contributed to acute renal failure. Your  creatinine is now within normal limits as we have stopped those medications. We have restarted Norvasc 5 mg daily for blood pressure control. Norvasc can sometimes cause lower extremity swelling so if you notice these side effect please contact your primary care physician for advice. 4. you may continue metformin thousand milligrams twice daily but please note that this medication too  can cause kidney failure. Since we have used this medication on the admission and creatinine has improved it is safe for you to continue this medication. Continue Januvia. 5. your potassium is 3.4 prior to discharge. Please continue taking potassium supplements 20 mEq daily for next 7 days. Then recheck with your primary care provider your potassium level and then you will be instructed whether you need to continue additional potassium supplementation.    Increase activity slowly  As directed         Medication List    STOP taking these medications       hydrochlorothiazide 25 MG tablet  Commonly known as:  HYDRODIURIL     lisinopril 20 MG tablet  Commonly known as:  PRINIVIL,ZESTRIL      TAKE these medications       albuterol 108 (90 BASE) MCG/ACT inhaler  Commonly known as:  PROVENTIL HFA;VENTOLIN HFA  Inhale 2 puffs into the lungs every 6 (six) hours as needed for wheezing.     amLODipine 5 MG tablet  Commonly known as:  NORVASC  Take 1 tablet (5 mg total) by mouth daily.     atorvastatin 80 MG tablet  Commonly known as:  LIPITOR  Take 40 mg by mouth every morning.     buPROPion 75 MG tablet  Commonly known as:  WELLBUTRIN  Take 75 mg by mouth 2 (two) times daily.     citalopram 40 MG tablet  Commonly known as:  CELEXA  Take 40 mg by mouth every morning.     ferrous sulfate 325 (65 FE) MG tablet  Take 325 mg by mouth 2 (two) times daily with a meal.     fluconazole 100 MG tablet  Commonly known as:  DIFLUCAN  Take 1 tablet (100 mg total) by mouth daily.     lamoTRIgine 200 MG tablet   Commonly known as:  LAMICTAL  Take 100-200 mg by mouth 2 (two) times daily. Takes 200 mg in am and 100 mg in pm     levofloxacin 750 MG tablet  Commonly known as:  LEVAQUIN  Take 1 tablet (750 mg total) by mouth every morning.     LORazepam 0.5 MG tablet  Commonly known as:  ATIVAN  Take 0.5 mg by mouth every 8 (eight) hours as needed for anxiety.     metFORMIN 1000 MG tablet  Commonly known as:  GLUCOPHAGE  Take 1,000 mg by mouth 2 (two) times daily with a meal.     omega-3 acid ethyl esters 1 G capsule  Commonly known as:  LOVAZA  Take 1 g by mouth 2 (two) times daily.     ondansetron 4 MG tablet  Commonly known as:  ZOFRAN  Take 4 mg by mouth every 4 (four) hours as needed for nausea.     oxyCODONE-acetaminophen 7.5-325 MG per tablet  Commonly known as:  PERCOCET  Take 1 tablet by mouth every 6 (six) hours as needed for pain.     Potassium Chloride ER 20 MEQ Tbcr  Take 20 mEq by mouth 2 (two) times daily.     promethazine 12.5 MG tablet  Commonly known as:  PHENERGAN  Take 2 tablets (25 mg total) by mouth every 6 (six) hours as needed for nausea.     sitaGLIPtin 50 MG tablet  Commonly known as:  JANUVIA  Take 50 mg by mouth every morning.     traMADol-acetaminophen 37.5-325 MG per tablet  Commonly known as:  ULTRACET  Take 1 tablet by mouth every 6 (six) hours as needed for pain.     Vitamin D (Ergocalciferol) 50000 UNITS Caps  Commonly known as:  DRISDOL  Take 50,000 Units by mouth every 7 (seven) days. Sunday          The results of significant diagnostics from this hospitalization (including imaging, microbiology, ancillary and laboratory) are listed below for reference.    Significant Diagnostic Studies: Dg Chest 2 View  07/01/2013   *RADIOLOGY REPORT*  Clinical Data: Fever.  Nausea.  Vomiting.  Kidney stones.  CHEST - 2 VIEW  Comparison: 06/02/2009.  Findings:  Cardiopericardial silhouette within normal limits. Mediastinal contours normal.  Trachea  midline.  No airspace disease or effusion.  IMPRESSION: No active cardiopulmonary disease.   Original Report Authenticated By: Andreas Newport, M.D.   Dg Abd 1 View  06/21/2013   *RADIOLOGY REPORT*  Clinical Data: Evaluate stent placement  ABDOMEN - 1 VIEW  Comparison: 06/19/2013  Findings: Cholecystectomy clips are noted in the right upper quadrant of the abdomen.  There are bilateral Nephro ureteral stents in place.  The stents appear to be in satisfactory position. No ureteral calculi identified.  IMPRESSION:  1.  Satisfactory position of bilateral Nephro ureteral stents.   Original Report Authenticated By: Signa Kell, M.D.   Dg Retrograde Pyelogram  06/20/2013   *RADIOLOGY REPORT*  Clinical data:   Bilateral renal calculi.  The  INTRAOPERATIVE BILATERAL RETROGRADE UROGRAPHY  Comparison:  06/13/2013 intraoperative images as well as prior CT on 05/11/2013.  Technique:  Images were obtained with the C-arm fluoroscopic device intraoperatively and submitted for interpretation post-operatively. Please see the procedural report for the amount of contrast and the fluoroscopy time utilized.  Findings:  The left ureter was cannulated.  Contrast injection shows a normally patent ureter.  There is suggestion of filling defects centrally in the renal pelvis as well as a potential filling defect in the lower pole.  A ureteral stent was placed which shows appropriate positioning.  Right-sided contrast injection and shows opacification of the renal collecting system without significant filling defect identified.  A ureteral stent was placed which shows good positioning.  IMPRESSION: Bilateral ureteral stent placement.  Contrast injection shows central filling defect at the level of left renal pelvis as well as potentially filling defect in the lower pole collecting system of the left kidney.   Original Report Authenticated By: Irish Lack, M.D.   Dg C-arm 1-60 Min-no Report  06/13/2013   CLINICAL DATA: ureteroscopy    C-ARM 1-60 MINUTES  Fluoroscopy was utilized by the requesting physician.  No radiographic  interpretation.     Microbiology: Recent Results (from the past 240 hour(s))  CULTURE, BLOOD (ROUTINE X 2)     Status: None   Collection Time    07/01/13  9:15 PM      Result Value Range Status   Specimen Description BLOOD RIGHT FOREARM   Final   Special Requests BOTTLES DRAWN AEROBIC AND ANAEROBIC 5CC   Final   Culture  Setup Time 07/02/2013 01:48   Final   Culture     Final   Value:        BLOOD CULTURE RECEIVED NO GROWTH TO DATE CULTURE WILL BE HELD FOR 5 DAYS BEFORE ISSUING A FINAL NEGATIVE REPORT   Report Status PENDING   Incomplete  CULTURE, BLOOD (ROUTINE X 2)     Status: None   Collection Time    07/01/13  9:26 PM      Result Value Range Status   Specimen Description BLOOD RIGHT HAND   Final   Special Requests BOTTLES DRAWN AEROBIC AND ANAEROBIC 5CC   Final   Culture  Setup Time 07/02/2013 01:48   Final   Culture     Final   Value:        BLOOD CULTURE RECEIVED NO GROWTH TO DATE CULTURE WILL BE HELD FOR 5 DAYS BEFORE ISSUING A FINAL NEGATIVE REPORT   Report Status PENDING   Incomplete  URINE CULTURE     Status: None   Collection Time    07/01/13 10:39 PM      Result Value Range Status   Specimen Description  URINE, CLEAN CATCH   Final   Special Requests NONE   Final   Culture  Setup Time 07/02/2013 11:37   Final   Colony Count >=100,000 COLONIES/ML   Final   Culture YEAST   Final   Report Status 07/03/2013 FINAL   Final     Labs: Basic Metabolic Panel:  Recent Labs Lab 07/01/13 2115 07/02/13 0335 07/03/13 0755  NA 132* 133* 137  K 3.1* 2.9* 3.4*  CL 90* 95* 100  CO2 26 25 28   GLUCOSE 165* 155* 151*  BUN 23 17 7   CREATININE 1.68* 1.19* 0.82  CALCIUM 9.6 8.5 8.4   Liver Function Tests:  Recent Labs Lab 07/01/13 2115  AST 9  ALT 9  ALKPHOS 97  BILITOT 0.4  PROT 6.7  ALBUMIN 3.0*    Recent Labs Lab 07/01/13 2115  LIPASE 17   No results found for this  basename: AMMONIA,  in the last 168 hours CBC:  Recent Labs Lab 07/01/13 2115 07/02/13 0335 07/03/13 0755  WBC 17.6* 14.5* 9.4  NEUTROABS 15.0*  --   --   HGB 11.8* 9.8* 9.6*  HCT 36.4 29.8* 30.0*  MCV 80.7 81.0 82.2  PLT 319 257 236   Cardiac Enzymes: No results found for this basename: CKTOTAL, CKMB, CKMBINDEX, TROPONINI,  in the last 168 hours BNP: BNP (last 3 results) No results found for this basename: PROBNP,  in the last 8760 hours CBG:  Recent Labs Lab 07/02/13 0733 07/02/13 1101 07/02/13 1629 07/02/13 2135 07/03/13 0717  GLUCAP 119* 143* 142* 131* 117*    Time coordinating discharge: Over 30 minutes  Signed:  Manson Passey, MD  [facility]  07/03/2013, 10:25 AM  Pager #: [phone]

## 2013-07-08 LAB — CULTURE, BLOOD (ROUTINE X 2): Culture: NO GROWTH

## 2013-09-06 DIAGNOSIS — M533 Sacrococcygeal disorders, not elsewhere classified: Secondary | ICD-10-CM | POA: Insufficient documentation

## 2014-03-28 ENCOUNTER — Emergency Department (HOSPITAL_COMMUNITY): Payer: Medicare HMO

## 2014-03-28 ENCOUNTER — Encounter (HOSPITAL_COMMUNITY): Payer: Self-pay | Admitting: Emergency Medicine

## 2014-03-28 ENCOUNTER — Inpatient Hospital Stay (HOSPITAL_COMMUNITY)
Admission: EM | Admit: 2014-03-28 | Discharge: 2014-03-30 | DRG: 872 | Disposition: A | Discharge status: Home / Self Care | Payer: Medicare HMO | Attending: Internal Medicine | Admitting: Internal Medicine

## 2014-03-28 DIAGNOSIS — E1129 Type 2 diabetes mellitus with other diabetic kidney complication: Secondary | ICD-10-CM | POA: Diagnosis present

## 2014-03-28 DIAGNOSIS — IMO0001 Reserved for inherently not codable concepts without codable children: Secondary | ICD-10-CM | POA: Diagnosis present

## 2014-03-28 DIAGNOSIS — E876 Hypokalemia: Secondary | ICD-10-CM

## 2014-03-28 DIAGNOSIS — D72829 Elevated white blood cell count, unspecified: Secondary | ICD-10-CM

## 2014-03-28 DIAGNOSIS — Z794 Long term (current) use of insulin: Secondary | ICD-10-CM

## 2014-03-28 DIAGNOSIS — N183 Chronic kidney disease, stage 3 unspecified: Secondary | ICD-10-CM | POA: Diagnosis present

## 2014-03-28 DIAGNOSIS — Z87442 Personal history of urinary calculi: Secondary | ICD-10-CM

## 2014-03-28 DIAGNOSIS — IMO0002 Reserved for concepts with insufficient information to code with codable children: Secondary | ICD-10-CM | POA: Diagnosis present

## 2014-03-28 DIAGNOSIS — F418 Other specified anxiety disorders: Secondary | ICD-10-CM

## 2014-03-28 DIAGNOSIS — E860 Dehydration: Secondary | ICD-10-CM

## 2014-03-28 DIAGNOSIS — E785 Hyperlipidemia, unspecified: Secondary | ICD-10-CM | POA: Diagnosis present

## 2014-03-28 DIAGNOSIS — R809 Proteinuria, unspecified: Secondary | ICD-10-CM

## 2014-03-28 DIAGNOSIS — B373 Candidiasis of vulva and vagina: Secondary | ICD-10-CM | POA: Diagnosis present

## 2014-03-28 DIAGNOSIS — F341 Dysthymic disorder: Secondary | ICD-10-CM

## 2014-03-28 DIAGNOSIS — F3289 Other specified depressive episodes: Secondary | ICD-10-CM | POA: Diagnosis present

## 2014-03-28 DIAGNOSIS — F411 Generalized anxiety disorder: Secondary | ICD-10-CM | POA: Diagnosis present

## 2014-03-28 DIAGNOSIS — R569 Unspecified convulsions: Secondary | ICD-10-CM

## 2014-03-28 DIAGNOSIS — I129 Hypertensive chronic kidney disease with stage 1 through stage 4 chronic kidney disease, or unspecified chronic kidney disease: Secondary | ICD-10-CM | POA: Diagnosis present

## 2014-03-28 DIAGNOSIS — D509 Iron deficiency anemia, unspecified: Secondary | ICD-10-CM | POA: Diagnosis present

## 2014-03-28 DIAGNOSIS — N390 Urinary tract infection, site not specified: Secondary | ICD-10-CM

## 2014-03-28 DIAGNOSIS — N200 Calculus of kidney: Secondary | ICD-10-CM

## 2014-03-28 DIAGNOSIS — Z79899 Other long term (current) drug therapy: Secondary | ICD-10-CM

## 2014-03-28 DIAGNOSIS — E119 Type 2 diabetes mellitus without complications: Secondary | ICD-10-CM

## 2014-03-28 DIAGNOSIS — E781 Pure hyperglyceridemia: Secondary | ICD-10-CM | POA: Diagnosis present

## 2014-03-28 DIAGNOSIS — F329 Major depressive disorder, single episode, unspecified: Secondary | ICD-10-CM | POA: Diagnosis present

## 2014-03-28 DIAGNOSIS — E1165 Type 2 diabetes mellitus with hyperglycemia: Secondary | ICD-10-CM

## 2014-03-28 DIAGNOSIS — E872 Acidosis, unspecified: Secondary | ICD-10-CM

## 2014-03-28 DIAGNOSIS — N179 Acute kidney failure, unspecified: Secondary | ICD-10-CM

## 2014-03-28 DIAGNOSIS — R6520 Severe sepsis without septic shock: Secondary | ICD-10-CM

## 2014-03-28 DIAGNOSIS — R112 Nausea with vomiting, unspecified: Secondary | ICD-10-CM

## 2014-03-28 DIAGNOSIS — A419 Sepsis, unspecified organism: Principal | ICD-10-CM | POA: Diagnosis present

## 2014-03-28 DIAGNOSIS — G894 Chronic pain syndrome: Secondary | ICD-10-CM

## 2014-03-28 DIAGNOSIS — B3731 Acute candidiasis of vulva and vagina: Secondary | ICD-10-CM | POA: Diagnosis present

## 2014-03-28 LAB — URINALYSIS, ROUTINE W REFLEX MICROSCOPIC
BILIRUBIN URINE: NEGATIVE
Glucose, UA: >1000 mg/dL — AB
Ketones, ur: NEGATIVE mg/dL
NITRITE: NEGATIVE
PH: 5.5 (ref 5.0–8.0)
Protein, ur: 30 mg/dL — AB
SPECIFIC GRAVITY, URINE: 1.016 (ref 1.005–1.030)
Urobilinogen, UA: 0.2 mg/dL (ref 0.0–1.0)

## 2014-03-28 LAB — COMPREHENSIVE METABOLIC PANEL
ALT: 19 U/L (ref 0–35)
AST: 18 U/L (ref 0–37)
Albumin: 3.4 g/dL — ABNORMAL LOW (ref 3.5–5.2)
Alkaline Phosphatase: 118 U/L — ABNORMAL HIGH (ref 39–117)
BILIRUBIN TOTAL: 0.3 mg/dL (ref 0.3–1.2)
BUN: 19 mg/dL (ref 6–23)
CHLORIDE: 91 meq/L — AB (ref 96–112)
CO2: 25 mEq/L (ref 19–32)
Calcium: 11.7 mg/dL — ABNORMAL HIGH (ref 8.4–10.5)
Creatinine, Ser: 0.91 mg/dL (ref 0.50–1.10)
GFR calc Af Amer: 84 mL/min — ABNORMAL LOW (ref >90)
GFR calc non Af Amer: 72 mL/min — ABNORMAL LOW (ref >90)
Glucose, Bld: 157 mg/dL — ABNORMAL HIGH (ref 70–99)
Potassium: 4 mEq/L (ref 3.7–5.3)
Sodium: 136 mEq/L — ABNORMAL LOW (ref 137–147)
TOTAL PROTEIN: 7.5 g/dL (ref 6.0–8.3)

## 2014-03-28 LAB — CBC WITH DIFFERENTIAL/PLATELET
BASOS PCT: 1 % (ref 0–1)
Basophils Absolute: 0.1 10*3/uL (ref 0.0–0.1)
Eosinophils Absolute: 0.2 10*3/uL (ref 0.0–0.7)
Eosinophils Relative: 1 % (ref 0–5)
HCT: 44 % (ref 36.0–46.0)
Hemoglobin: 14.8 g/dL (ref 12.0–15.0)
Lymphocytes Relative: 30 % (ref 12–46)
Lymphs Abs: 3.7 10*3/uL (ref 0.7–4.0)
MCH: 29.4 pg (ref 26.0–34.0)
MCHC: 33.6 g/dL (ref 30.0–36.0)
MCV: 87.3 fL (ref 78.0–100.0)
MONO ABS: 0.7 10*3/uL (ref 0.1–1.0)
Monocytes Relative: 5 % (ref 3–12)
NEUTROS ABS: 7.8 10*3/uL — AB (ref 1.7–7.7)
NEUTROS PCT: 63 % (ref 43–77)
Platelets: 306 10*3/uL (ref 150–400)
RBC: 5.04 MIL/uL (ref 3.87–5.11)
RDW: 15.2 % (ref 11.5–15.5)
WBC: 12.5 10*3/uL — ABNORMAL HIGH (ref 4.0–10.5)

## 2014-03-28 LAB — LIPASE, BLOOD: Lipase: 29 U/L (ref 11–59)

## 2014-03-28 LAB — URINE MICROSCOPIC-ADD ON

## 2014-03-28 LAB — I-STAT CG4 LACTIC ACID, ED: LACTIC ACID, VENOUS: 4.29 mmol/L — AB (ref 0.5–2.2)

## 2014-03-28 MED ORDER — DEXTROSE 5 % IV SOLN
1.0000 g | Freq: Once | INTRAVENOUS | Status: AC
  Administered 2014-03-28: 1 g via INTRAVENOUS
  Filled 2014-03-28: qty 10

## 2014-03-28 MED ORDER — SODIUM CHLORIDE 0.9 % IV BOLUS (SEPSIS)
1000.0000 mL | Freq: Once | INTRAVENOUS | Status: AC
  Administered 2014-03-28: 1000 mL via INTRAVENOUS

## 2014-03-28 MED ORDER — HYDROMORPHONE HCL PF 1 MG/ML IJ SOLN
1.0000 mg | Freq: Once | INTRAMUSCULAR | Status: AC
  Administered 2014-03-28: 1 mg via INTRAVENOUS
  Filled 2014-03-28: qty 1

## 2014-03-28 MED ORDER — ONDANSETRON HCL 4 MG/2ML IJ SOLN
4.0000 mg | Freq: Once | INTRAMUSCULAR | Status: AC
  Administered 2014-03-28: 4 mg via INTRAVENOUS
  Filled 2014-03-28: qty 2

## 2014-03-28 MED ORDER — DEXTROSE 5 % IV SOLN
1.0000 g | Freq: Once | INTRAVENOUS | Status: AC
  Administered 2014-03-29: 1 g via INTRAVENOUS
  Filled 2014-03-28: qty 10

## 2014-03-28 MED ORDER — SODIUM CHLORIDE 0.9 % IV SOLN
INTRAVENOUS | Status: DC
Start: 2014-03-29 — End: 2014-03-29

## 2014-03-28 NOTE — ED Notes (Signed)
Dr Regenia Skeeter aware of elevated I stat Lactic.

## 2014-03-28 NOTE — H&P (Signed)
[facility] History and Physical  Grace Singh XBM:[id number] DOB: 03-19-1966 DOA: 03/28/2014  Referring physician: Dr. Regenia Skeeter PCP: PROVIDER NOT IN SYSTEM   Chief Complaint: flank pain, nausea, vomiting, subjective fever  HPI: Grace Singh is a 48 y.o. female with PMH significant for CKD (stage 2), HTN, HLD, DM type 2, anxiety, depression, chronic pain syndrome, nephrolithiasis and past hx of urosepsis (requiring ICU admission due to septic shock X 2; last in summer 2014); came to the hospital complaining of flank pain, N/V and subjective fever. Patient reports symptoms started approx 2 weeks or so ago and has been worsening since. She was started on bactrim by PCP for presumed UTI, but patient has failed treatment. She is now also having difficulty keeping anything down to N/V. In ED she was found with elevated WBC's, low grade temp, mild to moderate dehydration on PE, hypercalcemia, elevated lactic acid, tachycardic and with UA suggesting UTI.   Of note, patient denies hematemesis, CP, SOB, melena, hematochezia, hematuria or any other complaints. In ED cx's were taken, patient empirically started on rocephin, IVF's resuscitation initiated. [facility] has been called to admit patient for further evaluation and treatment of severe sepsis due to UTI.  Review of Systems:  Negative except as mentioned on HPI.  Past Medical History  Diagnosis Date  . Hypertension   . Anxiety   . Depression   . Fibromyalgia   . Hyperlipidemia   . Seasonal asthma   . Diabetes mellitus, type 2   . CKD (chronic kidney disease), stage III     In record, but baseline kidney function appears to have GFR > 90  . Wears glasses   . Arthritis     TAILBONE AREA  . Iron deficiency anemia   . Bilateral hydronephrosis   . History of sepsis     05-11-2013  W/ UROSEPSIS---  RESOLIVED  . Normal cardiac stress test     PER PT 2007  . Atrophic kidney     LEFT  . Renal calculi     bilateral   Past Surgical  History  Procedure Laterality Date  . Coccyx removal  2009  . Hysteroscopy w/d&c N/A 04/20/2013    Procedure: DILATATION AND CURETTAGE /HYSTEROSCOPY;  Surgeon: [name], MD;  Location: [facility];  Service: Gynecology;  Laterality: N/A;  . Polypectomy N/A 04/20/2013    Procedure: POLYPECTOMY;  Surgeon: [name], MD;  Location: [facility];  Service: Gynecology;  Laterality: N/A;  . Cystoscopy with stent placement Bilateral 05/11/2013    Procedure: CYSTOSCOPY WITH STENT PLACEMENT;  Surgeon: Alexis Frock, MD;  Location: [facility];  Service: Urology;  Laterality: Bilateral;  . Laparoscopic cholecystectomy  AGE 79  . Appendectomy  AGE 50  . Cystoscopy with retrograde pyelogram, ureteroscopy and stent placement Right 06/13/2013    Procedure: First Stage Bilateral Ureteroscopy;  Surgeon: Alexis Frock, MD;  Location: [facility];  Service: Urology;  Laterality: Right;  . Holmium laser application Right 0/01/7252    Procedure: HOLMIUM LASER APPLICATION, right ureteral stone;  Surgeon: Alexis Frock, MD;  Location: [facility];  Service: Urology;  Laterality: Right;  . Cystoscopy w/ ureteral stent placement Right 06/13/2013    Procedure: CYSTOSCOPY WITH STENT REPLACEMENT, right;  Surgeon: Alexis Frock, MD;  Location: [facility];  Service: Urology;  Laterality: Right;  . Cystoscopy with retrograde pyelogram, ureteroscopy and stent placement Bilateral 06/20/2013    Procedure: CYSTOSCOPY WITH RETROGRADE PYELOGRAM, URETEROSCOPY AND STENT PLACEMENT, REMOVAL BILATERAL STENTS;  Surgeon: Alexis Frock, MD;  Location: [facility];  Service: Urology;  Laterality: Bilateral;  Bilateral stent repalcement with not string  . Holmium laser application Right [id number]    Procedure: HOLMIUM LASER APPLICATION;  Surgeon: Alexis Frock, MD;  Location: [facility];  Service: Urology;  Laterality: Right;   Social History:  reports that she has never  smoked. She has never used smokeless tobacco. She reports that she does not drink alcohol or use illicit drugs.  No Known Allergies  Family Hx: significant for HTN and DM; otherwise non-contributory  Prior to Admission medications   Medication Sig Start Date End Date Taking? Authorizing Provider  albuterol (PROVENTIL HFA;VENTOLIN HFA) 108 (90 BASE) MCG/ACT inhaler Inhale 2 puffs into the lungs every 6 (six) hours as needed for wheezing.   Yes Historical Provider, MD  aspirin-acetaminophen-caffeine (EXCEDRIN MIGRAINE) [phone] MG per tablet Take 1 tablet by mouth every 6 (six) hours as needed for headache.   Yes Historical Provider, MD  aspirin-sod bicarb-citric acid (ALKA-SELTZER) 325 MG TBEF tablet Take 325 mg by mouth every 6 (six) hours as needed (indigestion).   Yes Historical Provider, MD  atorvastatin (LIPITOR) 80 MG tablet Take 80 mg by mouth every morning.    Yes Historical Provider, MD  buPROPion (WELLBUTRIN) 75 MG tablet Take 75 mg by mouth 2 (two) times daily.   Yes Historical Provider, MD  Ca Carbonate-Mag Hydroxide (ROLAIDS PO) Take 1 tablet by mouth every 4 (four) hours as needed (indigestion).   Yes Historical Provider, MD  cholecalciferol (VITAMIN D) 1000 UNITS tablet Take 5,000 Units by mouth daily.   Yes Historical Provider, MD  citalopram (CELEXA) 40 MG tablet Take 40 mg by mouth every morning.    Yes Historical Provider, MD  Dapagliflozin Propanediol (FARXIGA) 10 MG TABS Take 1 tablet by mouth daily.   Yes Historical Provider, MD  ferrous sulfate 325 (65 FE) MG tablet Take 325 mg by mouth 2 (two) times daily with a meal.   Yes Historical Provider, MD  glipiZIDE (GLUCOTROL XL) 5 MG 24 hr tablet Take 5 mg by mouth daily with breakfast.   Yes Historical Provider, MD  hydrochlorothiazide (HYDRODIURIL) 25 MG tablet Take 25 mg by mouth daily.   Yes Historical Provider, MD  ibuprofen (ADVIL,MOTRIN) 200 MG tablet Take 400 mg by mouth every 6 (six) hours as needed for moderate pain.    Yes Historical Provider, MD  lactobacillus acidophilus (BACID) TABS tablet Take 1 tablet by mouth daily.   Yes Historical Provider, MD  lamoTRIgine (LAMICTAL) 200 MG tablet Take 100-200 mg by mouth 2 (two) times daily. Takes 200 mg in am and 100 mg in pm   Yes Historical Provider, MD  levETIRAcetam (KEPPRA) 750 MG tablet Take 750 mg by mouth 2 (two) times daily.   Yes Historical Provider, MD  lisinopril (PRINIVIL,ZESTRIL) 10 MG tablet Take 10 mg by mouth 2 (two) times daily.   Yes Historical Provider, MD  LORazepam (ATIVAN) 0.5 MG tablet Take 0.5 mg by mouth every 8 (eight) hours as needed for anxiety.   Yes Historical Provider, MD  metFORMIN (GLUCOPHAGE) 500 MG tablet Take 500 mg by mouth 2 (two) times daily with a meal.   Yes Historical Provider, MD  omega-3 acid ethyl esters (LOVAZA) 1 G capsule Take 1 g by mouth 2 (two) times daily.    Yes Historical Provider, MD  oxyCODONE-acetaminophen (PERCOCET) 7.5-325 MG per tablet Take 1 tablet by mouth every 6 (six) hours as needed for pain.  06/20/13  Yes Alexis Frock, MD  promethazine (PHENERGAN) 12.5 MG tablet Take 2 tablets (25 mg total) by mouth every 6 (six) hours as needed for nausea. 06/13/13  Yes Alexis Frock, MD  sulfamethoxazole-trimethoprim (BACTRIM DS) 800-160 MG per tablet Take 1 tablet by mouth 2 (two) times daily. For 7 days.   Yes Historical Provider, MD  terbinafine (LAMISIL) 250 MG tablet Take 250 mg by mouth daily.   Yes Historical Provider, MD   Physical Exam: Filed Vitals:   03/28/14 2209  BP:   Pulse:   Temp: 98.7 F (37.1 C)  Resp:     BP 128/77  Pulse 114  Temp(Src) 98.7 F (37.1 C) (Rectal)  Resp 16  SpO2 99%  LMP 03/07/2014  General:  Appears calm and comfortable; afebrile currently; complaining of pain in her tailbone and asking for something to drink Eyes: PERRL, normal lids, irises & conjunctiva; no icterus, no nystagmus  ENT: grossly normal hearing, mild dryness of MM, no erythema or exudates inside her  mouth, good dentition. No drainage out of ears or nostrils Neck: no LAD, masses or thyromegaly, no JVD Cardiovascular: tachycardia, no rubs, gallops or murnurs. No LE edema. Respiratory: CTA bilaterally, no w/r/r. Normal respiratory effort. Abdomen: soft, nt, nd, positive BS Skin: no rash or induration seen on exam Musculoskeletal: grossly normal tone BUE/BLE, no joint swelling Psychiatric: grossly normal mood and affect, speech fluent and appropriate Neurologic: grossly non-focal.          Labs on Admission:  Basic Metabolic Panel:  Recent Labs Lab 03/28/14 2145  NA 136*  K 4.0  CL 91*  CO2 25  GLUCOSE 157*  BUN 19  CREATININE 0.91  CALCIUM 11.7*   Liver Function Tests:  Recent Labs Lab 03/28/14 2145  AST 18  ALT 19  ALKPHOS 118*  BILITOT 0.3  PROT 7.5  ALBUMIN 3.4*    Recent Labs Lab 03/28/14 2145  LIPASE 29   CBC:  Recent Labs Lab 03/28/14 2145  WBC 12.5*  NEUTROABS 7.8*  HGB 14.8  HCT 44.0  MCV 87.3  PLT 306   Radiological Exams on Admission: Ct Abdomen Pelvis Wo Contrast  03/28/2014   CLINICAL DATA:  Patient taking medication for 3 weeks for kidney infection. Chronic pain in the lower tail bone and left flank.  EXAM: CT ABDOMEN AND PELVIS WITHOUT CONTRAST  TECHNIQUE: Multidetector CT imaging of the abdomen and pelvis was performed following the standard protocol without intravenous contrast.  COMPARISON:  DG ABDOMEN 1V dated 02/04/2014; CT ABD/PELV WO CM dated 05/11/2013  FINDINGS: The lung bases are clear.  Both kidneys demonstrate lobular architecture with focal scarring. The left kidney demonstrates appearance of diffuse atrophy. There is a stone in the lower pole of the left kidney measuring about 5 mm in diameter. No ureteral stones or bladder stones are demonstrated. No pyelocaliectasis or ureterectasis on either side. The bladder wall is not thickened.  Surgical absence of the gallbladder. The unenhanced appearance of the liver, spleen, pancreas,  adrenal glands, abdominal aorta, inferior vena cava, and retroperitoneal lymph nodes is unremarkable. The stomach and small bowel are decompressed. Stool-filled colon without distention. No free air or free fluid in the abdomen. A surgical clip is present in the anterior mid abdomen.  Pelvis: Lobular appearance of the uterus consistent with fibroids. Small left ovarian cyst is likely functional. No bladder wall thickening. No free or loculated pelvic fluid collections. Appendix is not identified. No inflammatory changes in the pelvis. No pelvic mass or  lymphadenopathy. Normal alignment of the lumbar spine. No destructive bone lesions.  IMPRESSION: Nonobstructing stone in the lower pole left kidney. There appears to be bilateral renal scarring with more prominent diffuse atrophy on the left kidney. No ureteral stone or obstruction. No inflammatory process demonstrated.   Electronically Signed   By: Lucienne Capers M.D.   On: 03/28/2014 23:35    Assessment/Plan 1-Severe sepsis: secondary to UTI. -will admit to stepdown  -aggressive IVF's resuscitation -follow closely lactic acid level -check blood cx's and urine cx's -empirically tx with rocephin initiated -will use PRN analgesics and antiemetics  2-DM2 (diabetes mellitus, type 2): will hold oral hypoglycemic regimen and start SSI. -will check A1C -modified carb diet and able to tolerate it  3-UTI (urinary tract infection): urine cultures taken and pending currently -will continue IV rocephin -continue IVF's and supportive care  4-Leukocytosis, unspecified: due to urosepsis. -will continue abx;s and IVF's -repeat CBC in am to follow WBC's trend  5-Lactic acid acidosis: due to dehydration and sepsis  -will provide aggressive IVF's resuscitation and supportive care -will follow lactic acid level  6-Hypercalcemia: due to use of HCTZ, calcium supplements and dehydration most likely. -will hold HCTZ -provide IVF's -check PTH -follow calcium  level  7-Severe obesity (BMI >= 40): low calorie diet and exercise discussed with patient  8-Depression with anxiety: continue home regimen. No SI or hallucinations  9-Nausea with vomiting: most likely associated with UTI -will start PRN antiemetics -CLD initially and advance as tolerated  10-HTN: stable/soft -will hold lisinopril and HCTZ at this moment  11-CKD: stage 2 chronically. Currently stable and at baseline. -will follow renal function  12-Chronic pain syndrome: continue home pain meds  13-Convulsions/seizures: no seizure activity reported. -continue keppra and lamictal  14-HLD: continue statins and lovaza. Will check lipid panel  15-onycomycosis: continue tx with lamisil.  16-nephrolithiasis: with non-obstructive stone appreciated on her left kidney (lower part). -will need urology follow up as an outpatient   QQV:[id number]  Code Status: Full Family Communication: no family at bedside  Disposition Plan: LOS > 2 midnights, inpatient; stepdown bed  Time spent: 50 minutes  [facility] Pager [phone]

## 2014-03-28 NOTE — ED Notes (Signed)
Pt caught a urine specimen at home and brought with her. Pt says her urologist supplies her with cups to catch a sample when she can before appointments because she has such a hard time urinating. With EDP Goldston's approval, I am sending the sample to the lab. I will obtain another sample if this sample is not adequate.

## 2014-03-28 NOTE — ED Provider Notes (Addendum)
CSN: [id number]     Arrival date & time 03/28/14  2012 History   First MD Initiated Contact with Patient 03/28/14 2122     Chief Complaint  Patient presents with  . Flank Pain     (Consider location/radiation/quality/duration/timing/severity/associated sxs/prior Treatment) HPI 48 year old female presents with recurrent bilateral back pain consistent with prior kidney infections. She states that 3 weeks ago she had pain like this with vomiting and was given an antibiotic by her PCP. This seemed to clear it up. She continued having nausea and vomiting and then had a recurrence of her back pain 3 days ago. Has not had any dysuria. She felt warm when she took her temperature she did not a fever. Her pain is in hard to control and she's not able to control her nausea with her usual medicines. No abdominal pain. She's worried because this is similar but less severe than when she was in the ICU 3 times in June and July of 2014 for kidney infections an infected stone. 3 days ago she was started on Bactrim by her PCP for a recurrent kidney infection.  Past Medical History  Diagnosis Date  . Hypertension   . Anxiety   . Depression   . Fibromyalgia   . Hyperlipidemia   . Seasonal asthma   . Diabetes mellitus, type 2   . CKD (chronic kidney disease), stage III     In record, but baseline kidney function appears to have GFR > 90  . Wears glasses   . Arthritis     TAILBONE AREA  . Iron deficiency anemia   . Bilateral hydronephrosis   . History of sepsis     05-11-2013  W/ UROSEPSIS---  RESOLIVED  . Normal cardiac stress test     PER PT 2007  . Atrophic kidney     LEFT  . Renal calculi     bilateral   Past Surgical History  Procedure Laterality Date  . Coccyx removal  2009  . Hysteroscopy w/d&c N/A 04/20/2013    Procedure: DILATATION AND CURETTAGE /HYSTEROSCOPY;  Surgeon: [name], MD;  Location: [facility];  Service: Gynecology;  Laterality: N/A;  . Polypectomy N/A 04/20/2013     Procedure: POLYPECTOMY;  Surgeon: [name], MD;  Location: [facility];  Service: Gynecology;  Laterality: N/A;  . Cystoscopy with stent placement Bilateral 05/11/2013    Procedure: CYSTOSCOPY WITH STENT PLACEMENT;  Surgeon: Alexis Frock, MD;  Location: [facility];  Service: Urology;  Laterality: Bilateral;  . Laparoscopic cholecystectomy  AGE 30  . Appendectomy  AGE 41  . Cystoscopy with retrograde pyelogram, ureteroscopy and stent placement Right 06/13/2013    Procedure: First Stage Bilateral Ureteroscopy;  Surgeon: Alexis Frock, MD;  Location: [facility];  Service: Urology;  Laterality: Right;  . Holmium laser application Right 08/11/931    Procedure: HOLMIUM LASER APPLICATION, right ureteral stone;  Surgeon: Alexis Frock, MD;  Location: [facility];  Service: Urology;  Laterality: Right;  . Cystoscopy w/ ureteral stent placement Right 06/13/2013    Procedure: CYSTOSCOPY WITH STENT REPLACEMENT, right;  Surgeon: Alexis Frock, MD;  Location: [facility];  Service: Urology;  Laterality: Right;  . Cystoscopy with retrograde pyelogram, ureteroscopy and stent placement Bilateral 06/20/2013    Procedure: CYSTOSCOPY WITH RETROGRADE PYELOGRAM, URETEROSCOPY AND STENT PLACEMENT, REMOVAL BILATERAL STENTS;  Surgeon: Alexis Frock, MD;  Location: [facility];  Service: Urology;  Laterality: Bilateral;  Bilateral stent repalcement with not string  . Holmium laser  application Right 2/70/3500    Procedure: HOLMIUM LASER APPLICATION;  Surgeon: Alexis Frock, MD;  Location: [facility];  Service: Urology;  Laterality: Right;   No family history on file. History  Substance Use Topics  . Smoking status: Never Smoker   . Smokeless tobacco: Never Used  . Alcohol Use: No   OB History   Grav Para Term Preterm Abortions TAB SAB Ect Mult Living                 Review of Systems  Constitutional: Negative for fever.  Gastrointestinal:  Negative for vomiting and abdominal pain.  Genitourinary: Positive for flank pain. Negative for dysuria.  Musculoskeletal: Positive for back pain.  All other systems reviewed and are negative.     Allergies  Review of patient's allergies indicates no known allergies.  Home Medications   Prior to Admission medications   Medication Sig Start Date End Date Taking? Authorizing Provider  albuterol (PROVENTIL HFA;VENTOLIN HFA) 108 (90 BASE) MCG/ACT inhaler Inhale 2 puffs into the lungs every 6 (six) hours as needed for wheezing.   Yes Historical Provider, MD  aspirin-acetaminophen-caffeine (EXCEDRIN MIGRAINE) [phone] MG per tablet Take 1 tablet by mouth every 6 (six) hours as needed for headache.   Yes Historical Provider, MD  aspirin-sod bicarb-citric acid (ALKA-SELTZER) 325 MG TBEF tablet Take 325 mg by mouth every 6 (six) hours as needed (indigestion).   Yes Historical Provider, MD  atorvastatin (LIPITOR) 80 MG tablet Take 80 mg by mouth every morning.    Yes Historical Provider, MD  buPROPion (WELLBUTRIN) 75 MG tablet Take 75 mg by mouth 2 (two) times daily.   Yes Historical Provider, MD  Ca Carbonate-Mag Hydroxide (ROLAIDS PO) Take 1 tablet by mouth every 4 (four) hours as needed (indigestion).   Yes Historical Provider, MD  cholecalciferol (VITAMIN D) 1000 UNITS tablet Take 5,000 Units by mouth daily.   Yes Historical Provider, MD  citalopram (CELEXA) 40 MG tablet Take 40 mg by mouth every morning.    Yes Historical Provider, MD  Dapagliflozin Propanediol (FARXIGA) 10 MG TABS Take 1 tablet by mouth daily.   Yes Historical Provider, MD  ferrous sulfate 325 (65 FE) MG tablet Take 325 mg by mouth 2 (two) times daily with a meal.   Yes Historical Provider, MD  glipiZIDE (GLUCOTROL XL) 5 MG 24 hr tablet Take 5 mg by mouth daily with breakfast.   Yes Historical Provider, MD  hydrochlorothiazide (HYDRODIURIL) 25 MG tablet Take 25 mg by mouth daily.   Yes Historical Provider, MD  ibuprofen  (ADVIL,MOTRIN) 200 MG tablet Take 400 mg by mouth every 6 (six) hours as needed for moderate pain.   Yes Historical Provider, MD  lactobacillus acidophilus (BACID) TABS tablet Take 1 tablet by mouth daily.   Yes Historical Provider, MD  lamoTRIgine (LAMICTAL) 200 MG tablet Take 100-200 mg by mouth 2 (two) times daily. Takes 200 mg in am and 100 mg in pm   Yes Historical Provider, MD  levETIRAcetam (KEPPRA) 750 MG tablet Take 750 mg by mouth 2 (two) times daily.   Yes Historical Provider, MD  lisinopril (PRINIVIL,ZESTRIL) 10 MG tablet Take 10 mg by mouth 2 (two) times daily.   Yes Historical Provider, MD  LORazepam (ATIVAN) 0.5 MG tablet Take 0.5 mg by mouth every 8 (eight) hours as needed for anxiety.   Yes Historical Provider, MD  metFORMIN (GLUCOPHAGE) 500 MG tablet Take 500 mg by mouth 2 (two) times daily with a meal.  Yes Historical Provider, MD  omega-3 acid ethyl esters (LOVAZA) 1 G capsule Take 1 g by mouth 2 (two) times daily.    Yes Historical Provider, MD  oxyCODONE-acetaminophen (PERCOCET) 7.5-325 MG per tablet Take 1 tablet by mouth every 6 (six) hours as needed for pain. 06/20/13  Yes Alexis Frock, MD  promethazine (PHENERGAN) 12.5 MG tablet Take 2 tablets (25 mg total) by mouth every 6 (six) hours as needed for nausea. 06/13/13  Yes Alexis Frock, MD  sulfamethoxazole-trimethoprim (BACTRIM DS) 800-160 MG per tablet Take 1 tablet by mouth 2 (two) times daily. For 7 days.   Yes Historical Provider, MD  terbinafine (LAMISIL) 250 MG tablet Take 250 mg by mouth daily.   Yes Historical Provider, MD   BP 128/77  Pulse 114  Temp(Src) 99 F (37.2 C) (Oral)  Resp 16  SpO2 99%  LMP 03/07/2014 Physical Exam  Nursing note and vitals reviewed. Constitutional: She is oriented to person, place, and time. She appears well-developed and well-nourished. No distress.  HENT:  Head: Normocephalic and atraumatic.  Right Ear: External ear normal.  Left Ear: External ear normal.  Nose: Nose normal.   Eyes: Right eye exhibits no discharge. Left eye exhibits no discharge.  Cardiovascular: Regular rhythm and normal heart sounds.  Tachycardia present.   Pulmonary/Chest: Effort normal and breath sounds normal.  Abdominal: Soft. There is no tenderness. There is CVA tenderness.  Neurological: She is alert and oriented to person, place, and time.  Skin: Skin is warm and dry.    ED Course  Procedures (including critical care time) Labs Review Labs Reviewed  CBC WITH DIFFERENTIAL - Abnormal; Notable for the following:    WBC 12.5 (*)    Neutro Abs 7.8 (*)    All other components within normal limits  COMPREHENSIVE METABOLIC PANEL - Abnormal; Notable for the following:    Sodium 136 (*)    Chloride 91 (*)    Glucose, Bld 157 (*)    Calcium 11.7 (*)    Albumin 3.4 (*)    Alkaline Phosphatase 118 (*)    GFR calc non Af Amer 72 (*)    GFR calc Af Amer 84 (*)    All other components within normal limits  URINALYSIS, ROUTINE W REFLEX MICROSCOPIC - Abnormal; Notable for the following:    APPearance TURBID (*)    Glucose, UA >1000 (*)    Hgb urine dipstick SMALL (*)    Protein, ur 30 (*)    Leukocytes, UA LARGE (*)    All other components within normal limits  URINE MICROSCOPIC-ADD ON - Abnormal; Notable for the following:    Bacteria, UA FEW (*)    All other components within normal limits  I-STAT CG4 LACTIC ACID, ED - Abnormal; Notable for the following:    Lactic Acid, Venous 4.29 (*)    All other components within normal limits  URINE CULTURE  CULTURE, BLOOD (ROUTINE X 2)  CULTURE, BLOOD (ROUTINE X 2)  LIPASE, BLOOD    Imaging Review Ct Abdomen Pelvis Wo Contrast  03/28/2014   CLINICAL DATA:  Patient taking medication for 3 weeks for kidney infection. Chronic pain in the lower tail bone and left flank.  EXAM: CT ABDOMEN AND PELVIS WITHOUT CONTRAST  TECHNIQUE: Multidetector CT imaging of the abdomen and pelvis was performed following the standard protocol without intravenous  contrast.  COMPARISON:  DG ABDOMEN 1V dated 02/04/2014; CT ABD/PELV WO CM dated 05/11/2013  FINDINGS: The lung bases are clear.  Both kidneys  demonstrate lobular architecture with focal scarring. The left kidney demonstrates appearance of diffuse atrophy. There is a stone in the lower pole of the left kidney measuring about 5 mm in diameter. No ureteral stones or bladder stones are demonstrated. No pyelocaliectasis or ureterectasis on either side. The bladder wall is not thickened.  Surgical absence of the gallbladder. The unenhanced appearance of the liver, spleen, pancreas, adrenal glands, abdominal aorta, inferior vena cava, and retroperitoneal lymph nodes is unremarkable. The stomach and small bowel are decompressed. Stool-filled colon without distention. No free air or free fluid in the abdomen. A surgical clip is present in the anterior mid abdomen.  Pelvis: Lobular appearance of the uterus consistent with fibroids. Small left ovarian cyst is likely functional. No bladder wall thickening. No free or loculated pelvic fluid collections. Appendix is not identified. No inflammatory changes in the pelvis. No pelvic mass or lymphadenopathy. Normal alignment of the lumbar spine. No destructive bone lesions.  IMPRESSION: Nonobstructing stone in the lower pole left kidney. There appears to be bilateral renal scarring with more prominent diffuse atrophy on the left kidney. No ureteral stone or obstruction. No inflammatory process demonstrated.   Electronically Signed   By: Lucienne Capers M.D.   On: 03/28/2014 23:35     EKG Interpretation None      MDM   Final diagnoses:  Severe sepsis  Sepsis secondary to UTI    48 year old female with recurrent kidney infections and infected stones. Here her lactate is elevated to 4 and there is concern for urosepsis. CT scan was evaluated due to her significant kidney stone history and multiple kidney stone procedures. CT shows a nonobstructing left kidney stone but no  ureteral blockage. No new hydronephrosis. At this point will treat with IV Rocephin, fluids, will need admission for sepsis. Patient's blood pressure remained stable in the ER.  Will admit to stepdown    Ephraim Hamburger, MD 03/28/14 4193  Ephraim Hamburger, MD 04/10/14 0700

## 2014-03-28 NOTE — ED Notes (Signed)
Pt reports having a kidney infection, which she was diagnosed with 3 weeks ago by her PCP and was prescribed Bactrim and phenergan, which she reports taking as prescribed. Pt reports chronic pain to the lower tailbone. Pt reports taking her pain medication while waiting for triage. Pt is A/O x4.

## 2014-03-29 ENCOUNTER — Encounter (HOSPITAL_COMMUNITY): Payer: Self-pay | Admitting: Internal Medicine

## 2014-03-29 DIAGNOSIS — E872 Acidosis, unspecified: Secondary | ICD-10-CM | POA: Diagnosis present

## 2014-03-29 DIAGNOSIS — F418 Other specified anxiety disorders: Secondary | ICD-10-CM

## 2014-03-29 DIAGNOSIS — R112 Nausea with vomiting, unspecified: Secondary | ICD-10-CM | POA: Diagnosis present

## 2014-03-29 DIAGNOSIS — G894 Chronic pain syndrome: Secondary | ICD-10-CM | POA: Diagnosis present

## 2014-03-29 DIAGNOSIS — R569 Unspecified convulsions: Secondary | ICD-10-CM | POA: Diagnosis present

## 2014-03-29 DIAGNOSIS — E860 Dehydration: Secondary | ICD-10-CM | POA: Diagnosis present

## 2014-03-29 DIAGNOSIS — E876 Hypokalemia: Secondary | ICD-10-CM

## 2014-03-29 LAB — CBC
HCT: 36.8 % (ref 36.0–46.0)
HEMOGLOBIN: 12.2 g/dL (ref 12.0–15.0)
MCH: 28.7 pg (ref 26.0–34.0)
MCHC: 33.2 g/dL (ref 30.0–36.0)
MCV: 86.6 fL (ref 78.0–100.0)
Platelets: 261 10*3/uL (ref 150–400)
RBC: 4.25 MIL/uL (ref 3.87–5.11)
RDW: 15.1 % (ref 11.5–15.5)
WBC: 8.3 10*3/uL (ref 4.0–10.5)

## 2014-03-29 LAB — BASIC METABOLIC PANEL
BUN: 16 mg/dL (ref 6–23)
CHLORIDE: 98 meq/L (ref 96–112)
CO2: 26 mEq/L (ref 19–32)
Calcium: 9.1 mg/dL (ref 8.4–10.5)
Creatinine, Ser: 0.88 mg/dL (ref 0.50–1.10)
GFR calc Af Amer: 87 mL/min — ABNORMAL LOW (ref >90)
GFR calc non Af Amer: 75 mL/min — ABNORMAL LOW (ref >90)
Glucose, Bld: 115 mg/dL — ABNORMAL HIGH (ref 70–99)
POTASSIUM: 3.6 meq/L — AB (ref 3.7–5.3)
Sodium: 136 mEq/L — ABNORMAL LOW (ref 137–147)

## 2014-03-29 LAB — GLUCOSE, CAPILLARY
GLUCOSE-CAPILLARY: 176 mg/dL — AB (ref 70–99)
GLUCOSE-CAPILLARY: 147 mg/dL — AB (ref 70–99)
GLUCOSE-CAPILLARY: 235 mg/dL — AB (ref 70–99)
Glucose-Capillary: 108 mg/dL — ABNORMAL HIGH (ref 70–99)
Glucose-Capillary: 123 mg/dL — ABNORMAL HIGH (ref 70–99)

## 2014-03-29 LAB — LACTIC ACID, PLASMA: Lactic Acid, Venous: 1.9 mmol/L (ref 0.5–2.2)

## 2014-03-29 LAB — LIPID PANEL
CHOLESTEROL: 262 mg/dL — AB (ref 0–200)
LDL Cholesterol: UNDETERMINED mg/dL (ref 0–99)
TRIGLYCERIDES: 1631 mg/dL — AB (ref <150)
VLDL: UNDETERMINED mg/dL (ref 0–40)

## 2014-03-29 LAB — MRSA PCR SCREENING: MRSA BY PCR: NEGATIVE

## 2014-03-29 MED ORDER — SODIUM CHLORIDE 0.9 % IV SOLN
INTRAVENOUS | Status: AC
  Administered 2014-03-29 (×2): via INTRAVENOUS

## 2014-03-29 MED ORDER — DAPAGLIFLOZIN PROPANEDIOL 10 MG PO TABS: 1.0000 | ORAL_TABLET | Freq: Every day | ORAL | Status: DC

## 2014-03-29 MED ORDER — ONDANSETRON HCL 4 MG/2ML IJ SOLN: 4.0000 mg | Freq: Four times a day (QID) | INTRAMUSCULAR | Status: DC | PRN

## 2014-03-29 MED ORDER — BUPROPION HCL 75 MG PO TABS
75.0000 mg | ORAL_TABLET | Freq: Two times a day (BID) | ORAL | Status: DC
  Administered 2014-03-29 – 2014-03-30 (×4): 75 mg via ORAL
  Filled 2014-03-29 (×5): qty 1

## 2014-03-29 MED ORDER — TERBINAFINE HCL 250 MG PO TABS
250.0000 mg | ORAL_TABLET | Freq: Every day | ORAL | Status: DC
  Administered 2014-03-29 – 2014-03-30 (×2): 250 mg via ORAL
  Filled 2014-03-29 (×2): qty 1

## 2014-03-29 MED ORDER — MORPHINE SULFATE 2 MG/ML IJ SOLN: 2.0000 mg | INTRAMUSCULAR | Status: DC | PRN

## 2014-03-29 MED ORDER — ALUM & MAG HYDROXIDE-SIMETH 200-200-20 MG/5ML PO SUSP
15.0000 mL | ORAL | Status: DC | PRN
  Administered 2014-03-29 (×2): 15 mL via ORAL
  Filled 2014-03-29: qty 30

## 2014-03-29 MED ORDER — LORAZEPAM 0.5 MG PO TABS: 0.5000 mg | ORAL_TABLET | Freq: Three times a day (TID) | ORAL | Status: DC | PRN

## 2014-03-29 MED ORDER — HEPARIN SODIUM (PORCINE) 5000 UNIT/ML IJ SOLN
5000.0000 [IU] | Freq: Three times a day (TID) | INTRAMUSCULAR | Status: DC
  Administered 2014-03-29 – 2014-03-30 (×4): 5000 [IU] via SUBCUTANEOUS
  Filled 2014-03-29 (×7): qty 1

## 2014-03-29 MED ORDER — ACETAMINOPHEN 325 MG PO TABS: 650.0000 mg | ORAL_TABLET | Freq: Four times a day (QID) | ORAL | Status: DC | PRN

## 2014-03-29 MED ORDER — ONDANSETRON HCL 4 MG PO TABS: 4.0000 mg | ORAL_TABLET | Freq: Four times a day (QID) | ORAL | Status: DC | PRN

## 2014-03-29 MED ORDER — LAMOTRIGINE 100 MG PO TABS
100.0000 mg | ORAL_TABLET | Freq: Every day | ORAL | Status: DC
  Administered 2014-03-29 (×2): 100 mg via ORAL
  Filled 2014-03-29 (×3): qty 1

## 2014-03-29 MED ORDER — LAMOTRIGINE 200 MG PO TABS
200.0000 mg | ORAL_TABLET | Freq: Every day | ORAL | Status: DC
  Administered 2014-03-29 – 2014-03-30 (×2): 200 mg via ORAL
  Filled 2014-03-29 (×2): qty 1

## 2014-03-29 MED ORDER — OMEGA-3-ACID ETHYL ESTERS 1 G PO CAPS
1.0000 g | ORAL_CAPSULE | Freq: Two times a day (BID) | ORAL | Status: DC
  Administered 2014-03-29 – 2014-03-30 (×4): 1 g via ORAL
  Filled 2014-03-29 (×5): qty 1

## 2014-03-29 MED ORDER — ACETAMINOPHEN 650 MG RE SUPP: 650.0000 mg | Freq: Four times a day (QID) | RECTAL | Status: DC | PRN

## 2014-03-29 MED ORDER — LEVETIRACETAM 750 MG PO TABS
750.0000 mg | ORAL_TABLET | Freq: Two times a day (BID) | ORAL | Status: DC
  Administered 2014-03-29 – 2014-03-30 (×4): 750 mg via ORAL
  Filled 2014-03-29 (×5): qty 1

## 2014-03-29 MED ORDER — DEXTROSE 5 % IV SOLN
2.0000 g | INTRAVENOUS | Status: DC
  Administered 2014-03-29: 2 g via INTRAVENOUS
  Filled 2014-03-29 (×3): qty 2

## 2014-03-29 MED ORDER — ALBUTEROL SULFATE (2.5 MG/3ML) 0.083% IN NEBU: 3.0000 mL | INHALATION_SOLUTION | Freq: Four times a day (QID) | RESPIRATORY_TRACT | Status: DC | PRN

## 2014-03-29 MED ORDER — INSULIN ASPART 100 UNIT/ML ~~LOC~~ SOLN
0.0000 [IU] | Freq: Three times a day (TID) | SUBCUTANEOUS | Status: DC
  Administered 2014-03-29 (×2): 1 [IU] via SUBCUTANEOUS
  Administered 2014-03-29 – 2014-03-30 (×3): 2 [IU] via SUBCUTANEOUS

## 2014-03-29 MED ORDER — ATORVASTATIN CALCIUM 80 MG PO TABS
80.0000 mg | ORAL_TABLET | Freq: Every morning | ORAL | Status: DC
  Administered 2014-03-29: 80 mg via ORAL
  Filled 2014-03-29 (×2): qty 1

## 2014-03-29 MED ORDER — OXYCODONE-ACETAMINOPHEN 7.5-325 MG PO TABS: 1.0000 | ORAL_TABLET | Freq: Four times a day (QID) | ORAL | Status: DC | PRN

## 2014-03-29 MED ORDER — POTASSIUM CHLORIDE CRYS ER 20 MEQ PO TBCR
40.0000 meq | EXTENDED_RELEASE_TABLET | Freq: Every day | ORAL | Status: AC
  Administered 2014-03-29 – 2014-03-30 (×2): 40 meq via ORAL
  Filled 2014-03-29 (×2): qty 2

## 2014-03-29 MED ORDER — CITALOPRAM HYDROBROMIDE 40 MG PO TABS
40.0000 mg | ORAL_TABLET | Freq: Every morning | ORAL | Status: DC
  Administered 2014-03-29 – 2014-03-30 (×2): 40 mg via ORAL
  Filled 2014-03-29 (×2): qty 1

## 2014-03-29 MED ORDER — RISAQUAD PO CAPS
1.0000 | ORAL_CAPSULE | Freq: Every day | ORAL | Status: DC
  Administered 2014-03-29 – 2014-03-30 (×2): 1 via ORAL
  Filled 2014-03-29 (×2): qty 1

## 2014-03-29 MED ORDER — OXYCODONE HCL 5 MG PO TABS
2.5000 mg | ORAL_TABLET | Freq: Four times a day (QID) | ORAL | Status: DC | PRN
  Administered 2014-03-29 – 2014-03-30 (×4): 2.5 mg via ORAL
  Filled 2014-03-29 (×4): qty 1

## 2014-03-29 MED ORDER — OXYCODONE-ACETAMINOPHEN 5-325 MG PO TABS
1.0000 | ORAL_TABLET | Freq: Four times a day (QID) | ORAL | Status: DC | PRN
  Administered 2014-03-29 – 2014-03-30 (×4): 1 via ORAL
  Filled 2014-03-29 (×4): qty 1

## 2014-03-29 MED ORDER — INSULIN ASPART 100 UNIT/ML ~~LOC~~ SOLN
0.0000 [IU] | Freq: Every day | SUBCUTANEOUS | Status: DC
  Administered 2014-03-29: 2 [IU] via SUBCUTANEOUS

## 2014-03-29 NOTE — Progress Notes (Signed)
[facility] Progress Note   Grace Singh:[id number] DOB: [birth year] DOA: 03/28/2014 PCP: PROVIDER NOT IN SYSTEM  Brief narrative: Grace Singh is a 48 y.o. female presenting on 03/28/2014 with  with PMH significant for CKD (stage 2), HTN, HLD, DM type 2, anxiety, depression, chronic pain syndrome, nephrolithiasis and past hx of urosepsis (requiring ICU admission due to septic shock X 2; last in summer 2014); came to the hospital complaining of flank pain, N/V and subjective fever. Patient reports symptoms started approx 2 weeks or so ago and has been worsening since. She was started on bactrim by PCP for presumed UTI, but patient has failed treatment. She is now also having difficulty keeping anything down to N/V. In ED she was found with elevated WBC's, low grade temp, mild to moderate dehydration on PE, hypercalcemia, elevated lactic acid, tachycardic and with UA suggesting UTI.   Subjective: Nausea seems to be improving. Would like to try to advance diet. Flank pain has now resolved. No dysuria. Overall feels significantly better.   Assessment/Plan: Principal Problem:   Severe sepsis due to UTI - f/u blood and urine cultures - improving with Rocephin therefore will continue this - transfer out of SDU  Active Problems:   DM2  - last A1c per pt was 7. Will follow on sliding scale for now as she is only on liquids.     Lactic acid acidosis - resolved    Hypercalcemia - normalized after hydration - suspect mostly related to dehydration - Intact PTH pending     Severe obesity (BMI >= 40)    Depression with anxiety Cont home meds    Dehydration - cont IVF through today    Nausea with vomiting - improving with treatment of UTI- she states this is the usual symptom for her when she has a UTI - PRN Zofran - advance diet slowly  Chronic hypertriglyceridemia -cont statin  Hypokalemia - replace and recheck in AM   Code Status: Full code Family Communication:  none Disposition Plan: home in next couple of days  Consultants: none  Procedures: none  Antibiotics: Anti-infectives   Start     Dose/Rate Route Frequency Ordered Stop   03/29/14 2200  cefTRIAXone (ROCEPHIN) 2 g in dextrose 5 % 50 mL IVPB     2 g 100 mL/hr over 30 Minutes Intravenous Every 24 hours 03/29/14 0858     03/29/14 1000  terbinafine (LAMISIL) tablet 250 mg     250 mg Oral Daily 03/29/14 0144     03/29/14 0000  cefTRIAXone (ROCEPHIN) 1 g in dextrose 5 % 50 mL IVPB     1 g 100 mL/hr over 30 Minutes Intravenous  Once 03/28/14 2354 03/29/14 0035   03/28/14 2315  cefTRIAXone (ROCEPHIN) 1 g in dextrose 5 % 50 mL IVPB     1 g 100 mL/hr over 30 Minutes Intravenous  Once 03/28/14 2305 03/28/14 2358       DVT prophylaxis: Heparin  Objective: Filed Weights   03/29/14 0113  Weight: 129.9 kg (286 lb 6 oz)   Blood pressure 113/56, pulse 88, temperature 98.5 F (36.9 C), temperature source Oral, resp. rate 18, height 5\' 5"  (1.651 m), weight 129.9 kg (286 lb 6 oz), last menstrual period 03/07/2014, SpO2 93.00%.  Intake/Output Summary (Last 24 hours) at 03/29/14 1031 Last data filed at 03/29/14 0800  Gross per 24 hour  Intake 935.83 ml  Output    300 ml  Net 635.83 ml     Exam:  General: No acute respiratory distress Lungs: Clear to auscultation bilaterally without wheezes or crackles Cardiovascular: Regular rate and rhythm without murmur gallop or rub normal S1 and S2 Abdomen: Nontender, nondistended, soft, bowel sounds positive, no rebound, no ascites, no appreciable mass Extremities: No significant cyanosis, clubbing, or edema bilateral lower extremities  Data Reviewed: Basic Metabolic Panel:  Recent Labs Lab 03/28/14 2145 03/29/14 0230  NA 136* 136*  K 4.0 3.6*  CL 91* 98  CO2 25 26  GLUCOSE 157* 115*  BUN 19 16  CREATININE 0.91 0.88  CALCIUM 11.7* 9.1   Liver Function Tests:  Recent Labs Lab 03/28/14 2145  AST 18  ALT 19  ALKPHOS 118*   BILITOT 0.3  PROT 7.5  ALBUMIN 3.4*    Recent Labs Lab 03/28/14 2145  LIPASE 29   No results found for this basename: AMMONIA,  in the last 168 hours CBC:  Recent Labs Lab 03/28/14 2145 03/29/14 0230  WBC 12.5* 8.3  NEUTROABS 7.8*  --   HGB 14.8 12.2  HCT 44.0 36.8  MCV 87.3 86.6  PLT 306 261   Cardiac Enzymes: No results found for this basename: CKTOTAL, CKMB, CKMBINDEX, TROPONINI,  in the last 168 hours BNP (last 3 results) No results found for this basename: PROBNP,  in the last 8760 hours CBG:  Recent Labs Lab 03/29/14 0231  GLUCAP 108*    Recent Results (from the past 240 hour(s))  MRSA PCR SCREENING     Status: None   Collection Time    03/29/14  1:32 AM      Result Value Ref Range Status   MRSA by PCR NEGATIVE  NEGATIVE Final   Comment:            The GeneXpert MRSA Assay (FDA     approved for NASAL specimens     only), is one component of a     comprehensive MRSA colonization     surveillance program. It is not     intended to diagnose MRSA     infection nor to guide or     monitor treatment for     MRSA infections.     Studies:  Recent x-ray studies have been reviewed in detail by the Attending Physician  Scheduled Meds:  Scheduled Meds: . acidophilus  1 capsule Oral Daily  . atorvastatin  80 mg Oral q morning - 10a  . buPROPion  75 mg Oral BID  . cefTRIAXone (ROCEPHIN)  IV  2 g Intravenous Q24H  . citalopram  40 mg Oral q morning - 10a  . Dapagliflozin Propanediol  1 tablet Oral Daily  . heparin  5,000 Units Subcutaneous 3 times per day  . insulin aspart  0-5 Units Subcutaneous QHS  . insulin aspart  0-9 Units Subcutaneous TID WC  . lamoTRIgine  100 mg Oral QHS  . lamoTRIgine  200 mg Oral Daily  . levETIRAcetam  750 mg Oral BID  . omega-3 acid ethyl esters  1 g Oral BID  . potassium chloride  40 mEq Oral Daily  . terbinafine  250 mg Oral Daily  . [DISCONTINUED] sodium chloride   Intravenous STAT   Continuous Infusions: .  sodium chloride 125 mL/hr at 03/29/14 1020    Time spent on care of this patient: 35 min   Debbe Odea, MD 03/29/2014, 10:31 AM  LOS: 1 day   [facility] Office  [phone] Pager - Text Page per Shea Evans   If 7PM-7AM, please contact night-coverage [email or web address]

## 2014-03-30 LAB — URINE CULTURE

## 2014-03-30 LAB — BASIC METABOLIC PANEL
BUN: 10 mg/dL (ref 6–23)
CALCIUM: 8.2 mg/dL — AB (ref 8.4–10.5)
CO2: 27 mEq/L (ref 19–32)
Chloride: 102 mEq/L (ref 96–112)
Creatinine, Ser: 0.77 mg/dL (ref 0.50–1.10)
GFR calc Af Amer: >90 mL/min (ref >90)
GFR calc non Af Amer: >90 mL/min (ref >90)
Glucose, Bld: 144 mg/dL — ABNORMAL HIGH (ref 70–99)
POTASSIUM: 4.3 meq/L (ref 3.7–5.3)
SODIUM: 139 meq/L (ref 137–147)

## 2014-03-30 LAB — GLUCOSE, CAPILLARY
GLUCOSE-CAPILLARY: 161 mg/dL — AB (ref 70–99)
GLUCOSE-CAPILLARY: 186 mg/dL — AB (ref 70–99)

## 2014-03-30 MED ORDER — FLUCONAZOLE 100 MG PO TABS: 100.0000 mg | ORAL_TABLET | Freq: Every day | ORAL | Status: DC

## 2014-03-30 MED ORDER — CEFUROXIME AXETIL 500 MG PO TABS
500.0000 mg | ORAL_TABLET | Freq: Two times a day (BID) | ORAL | Status: DC
  Filled 2014-03-30 (×2): qty 1

## 2014-03-30 MED ORDER — GLIPIZIDE ER 5 MG PO TB24
5.0000 mg | ORAL_TABLET | Freq: Every day | ORAL | Status: DC
  Administered 2014-03-30: 5 mg via ORAL
  Filled 2014-03-30 (×2): qty 1

## 2014-03-30 MED ORDER — CEFUROXIME AXETIL 500 MG PO TABS: 500.0000 mg | ORAL_TABLET | Freq: Two times a day (BID) | ORAL | Status: DC

## 2014-03-30 MED ORDER — FLUCONAZOLE IN SODIUM CHLORIDE 200-0.9 MG/100ML-% IV SOLN
200.0000 mg | Freq: Every day | INTRAVENOUS | Status: DC
  Administered 2014-03-30: 200 mg via INTRAVENOUS
  Filled 2014-03-30: qty 100

## 2014-03-30 NOTE — Discharge Summary (Signed)
Physician Discharge Summary  Grace Singh YKD:[id number] DOB: August 10, 1966 DOA: 03/28/2014  PCP: PROVIDER NOT IN SYSTEM  Admit date: 03/28/2014 Discharge date: 03/30/2014  Time spent: >45 minutes  Recommendations for Outpatient Follow-up:  1. Has been advised to see her PCP on Monday (2 days)  Discharge Diagnoses:  Principal Problem:   Severe sepsis Active Problems:   DM2 (diabetes mellitus, type 2)   UTI (urinary tract infection)   Leukocytosis, unspecified   Lactic acid acidosis   Hypercalcemia   Severe obesity (BMI >= 40)   Depression with anxiety   Dehydration   Nausea with vomiting   Chronic pain syndrome   Convulsions/seizures   Discharge Condition: stable  Diet recommendation: heart healthy, diabetic diet  Filed Weights   03/29/14 0113 03/29/14 1912  Weight: 129.9 kg (286 lb 6 oz) 131.226 kg (289 lb 4.8 oz)    History of present illness:  Grace Singh is a 48 y.o. female presenting on 03/28/2014 with with PMH significant for CKD (stage 2), HTN, HLD, DM type 2, anxiety, depression, chronic pain syndrome, nephrolithiasis and past hx of urosepsis (requiring ICU admission due to septic shock X 2; last in summer 2014); came to the hospital complaining of flank pain, N/V and subjective fever. Patient reports symptoms started approx 2 weeks or so ago and has been worsening since. She was started on bactrim by PCP for presumed UTI, but patient has failed treatment. She is now also having difficulty keeping anything down to N/V. In ED she was found with elevated WBC's, low grade temp, mild to moderate dehydration, hypercalcemia, elevated lactic acid, tachycardic and with UA suggesting UTI.    Hospital Course:  Severe sepsis due to UTI  -  urine cultures growing > 100,000 colonies of yeast but patient also states he has a vaginal yeast infection for which her NP was giving her Diflucan - will continue Diflucan for a total of 7 days in case she does have an actual fungal  UTI - I suspect that the yeast are obscuring and underlying bacterial infection and therefore would like to treat for a total of 7 days with an antibiotic. Since she has improved in the hospital with Rocephin, will continue a cephalosporin  Active Problems:   DM2  - resume home meds - we have discussed the tendency of Farxiga in causing UTIs- I have told her to continue it for now and if she notes frequent UTIs with it, she may have to discontinue it.   Lactic acid acidosis  - resolved   Hypercalcemia  - normalized after hydration  - suspect mostly related to dehydration  - Intact PTH pending   Severe obesity (BMI >= 40)   Depression with anxiety  Cont home meds   Dehydration  - resolved with aggressive hydration  Nausea with vomiting  - improving with treatment of UTI- she states this is the usual symptom for her when she has a UTI  - PRN Zofran  - advance diet slowly   Chronic hypertriglyceridemia  -cont statin   Hypokalemia  - replaced  Procedures:  none  Consultations:  none  Discharge Exam: Filed Vitals:   03/30/14 0544  BP: 120/72  Pulse: 77  Temp: 97.7 F (36.5 C)  Resp: 18    General: AAO x 3, no distress Cardiovascular: RRR, no murmurs Respiratory: CTA b/l   Discharge Instructions You were cared for by a hospitalist during your hospital stay. If you have any questions about your discharge medications or  the care you received while you were in the hospital after you are discharged, you can call the unit and asked to speak with the hospitalist on call if the hospitalist that took care of you is not available. Once you are discharged, your primary care physician will handle any further medical issues. Please note that NO REFILLS for any discharge medications will be authorized once you are discharged, as it is imperative that you return to your primary care physician (or establish a relationship with a primary care physician if you do not have one) for  your aftercare needs so that they can reassess your need for medications and monitor your lab values.      Discharge Orders   Future Orders Complete By Expires   Diet - low sodium heart healthy  As directed    Increase activity slowly  As directed        Medication List    STOP taking these medications       sulfamethoxazole-trimethoprim 800-160 MG per tablet  Commonly known as:  BACTRIM DS      TAKE these medications       albuterol 108 (90 BASE) MCG/ACT inhaler  Commonly known as:  PROVENTIL HFA;VENTOLIN HFA  Inhale 2 puffs into the lungs every 6 (six) hours as needed for wheezing.     aspirin-acetaminophen-caffeine 917-915-05 MG per tablet  Commonly known as:  EXCEDRIN MIGRAINE  Take 1 tablet by mouth every 6 (six) hours as needed for headache.     aspirin-sod bicarb-citric acid 325 MG Tbef tablet  Commonly known as:  ALKA-SELTZER  Take 325 mg by mouth every 6 (six) hours as needed (indigestion).     atorvastatin 80 MG tablet  Commonly known as:  LIPITOR  Take 80 mg by mouth every morning.     buPROPion 75 MG tablet  Commonly known as:  WELLBUTRIN  Take 75 mg by mouth 2 (two) times daily.     cefUROXime 500 MG tablet  Commonly known as:  CEFTIN  Take 1 tablet (500 mg total) by mouth 2 (two) times daily with a meal.     cholecalciferol 1000 UNITS tablet  Commonly known as:  VITAMIN D  Take 5,000 Units by mouth daily.     citalopram 40 MG tablet  Commonly known as:  CELEXA  Take 40 mg by mouth every morning.     FARXIGA 10 MG Tabs  Generic drug:  Dapagliflozin Propanediol  Take 1 tablet by mouth daily.     ferrous sulfate 325 (65 FE) MG tablet  Take 325 mg by mouth 2 (two) times daily with a meal.     fluconazole 100 MG tablet  Commonly known as:  DIFLUCAN  Take 1 tablet (100 mg total) by mouth daily.     glipiZIDE 5 MG 24 hr tablet  Commonly known as:  GLUCOTROL XL  Take 5 mg by mouth daily with breakfast.     hydrochlorothiazide 25 MG tablet   Commonly known as:  HYDRODIURIL  Take 25 mg by mouth daily.     ibuprofen 200 MG tablet  Commonly known as:  ADVIL,MOTRIN  Take 400 mg by mouth every 6 (six) hours as needed for moderate pain.     lactobacillus acidophilus Tabs tablet  Take 1 tablet by mouth daily.     lamoTRIgine 200 MG tablet  Commonly known as:  LAMICTAL  Take 100-200 mg by mouth 2 (two) times daily. Takes 200 mg in am and 100 mg in  pm     levETIRAcetam 750 MG tablet  Commonly known as:  KEPPRA  Take 750 mg by mouth 2 (two) times daily.     lisinopril 10 MG tablet  Commonly known as:  PRINIVIL,ZESTRIL  Take 10 mg by mouth 2 (two) times daily.     LORazepam 0.5 MG tablet  Commonly known as:  ATIVAN  Take 0.5 mg by mouth every 8 (eight) hours as needed for anxiety.     metFORMIN 500 MG tablet  Commonly known as:  GLUCOPHAGE  Take 500 mg by mouth 2 (two) times daily with a meal.     omega-3 acid ethyl esters 1 G capsule  Commonly known as:  LOVAZA  Take 1 g by mouth 2 (two) times daily.     oxyCODONE-acetaminophen 7.5-325 MG per tablet  Commonly known as:  PERCOCET  Take 1 tablet by mouth every 6 (six) hours as needed for pain.     promethazine 12.5 MG tablet  Commonly known as:  PHENERGAN  Take 2 tablets (25 mg total) by mouth every 6 (six) hours as needed for nausea.     ROLAIDS PO  Take 1 tablet by mouth every 4 (four) hours as needed (indigestion).     terbinafine 250 MG tablet  Commonly known as:  LAMISIL  Take 250 mg by mouth daily.       No Known Allergies    The results of significant diagnostics from this hospitalization (including imaging, microbiology, ancillary and laboratory) are listed below for reference.    Significant Diagnostic Studies: Ct Abdomen Pelvis Wo Contrast  03/28/2014   CLINICAL DATA:  Patient taking medication for 3 weeks for kidney infection. Chronic pain in the lower tail bone and left flank.  EXAM: CT ABDOMEN AND PELVIS WITHOUT CONTRAST  TECHNIQUE:  Multidetector CT imaging of the abdomen and pelvis was performed following the standard protocol without intravenous contrast.  COMPARISON:  DG ABDOMEN 1V dated 02/04/2014; CT ABD/PELV WO CM dated 05/11/2013  FINDINGS: The lung bases are clear.  Both kidneys demonstrate lobular architecture with focal scarring. The left kidney demonstrates appearance of diffuse atrophy. There is a stone in the lower pole of the left kidney measuring about 5 mm in diameter. No ureteral stones or bladder stones are demonstrated. No pyelocaliectasis or ureterectasis on either side. The bladder wall is not thickened.  Surgical absence of the gallbladder. The unenhanced appearance of the liver, spleen, pancreas, adrenal glands, abdominal aorta, inferior vena cava, and retroperitoneal lymph nodes is unremarkable. The stomach and small bowel are decompressed. Stool-filled colon without distention. No free air or free fluid in the abdomen. A surgical clip is present in the anterior mid abdomen.  Pelvis: Lobular appearance of the uterus consistent with fibroids. Small left ovarian cyst is likely functional. No bladder wall thickening. No free or loculated pelvic fluid collections. Appendix is not identified. No inflammatory changes in the pelvis. No pelvic mass or lymphadenopathy. Normal alignment of the lumbar spine. No destructive bone lesions.  IMPRESSION: Nonobstructing stone in the lower pole left kidney. There appears to be bilateral renal scarring with more prominent diffuse atrophy on the left kidney. No ureteral stone or obstruction. No inflammatory process demonstrated.   Electronically Signed   By: Lucienne Capers M.D.   On: 03/28/2014 23:35    Microbiology: Recent Results (from the past 240 hour(s))  URINE CULTURE     Status: None   Collection Time    03/28/14 10:11 PM  Result Value Ref Range Status   Specimen Description URINE, RANDOM   Final   Special Requests NONE   Final   Culture  Setup Time     Final   Value:  03/29/2014 04:17     Performed at [place]     Final   Value: >=100,000 COLONIES/ML     Performed at [facility]   Culture     Final   Value: YEAST     Performed at [facility]   Report Status 03/30/2014 FINAL   Final  MRSA PCR SCREENING     Status: None   Collection Time    03/29/14  1:32 AM      Result Value Ref Range Status   MRSA by PCR NEGATIVE  NEGATIVE Final   Comment:            The GeneXpert MRSA Assay (FDA     approved for NASAL specimens     only), is one component of a     comprehensive MRSA colonization     surveillance program. It is not     intended to diagnose MRSA     infection nor to guide or     monitor treatment for     MRSA infections.     Labs: Basic Metabolic Panel:  Recent Labs Lab 03/28/14 2145 03/29/14 0230 03/30/14 0553  NA 136* 136* 139  K 4.0 3.6* 4.3  CL 91* 98 102  CO2 25 26 27   GLUCOSE 157* 115* 144*  BUN 19 16 10   CREATININE 0.91 0.88 0.77  CALCIUM 11.7* 9.1 8.2*   Liver Function Tests:  Recent Labs Lab 03/28/14 2145  AST 18  ALT 19  ALKPHOS 118*  BILITOT 0.3  PROT 7.5  ALBUMIN 3.4*    Recent Labs Lab 03/28/14 2145  LIPASE 29   No results found for this basename: AMMONIA,  in the last 168 hours CBC:  Recent Labs Lab 03/28/14 2145 03/29/14 0230  WBC 12.5* 8.3  NEUTROABS 7.8*  --   HGB 14.8 12.2  HCT 44.0 36.8  MCV 87.3 86.6  PLT 306 261   Cardiac Enzymes: No results found for this basename: CKTOTAL, CKMB, CKMBINDEX, TROPONINI,  in the last 168 hours BNP: BNP (last 3 results) No results found for this basename: PROBNP,  in the last 8760 hours CBG:  Recent Labs Lab 03/29/14 0752 03/29/14 1200 03/29/14 1724 03/29/14 2158 03/30/14 1110  GLUCAP 123* 176* 147* 235* 186*       Signed:  Debbe Odea, MD  [facility] 03/30/2014, 11:27 AM

## 2014-04-01 LAB — PTH, INTACT AND CALCIUM
Calcium, Total (PTH): 8.9 mg/dL (ref 8.4–10.5)
PTH: 25.7 pg/mL (ref 14.0–72.0)

## 2014-04-04 LAB — CULTURE, BLOOD (ROUTINE X 2)
CULTURE: NO GROWTH
Culture: NO GROWTH

## 2014-07-07 IMAGING — CT CT ABD-PELV W/O CM
2 of 4 series · 16 of 46 positions shown, 18 images · non-contrast
Comparison: CT of the abdomen and pelvis performed 08/30/2007

CLINICAL DATA: Bilateral flank pain, nausea and vomiting.

CT ABDOMEN AND PELVIS WITHOUT CONTRAST
TECHNIQUE: Multidetector CT imaging of the abdomen and pelvis was
performed following the standard protocol without intravenous
contrast.

[Series 2: renal stone > 200 lbs 5.0 b31f · axial · 0.96mm/px · z∈[+745,+1195]mm · 13 of 100 slices shown, 15 images]
[im 5/100  soft-tissue]
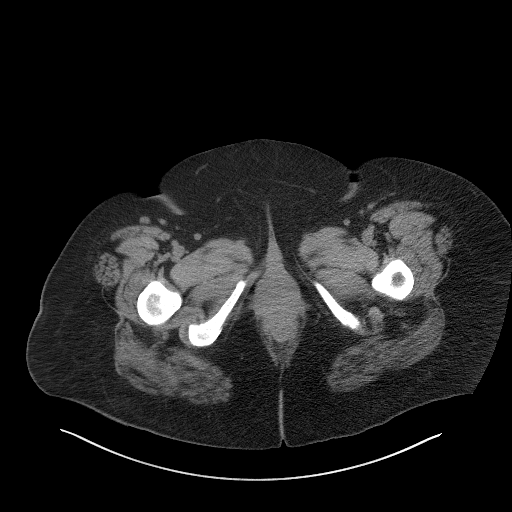
[im 5/100  bone]
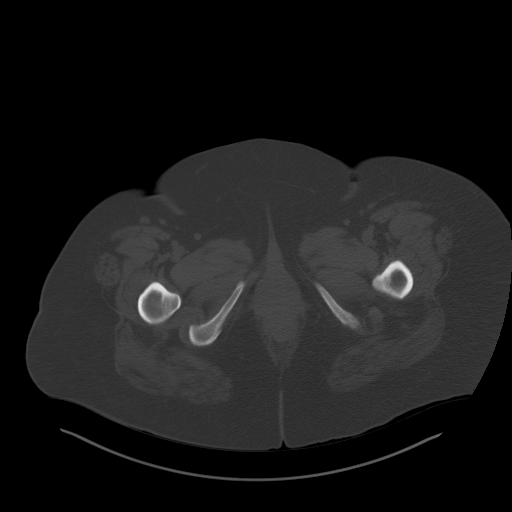
[im 13/100  soft-tissue]
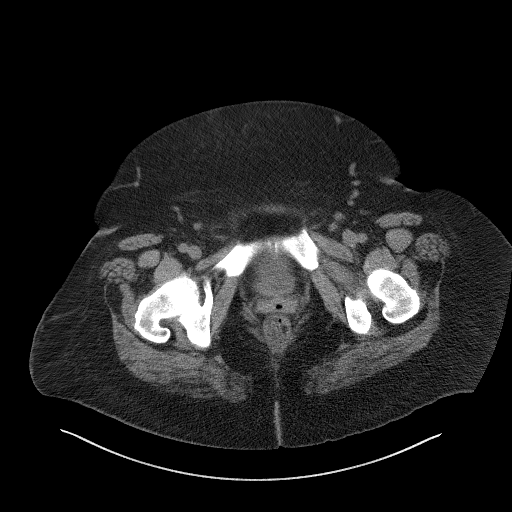
[im 22/100  soft-tissue]
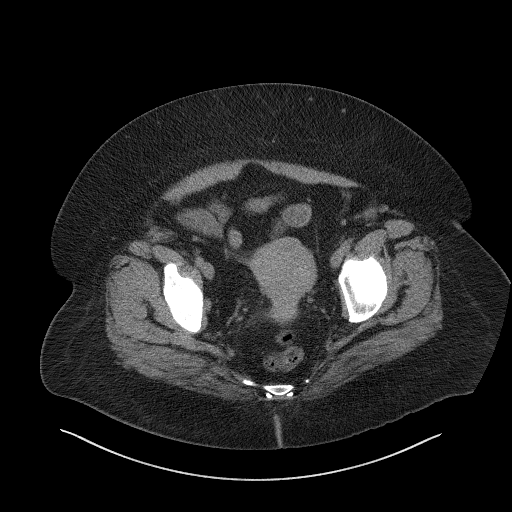
[im 26/100  soft-tissue]
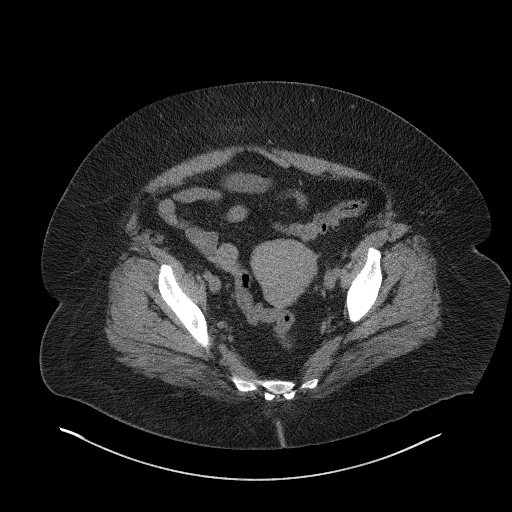
[im 35/100  soft-tissue]
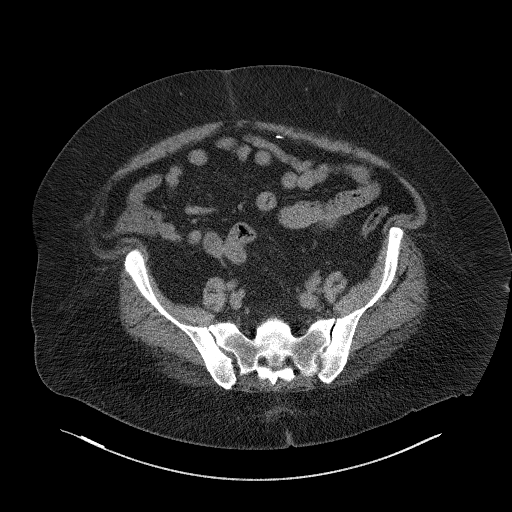
[im 44/100  soft-tissue]
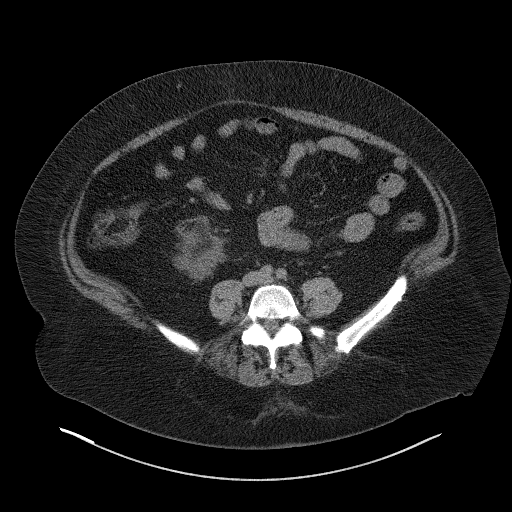
[im 52/100  soft-tissue]
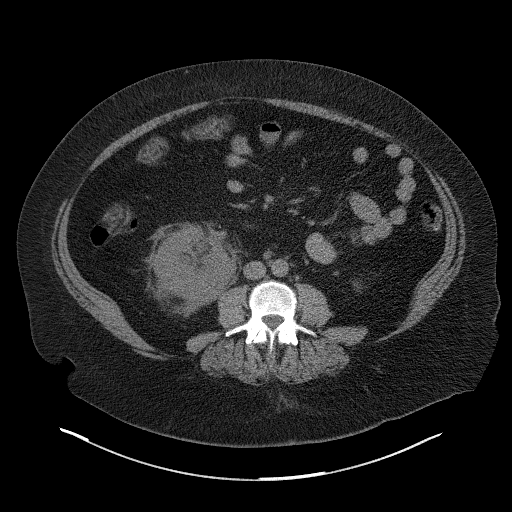
[im 56/100  soft-tissue]
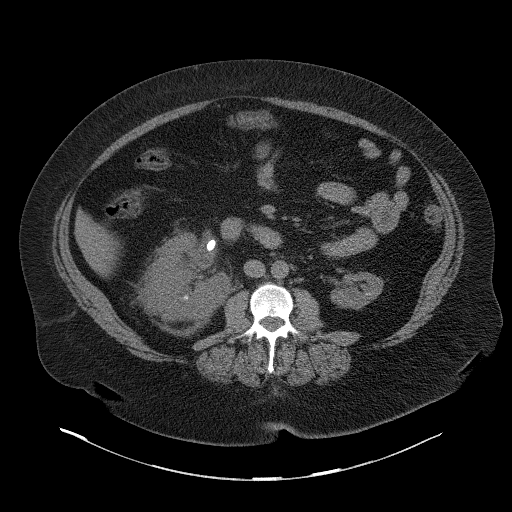
[im 65/100  soft-tissue]
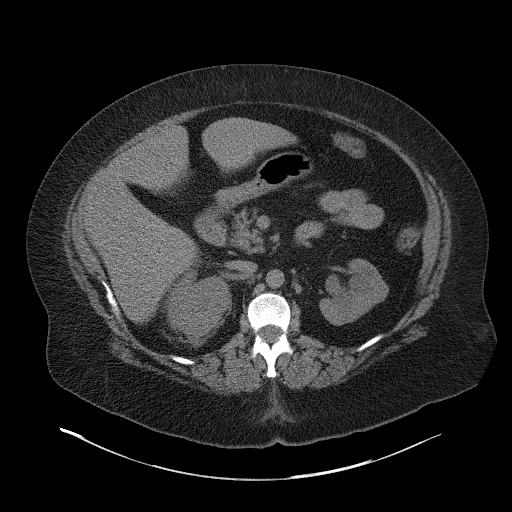
[im 65/100  bone]
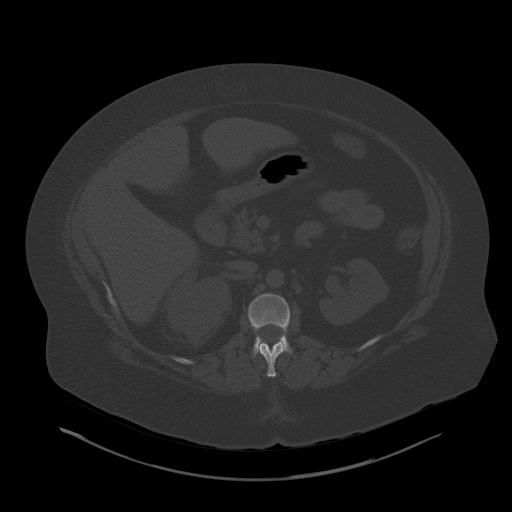
[im 74/100  soft-tissue]
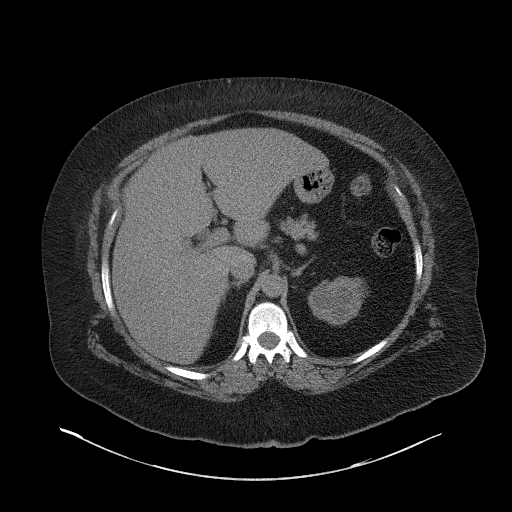
[im 78/100  soft-tissue]
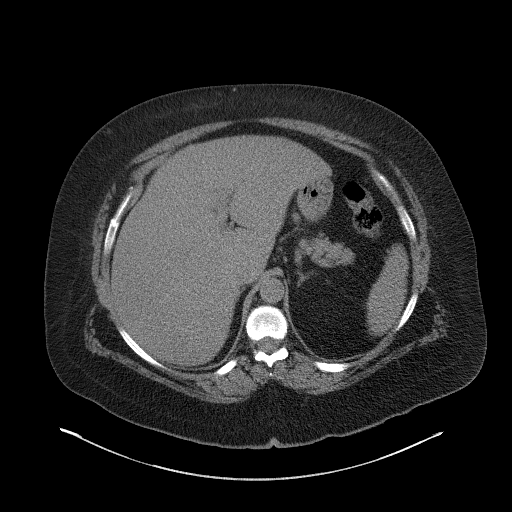
[im 87/100  soft-tissue]
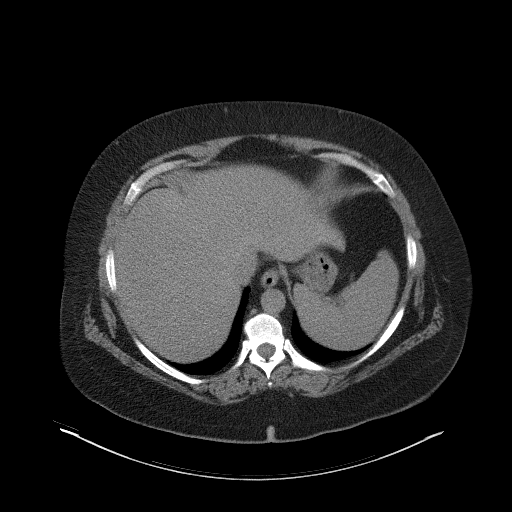
[im 95/100  soft-tissue]
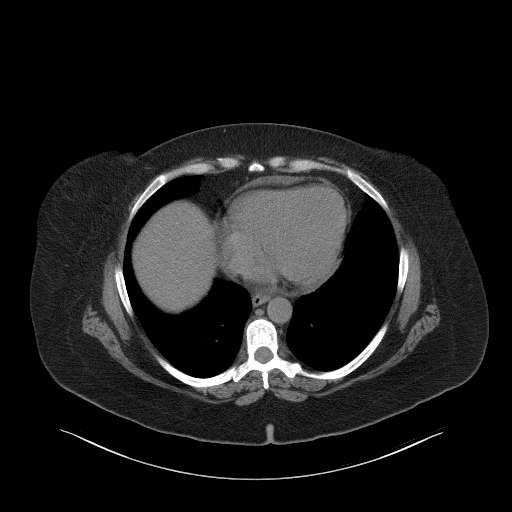

[Series 5: renal stone 3.0 coronal · coronal · 0.98mm/px · 3 of 115 slices shown]
[im 39/115  soft-tissue]
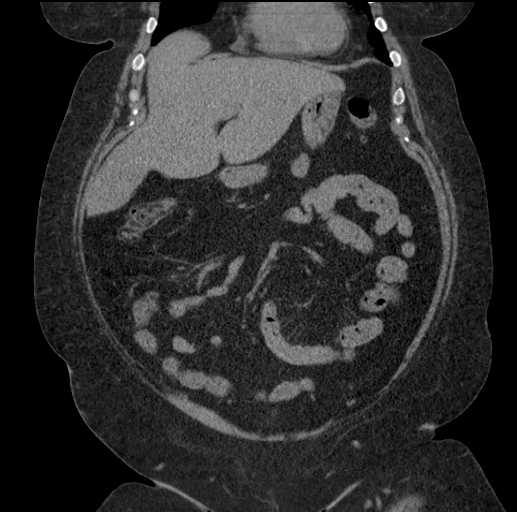
[im 51/115  soft-tissue]
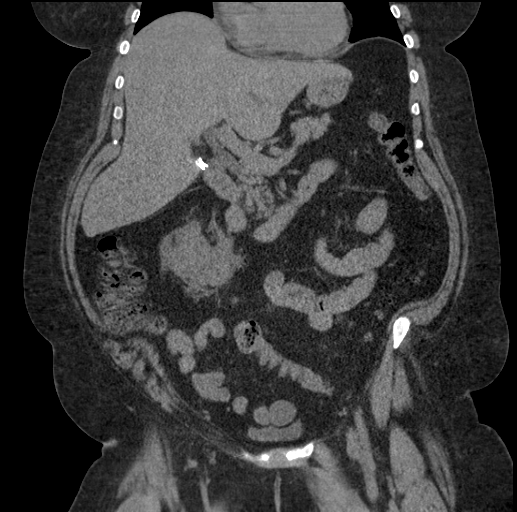
[im 64/115  soft-tissue]
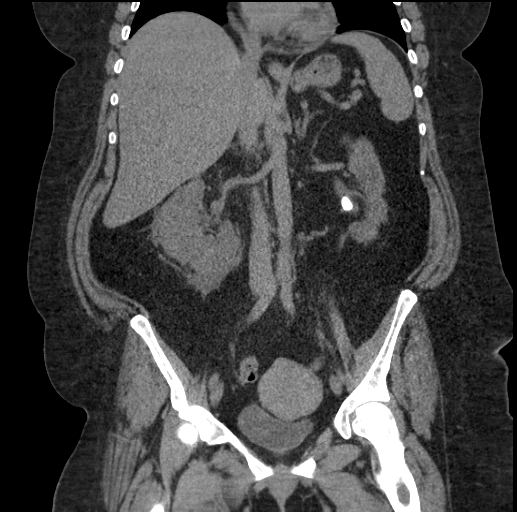

[16 of 46 positions shown; findings below may reference images not displayed]

FINDINGS: The visualized lung bases are clear.  A small pericardial
effusion is noted.

The liver and spleen are unremarkable in appearance.  The patient
is status post cholecystectomy, with clips noted along the
gallbladder fossa.  The pancreas and adrenal glands are
unremarkable.

There is moderate right-sided hydronephrosis and moderately severe
left-sided hydronephrosis.  On the left, this appears relatively
chronic in nature, with marked dilatation of the renal calyces and
thinning of the renal parenchyma.  On the right, this is relatively
acute, with extensive surrounding perinephric stranding and fluid.
Underlying pyelonephritis cannot be excluded, given the degree of
soft tissue stranding.

On the left, this appears to reflect slight distal migration of a
1.5 x 1.3 cm stone at the left renal pelvis, causing obstruction at
the ureteropelvic junction.  On the right, this appears to reflect
obstruction due to a 1.2 x 1.1 cm stone at the right renal pelvis,
obstructing the right ureteropelvic junction.

Bilateral 9 mm nonobstructing stones are seen.  No significant left-
sided perinephric stranding is appreciated.

The small bowel is unremarkable in appearance.  The stomach is
within normal limits.  No acute vascular abnormalities are seen.

The patient is status post appendectomy.  The colon is largely
decompressed.  Minimal diverticulosis is noted along the descending
and proximal sigmoid colon, without evidence of diverticulitis.

The bladder is decompressed and not well assessed.  The uterus is
grossly normal in size.  No suspicious adnexal masses are seen; the
ovaries are grossly unremarkable in appearance.  No inguinal
lymphadenopathy is seen.

No acute osseous abnormalities are identified.
IMPRESSION: 1.  Acute moderate right-sided hydronephrosis and relatively
chronic appearing moderately severe left-sided hydronephrosis.  On
the left, there is marked dilatation of the renal calyces and
thinning of the renal parenchyma.

The relatively acute nature of the right-sided hydronephrosis is
demonstrated by extensive surrounding perinephric stranding and
fluid.  Underlying right-sided pyelonephritis cannot be excluded,
given the degree of soft tissue stranding.

This reflects bilateral obstructing stones at the renal pelves,
obstructing both ureteropelvic junctions.  The right-sided stone
measures 1.2 x 1.1 cm, while the left-sided stone measures 1.5 x
1.3 cm.

Bilateral obstruction raises concern for impending renal failure;
would consult for emergent intervention.

2.  Bilateral 9 mm nonobstructing stones seen.
3.  Minimal diverticulosis along the descending and proximal
sigmoid colon, without evidence of diverticulitis.
4.  Small pericardial effusion noted.

to Dr. Lesya Aujla, who verbally acknowledged these results.

## 2014-08-09 IMAGING — RF DG C-ARM 1-60 MIN-NO REPORT
1 series · 7 of 7 positions shown · non-contrast
Comparison: 05/11/2013 CT

CLINICAL DATA: URETEROSCOPY.

DG C-ARM 1-60 MIN - NRPT [facility]

[Series 1: run · 7 of 7 slices shown]
[im 1/7]
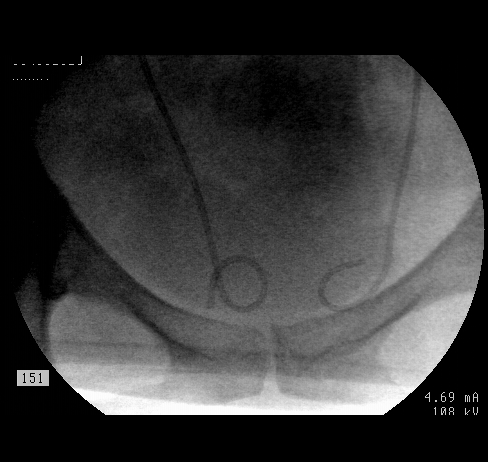
[im 2/7]
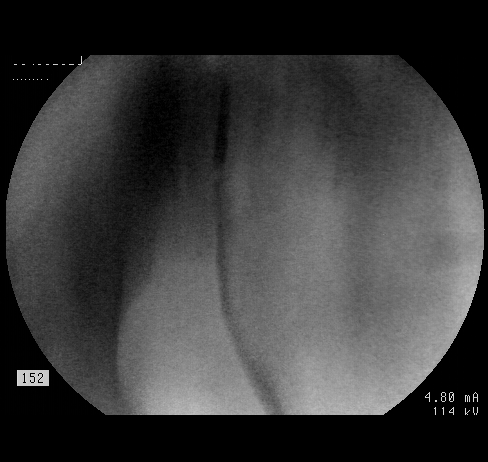
[im 3/7]
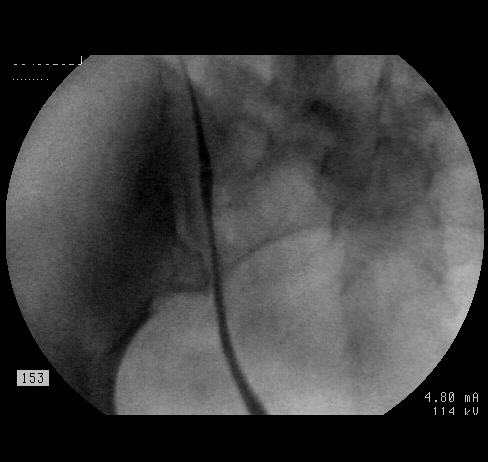
[im 4/7]
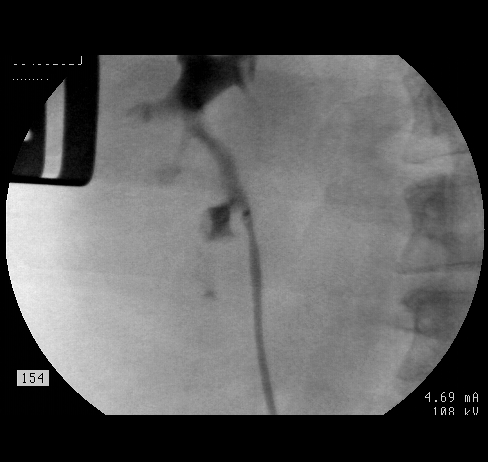
[im 5/7]
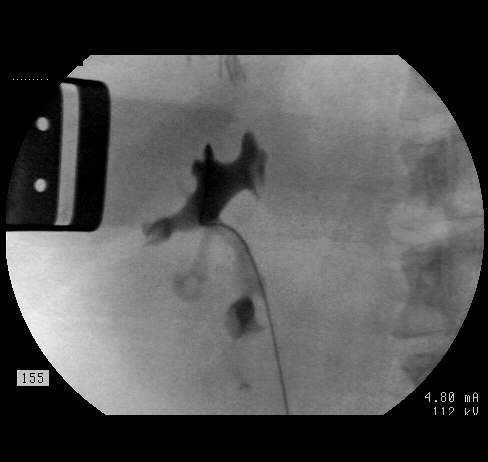
[im 6/7]
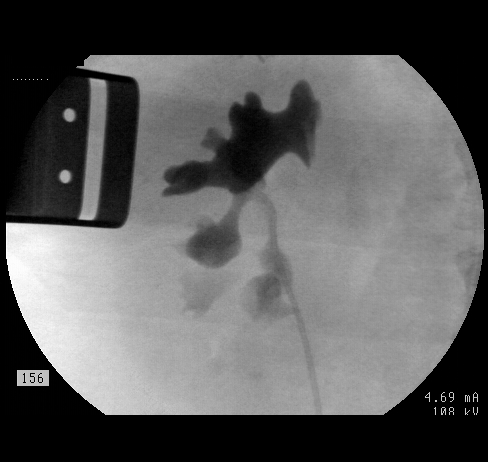
[im 7/7]
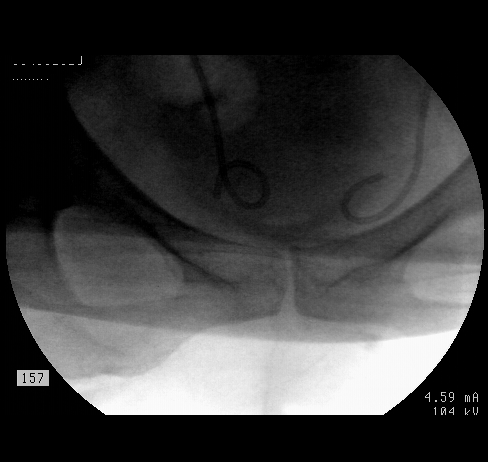

[7 of 7 positions shown; findings below may reference images not displayed]

FINDINGS: Multiple intraoperative spot images are submitted from
right retrograde pyelogram.  This demonstrates normal caliber
ureter and collecting system.  Final images demonstrate the
proximal and distal portion of a ureteral stent.  Distal portion of
the left ureteral stent also noted.
IMPRESSION: As above.

## 2014-08-17 IMAGING — CR DG ABDOMEN 1V
1 series · 1 of 1 positions shown · non-contrast
Comparison: 06/19/2013

CLINICAL DATA: Evaluate stent placement

ABDOMEN - 1 VIEW

[t abdomen supine]
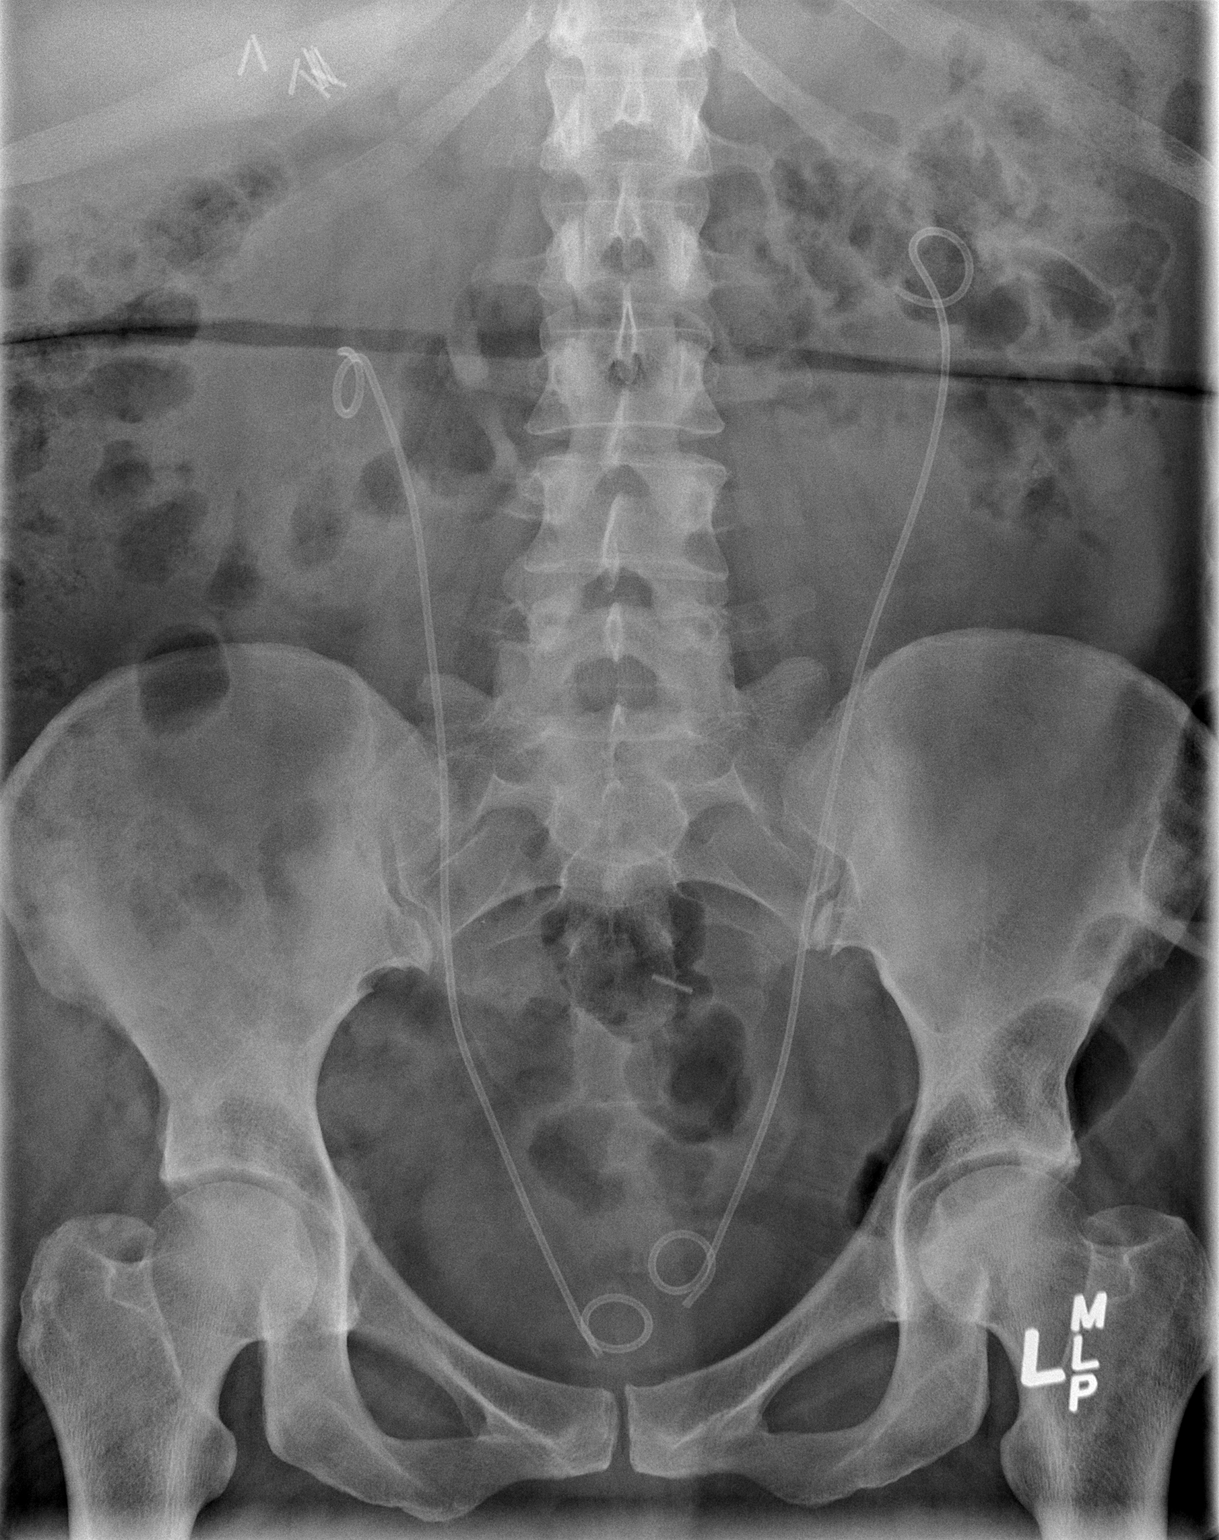

[1 of 1 positions shown; findings below may reference images not displayed]

FINDINGS: Cholecystectomy clips are noted in the right upper
quadrant of the abdomen.  There are bilateral Nephro ureteral
stents in place.  The stents appear to be in satisfactory position.
No ureteral calculi identified.
IMPRESSION: 1.  Satisfactory position of bilateral Nephro ureteral stents.

## 2014-09-04 HISTORY — PX: SPINAL CORD STIMULATOR IMPLANT: SHX2422

## 2014-10-01 DIAGNOSIS — F4000 Agoraphobia, unspecified: Secondary | ICD-10-CM | POA: Insufficient documentation

## 2014-10-01 DIAGNOSIS — F329 Major depressive disorder, single episode, unspecified: Secondary | ICD-10-CM | POA: Insufficient documentation

## 2014-10-01 DIAGNOSIS — F410 Panic disorder [episodic paroxysmal anxiety] without agoraphobia: Secondary | ICD-10-CM | POA: Insufficient documentation

## 2015-03-10 ENCOUNTER — Emergency Department (HOSPITAL_BASED_OUTPATIENT_CLINIC_OR_DEPARTMENT_OTHER): Payer: Commercial Managed Care - HMO

## 2015-03-10 ENCOUNTER — Encounter (HOSPITAL_BASED_OUTPATIENT_CLINIC_OR_DEPARTMENT_OTHER): Payer: Self-pay

## 2015-03-10 ENCOUNTER — Emergency Department (HOSPITAL_BASED_OUTPATIENT_CLINIC_OR_DEPARTMENT_OTHER)
Admission: EM | Admit: 2015-03-10 | Discharge: 2015-03-10 | Disposition: A | Discharge status: Home / Self Care | Payer: Commercial Managed Care - HMO | Attending: Emergency Medicine | Admitting: Emergency Medicine

## 2015-03-10 DIAGNOSIS — Z8619 Personal history of other infectious and parasitic diseases: Secondary | ICD-10-CM | POA: Insufficient documentation

## 2015-03-10 DIAGNOSIS — Y998 Other external cause status: Secondary | ICD-10-CM | POA: Diagnosis not present

## 2015-03-10 DIAGNOSIS — Z792 Long term (current) use of antibiotics: Secondary | ICD-10-CM | POA: Insufficient documentation

## 2015-03-10 DIAGNOSIS — N183 Chronic kidney disease, stage 3 (moderate): Secondary | ICD-10-CM | POA: Diagnosis not present

## 2015-03-10 DIAGNOSIS — J45909 Unspecified asthma, uncomplicated: Secondary | ICD-10-CM | POA: Insufficient documentation

## 2015-03-10 DIAGNOSIS — Y9389 Activity, other specified: Secondary | ICD-10-CM | POA: Insufficient documentation

## 2015-03-10 DIAGNOSIS — F329 Major depressive disorder, single episode, unspecified: Secondary | ICD-10-CM | POA: Insufficient documentation

## 2015-03-10 DIAGNOSIS — E785 Hyperlipidemia, unspecified: Secondary | ICD-10-CM | POA: Insufficient documentation

## 2015-03-10 DIAGNOSIS — D649 Anemia, unspecified: Secondary | ICD-10-CM | POA: Diagnosis not present

## 2015-03-10 DIAGNOSIS — M1990 Unspecified osteoarthritis, unspecified site: Secondary | ICD-10-CM | POA: Insufficient documentation

## 2015-03-10 DIAGNOSIS — E119 Type 2 diabetes mellitus without complications: Secondary | ICD-10-CM | POA: Insufficient documentation

## 2015-03-10 DIAGNOSIS — Z9889 Other specified postprocedural states: Secondary | ICD-10-CM | POA: Diagnosis not present

## 2015-03-10 DIAGNOSIS — M797 Fibromyalgia: Secondary | ICD-10-CM | POA: Insufficient documentation

## 2015-03-10 DIAGNOSIS — X58XXXA Exposure to other specified factors, initial encounter: Secondary | ICD-10-CM | POA: Diagnosis not present

## 2015-03-10 DIAGNOSIS — Z79899 Other long term (current) drug therapy: Secondary | ICD-10-CM | POA: Insufficient documentation

## 2015-03-10 DIAGNOSIS — S29092A Other injury of muscle and tendon of back wall of thorax, initial encounter: Secondary | ICD-10-CM | POA: Diagnosis not present

## 2015-03-10 DIAGNOSIS — I129 Hypertensive chronic kidney disease with stage 1 through stage 4 chronic kidney disease, or unspecified chronic kidney disease: Secondary | ICD-10-CM | POA: Diagnosis not present

## 2015-03-10 DIAGNOSIS — Z87442 Personal history of urinary calculi: Secondary | ICD-10-CM | POA: Diagnosis not present

## 2015-03-10 DIAGNOSIS — F419 Anxiety disorder, unspecified: Secondary | ICD-10-CM | POA: Diagnosis not present

## 2015-03-10 DIAGNOSIS — Y9289 Other specified places as the place of occurrence of the external cause: Secondary | ICD-10-CM | POA: Diagnosis not present

## 2015-03-10 DIAGNOSIS — S29012A Strain of muscle and tendon of back wall of thorax, initial encounter: Secondary | ICD-10-CM

## 2015-03-10 DIAGNOSIS — S299XXA Unspecified injury of thorax, initial encounter: Secondary | ICD-10-CM | POA: Diagnosis present

## 2015-03-10 MED ORDER — HYDROMORPHONE HCL 2 MG PO TABS: 1.0000 mg | ORAL_TABLET | ORAL | Status: DC | PRN

## 2015-03-10 MED ORDER — HYDROMORPHONE HCL 1 MG/ML IJ SOLN
1.0000 mg | Freq: Once | INTRAMUSCULAR | Status: AC
  Administered 2015-03-10: 1 mg via INTRAMUSCULAR
  Filled 2015-03-10: qty 1

## 2015-03-10 MED ORDER — PREDNISONE 50 MG PO TABS: 50.0000 mg | ORAL_TABLET | Freq: Every day | ORAL | Status: DC

## 2015-03-10 MED ORDER — DIAZEPAM 5 MG PO TABS: 5.0000 mg | ORAL_TABLET | Freq: Three times a day (TID) | ORAL | Status: DC | PRN

## 2015-03-10 NOTE — ED Provider Notes (Signed)
CSN: [id number]     Arrival date & time 03/10/15  1651 History   First MD Initiated Contact with Patient 03/10/15 2013     Chief Complaint  Patient presents with  . Back Pain     (Consider location/radiation/quality/duration/timing/severity/associated sxs/prior Treatment) HPI Patient presents to the emergency department with right upper back pain that has been ongoing over the last 6 weeks.  The patient states she is being seen at a pain clinic and the pain is worse over the last 2 weeks.  She was given steroid injections and feels that the pain is no better.  Patient, states she has not had any shortness of breath, chest pain, nausea, vomiting, weakness, dizziness, headache, blurred vision, fever, cough, abdominal pain, or syncope.  The patient states that palpation and movement make the pain worse.  She has an appointment this Friday with her pain management doctor Past Medical History  Diagnosis Date  . Hypertension   . Anxiety   . Depression   . Fibromyalgia   . Hyperlipidemia   . Seasonal asthma   . Diabetes mellitus, type 2   . CKD (chronic kidney disease), stage III     In record, but baseline kidney function appears to have GFR > 90  . Wears glasses   . Arthritis     TAILBONE AREA  . Iron deficiency anemia   . Bilateral hydronephrosis   . History of sepsis     05-11-2013  W/ UROSEPSIS---  RESOLIVED  . Normal cardiac stress test     PER PT 2007  . Atrophic kidney     LEFT  . Renal calculi     bilateral   Past Surgical History  Procedure Laterality Date  . Coccyx removal  2009  . Hysteroscopy w/d&c N/A 04/20/2013    Procedure: DILATATION AND CURETTAGE /HYSTEROSCOPY;  Surgeon: [name], MD;  Location: [facility];  Service: Gynecology;  Laterality: N/A;  . Polypectomy N/A 04/20/2013    Procedure: POLYPECTOMY;  Surgeon: [name], MD;  Location: [facility];  Service: Gynecology;  Laterality: N/A;  . Cystoscopy with stent placement Bilateral 05/11/2013    Procedure:  CYSTOSCOPY WITH STENT PLACEMENT;  Surgeon: Alexis Frock, MD;  Location: [facility];  Service: Urology;  Laterality: Bilateral;  . Laparoscopic cholecystectomy  AGE 87  . Appendectomy  AGE 23  . Cystoscopy with retrograde pyelogram, ureteroscopy and stent placement Right 06/13/2013    Procedure: First Stage Bilateral Ureteroscopy;  Surgeon: Alexis Frock, MD;  Location: [facility];  Service: Urology;  Laterality: Right;  . Holmium laser application Right 0/02/5008    Procedure: HOLMIUM LASER APPLICATION, right ureteral stone;  Surgeon: Alexis Frock, MD;  Location: [facility];  Service: Urology;  Laterality: Right;  . Cystoscopy w/ ureteral stent placement Right 06/13/2013    Procedure: CYSTOSCOPY WITH STENT REPLACEMENT, right;  Surgeon: Alexis Frock, MD;  Location: [facility];  Service: Urology;  Laterality: Right;  . Cystoscopy with retrograde pyelogram, ureteroscopy and stent placement Bilateral 06/20/2013    Procedure: CYSTOSCOPY WITH RETROGRADE PYELOGRAM, URETEROSCOPY AND STENT PLACEMENT, REMOVAL BILATERAL STENTS;  Surgeon: Alexis Frock, MD;  Location: [facility];  Service: Urology;  Laterality: Bilateral;  Bilateral stent repalcement with not string  . Holmium laser application Right 3/81/8299    Procedure: HOLMIUM LASER APPLICATION;  Surgeon: Alexis Frock, MD;  Location: [facility];  Service: Urology;  Laterality: Right;  . Spinal cord stimulator implant     No family  history on file. History  Substance Use Topics  . Smoking status: Never Smoker   . Smokeless tobacco: Never Used  . Alcohol Use: No   OB History    No data available     Review of Systems  All other systems negative except as documented in the HPI. All pertinent positives and negatives as reviewed in the HPI.  Allergies  Review of patient's allergies indicates no known allergies.  Home Medications   Prior to Admission medications    Medication Sig Start Date End Date Taking? Authorizing Provider  albuterol (PROVENTIL HFA;VENTOLIN HFA) 108 (90 BASE) MCG/ACT inhaler Inhale 2 puffs into the lungs every 6 (six) hours as needed for wheezing.    Historical Provider, MD  aspirin-acetaminophen-caffeine (EXCEDRIN MIGRAINE) [id number] MG per tablet Take 1 tablet by mouth every 6 (six) hours as needed for headache.    Historical Provider, MD  aspirin-sod bicarb-citric acid (ALKA-SELTZER) 325 MG TBEF tablet Take 325 mg by mouth every 6 (six) hours as needed (indigestion).    Historical Provider, MD  atorvastatin (LIPITOR) 80 MG tablet Take 80 mg by mouth every morning.     Historical Provider, MD  buPROPion (WELLBUTRIN) 75 MG tablet Take 75 mg by mouth 2 (two) times daily.    Historical Provider, MD  Ca Carbonate-Mag Hydroxide (ROLAIDS PO) Take 1 tablet by mouth every 4 (four) hours as needed (indigestion).    Historical Provider, MD  cefUROXime (CEFTIN) 500 MG tablet Take 1 tablet (500 mg total) by mouth 2 (two) times daily with a meal. 03/30/14   Debbe Odea, MD  cholecalciferol (VITAMIN D) 1000 UNITS tablet Take 5,000 Units by mouth daily.    Historical Provider, MD  citalopram (CELEXA) 40 MG tablet Take 40 mg by mouth every morning.     Historical Provider, MD  Dapagliflozin Propanediol (FARXIGA) 10 MG TABS Take 1 tablet by mouth daily.    Historical Provider, MD  ferrous sulfate 325 (65 FE) MG tablet Take 325 mg by mouth 2 (two) times daily with a meal.    Historical Provider, MD  fluconazole (DIFLUCAN) 100 MG tablet Take 1 tablet (100 mg total) by mouth daily. 03/30/14   Debbe Odea, MD  glipiZIDE (GLUCOTROL XL) 5 MG 24 hr tablet Take 5 mg by mouth daily with breakfast.    Historical Provider, MD  hydrochlorothiazide (HYDRODIURIL) 25 MG tablet Take 25 mg by mouth daily.    Historical Provider, MD  ibuprofen (ADVIL,MOTRIN) 200 MG tablet Take 400 mg by mouth every 6 (six) hours as needed for moderate pain.    Historical Provider, MD   lactobacillus acidophilus (BACID) TABS tablet Take 1 tablet by mouth daily.    Historical Provider, MD  lamoTRIgine (LAMICTAL) 200 MG tablet Take 100-200 mg by mouth 2 (two) times daily. Takes 200 mg in am and 100 mg in pm    Historical Provider, MD  levETIRAcetam (KEPPRA) 750 MG tablet Take 750 mg by mouth 2 (two) times daily.    Historical Provider, MD  lisinopril (PRINIVIL,ZESTRIL) 10 MG tablet Take 10 mg by mouth 2 (two) times daily.    Historical Provider, MD  LORazepam (ATIVAN) 0.5 MG tablet Take 0.5 mg by mouth every 8 (eight) hours as needed for anxiety.    Historical Provider, MD  metFORMIN (GLUCOPHAGE) 500 MG tablet Take 500 mg by mouth 2 (two) times daily with a meal.    Historical Provider, MD  omega-3 acid ethyl esters (LOVAZA) 1 G capsule Take 1 g by mouth 2 (  two) times daily.     Historical Provider, MD  oxyCODONE-acetaminophen (PERCOCET) 7.5-325 MG per tablet Take 1 tablet by mouth every 6 (six) hours as needed for pain. 06/20/13   Alexis Frock, MD  promethazine (PHENERGAN) 12.5 MG tablet Take 2 tablets (25 mg total) by mouth every 6 (six) hours as needed for nausea. 06/13/13   Alexis Frock, MD  terbinafine (LAMISIL) 250 MG tablet Take 250 mg by mouth daily.    Historical Provider, MD   BP 162/99 mmHg  Pulse 85  Temp(Src) 98.4 F (36.9 C) (Oral)  Resp 20  Ht 5\' 6"  (1.676 m)  Wt 260 lb (117.935 kg)  BMI 41.99 kg/m2  SpO2 100%  LMP 03/07/2014 Physical Exam  Constitutional: She is oriented to person, place, and time. She appears well-developed and well-nourished. No distress.  HENT:  Head: Normocephalic and atraumatic.  Mouth/Throat: Oropharynx is clear and moist.  Eyes: Pupils are equal, round, and reactive to light.  Neck: Normal range of motion. Neck supple.  Cardiovascular: Normal rate, regular rhythm and normal heart sounds.  Exam reveals no gallop and no friction rub.   No murmur heard. Neurological: She is alert and oriented to person, place, and time. She  exhibits normal muscle tone. Coordination normal.  Skin: Skin is warm and dry. No rash noted. No erythema.  Nursing note and vitals reviewed.   ED Course  Procedures (including critical care time) Labs Review Labs Reviewed - No data to display  Imaging Review Dg Chest 2 View  03/10/2015   CLINICAL DATA:  Back pain on the right side for 6 weeks.  EXAM: CHEST  2 VIEW  COMPARISON:  07/01/2013  FINDINGS: No cardiomegaly. Stable aortic and hilar contours. There is no edema, consolidation, effusion, or pneumothorax. Interval addition of dorsal column stimulator, leads at the lower thoracic level.  IMPRESSION: No active cardiopulmonary disease.   Electronically Signed   By: Monte Fantasia M.D.   On: 03/10/2015 21:32     EKG Interpretation   Date/Time:  Monday March 10 2015 21:03:31 EDT Ventricular Rate:  80 PR Interval:  144 QRS Duration: 88 QT Interval:  372 QTC Calculation: 429 R Axis:   56 Text Interpretation:  Normal sinus rhythm Low voltage QRS Borderline ECG  since last tracing no significant change Confirmed by BELFI  MD, MELANIE  ([id number]) on 03/10/2015 9:06:15 PM      Patient will be referred back to her pain management doctor, told to return here as needed.  Told to use ice and heat on the area feel that this is a muscular strain  [employer], PA-C 03/10/15 [place], MD 03/11/15 [phone]

## 2015-03-10 NOTE — ED Notes (Signed)
Upper back pain x 6 weeks-is being treating at pain clinic-states pain is much worse x 2 weeks

## 2015-03-10 NOTE — ED Notes (Signed)
Patient transported to X-ray 

## 2015-03-10 NOTE — Discharge Instructions (Signed)
Follow-up with your pain management doctor.  Return here as needed

## 2015-04-09 ENCOUNTER — Other Ambulatory Visit: Payer: Self-pay

## 2015-04-11 LAB — CYTOLOGY - PAP

## 2015-06-11 NOTE — H&P (Addendum)
Grace Singh is an 49 y.o. female with PMB and endometrial polyp presents for surgical mngt  Menstrual History:  Patient's last menstrual period was 03/07/2014.    Past Medical History  Diagnosis Date  . Hypertension   . Anxiety   . Depression   . Fibromyalgia   . Hyperlipidemia   . Seasonal asthma   . Diabetes mellitus, type 2   . CKD (chronic kidney disease), stage III     In record, but baseline kidney function appears to have GFR > 90  . Wears glasses   . Arthritis     TAILBONE AREA  . Iron deficiency anemia   . Bilateral hydronephrosis   . History of sepsis     05-11-2013  W/ UROSEPSIS---  RESOLIVED  . Normal cardiac stress test     PER PT 2007  . Atrophic kidney     LEFT  . Renal calculi     bilateral    Past Surgical History  Procedure Laterality Date  . Coccyx removal  2009  . Hysteroscopy w/d&c N/A 04/20/2013    Procedure: DILATATION AND CURETTAGE /HYSTEROSCOPY;  Surgeon: Grace Pearson, MD;  Location: [facility];  Service: Gynecology;  Laterality: N/A;  . Polypectomy N/A 04/20/2013    Procedure: POLYPECTOMY;  Surgeon: Grace Pearson, MD;  Location: [facility];  Service: Gynecology;  Laterality: N/A;  . Cystoscopy with stent placement Bilateral 05/11/2013    Procedure: CYSTOSCOPY WITH STENT PLACEMENT;  Surgeon: Grace Frock, MD;  Location: [facility];  Service: Urology;  Laterality: Bilateral;  . Laparoscopic cholecystectomy  AGE 47  . Appendectomy  AGE 15  . Cystoscopy with retrograde pyelogram, ureteroscopy and stent placement Right 06/13/2013    Procedure: First Stage Bilateral Ureteroscopy;  Surgeon: Grace Frock, MD;  Location: [facility];  Service: Urology;  Laterality: Right;  . Holmium laser application Right 08/11/2228    Procedure: HOLMIUM LASER APPLICATION, right ureteral stone;  Surgeon: Grace Frock, MD;  Location: [facility];  Service: Urology;  Laterality: Right;  . Cystoscopy w/ ureteral stent placement Right 06/13/2013   Procedure: CYSTOSCOPY WITH STENT REPLACEMENT, right;  Surgeon: Grace Frock, MD;  Location: [facility];  Service: Urology;  Laterality: Right;  . Cystoscopy with retrograde pyelogram, ureteroscopy and stent placement Bilateral 06/20/2013    Procedure: CYSTOSCOPY WITH RETROGRADE PYELOGRAM, URETEROSCOPY AND STENT PLACEMENT, REMOVAL BILATERAL STENTS;  Surgeon: Grace Frock, MD;  Location: [facility];  Service: Urology;  Laterality: Bilateral;  Bilateral stent repalcement with not string  . Holmium laser application Right 7/98/9211    Procedure: HOLMIUM LASER APPLICATION;  Surgeon: Grace Frock, MD;  Location: [facility];  Service: Urology;  Laterality: Right;  . Spinal cord stimulator implant      No family history on file.  Social History:  reports that she has never smoked. She has never used smokeless tobacco. She reports that she does not drink alcohol or use illicit drugs.  Allergies: No Known Allergies  No prescriptions prior to admission    ROS  Last menstrual period 03/07/2014. Physical Exam  AF, VSS Gen - NAD CV - RRR Lungs - clear Abd - soft, NT Ext - NT, no edema PV - uterus mobile NT  [place]:  Normal uterus and adnexa.  20mm polypoid mass in endometrial cavity.  No free fluid   Assessment/Plan:  PMB Hysteroscopy D&C, resection of endometrial polyp R/b/a discussed, questions answered, informed consent  Grace Singh 06/11/2015, 12:11 PM

## 2015-06-13 ENCOUNTER — Encounter (HOSPITAL_BASED_OUTPATIENT_CLINIC_OR_DEPARTMENT_OTHER): Payer: Self-pay | Admitting: *Deleted

## 2015-06-13 NOTE — Progress Notes (Signed)
CALLED DR ADKINS OFFICE , LM W/ HEATHER TO VERIFY IF T & S NEEDED FOR THIS PROCEDURE.

## 2015-06-16 ENCOUNTER — Encounter (HOSPITAL_BASED_OUTPATIENT_CLINIC_OR_DEPARTMENT_OTHER): Payer: Self-pay | Admitting: *Deleted

## 2015-06-16 NOTE — Progress Notes (Signed)
Pt instructed npo p mn 7/12 x lipitor, wellbutrin, celexa, Fe, lamictal, keppra w sip of water,  Pt to bring inhaler w Abigail Butts.  To [facility] 7/13 @ 0700.  Needs istat on arrival.  Ekg, cxr in chart.

## 2015-06-16 NOTE — Progress Notes (Signed)
Received message from [facility] at Dr. Danae Chen office.  T&S cancelled.

## 2015-06-17 MED ORDER — LACTATED RINGERS IV SOLN
INTRAVENOUS | Status: DC
  Filled 2015-06-17: qty 1000

## 2015-06-18 ENCOUNTER — Encounter (HOSPITAL_BASED_OUTPATIENT_CLINIC_OR_DEPARTMENT_OTHER)
Admission: RE | Disposition: A | Discharge status: Home / Self Care | Payer: Self-pay | Source: Ambulatory Visit | Attending: Obstetrics and Gynecology

## 2015-06-18 ENCOUNTER — Ambulatory Visit (HOSPITAL_BASED_OUTPATIENT_CLINIC_OR_DEPARTMENT_OTHER): Payer: Commercial Managed Care - HMO | Admitting: Anesthesiology

## 2015-06-18 ENCOUNTER — Ambulatory Visit (HOSPITAL_BASED_OUTPATIENT_CLINIC_OR_DEPARTMENT_OTHER)
Admission: RE | Admit: 2015-06-18 | Discharge: 2015-06-18 | Disposition: A | Discharge status: Home / Self Care | Payer: Commercial Managed Care - HMO | Source: Ambulatory Visit | Attending: Obstetrics and Gynecology | Admitting: Obstetrics and Gynecology

## 2015-06-18 ENCOUNTER — Encounter (HOSPITAL_BASED_OUTPATIENT_CLINIC_OR_DEPARTMENT_OTHER): Payer: Self-pay | Admitting: *Deleted

## 2015-06-18 DIAGNOSIS — E119 Type 2 diabetes mellitus without complications: Secondary | ICD-10-CM | POA: Diagnosis not present

## 2015-06-18 DIAGNOSIS — J45909 Unspecified asthma, uncomplicated: Secondary | ICD-10-CM | POA: Insufficient documentation

## 2015-06-18 DIAGNOSIS — N840 Polyp of corpus uteri: Secondary | ICD-10-CM | POA: Insufficient documentation

## 2015-06-18 DIAGNOSIS — M797 Fibromyalgia: Secondary | ICD-10-CM | POA: Diagnosis not present

## 2015-06-18 DIAGNOSIS — N183 Chronic kidney disease, stage 3 (moderate): Secondary | ICD-10-CM | POA: Insufficient documentation

## 2015-06-18 DIAGNOSIS — Z9689 Presence of other specified functional implants: Secondary | ICD-10-CM | POA: Diagnosis not present

## 2015-06-18 DIAGNOSIS — N950 Postmenopausal bleeding: Secondary | ICD-10-CM | POA: Diagnosis present

## 2015-06-18 DIAGNOSIS — I129 Hypertensive chronic kidney disease with stage 1 through stage 4 chronic kidney disease, or unspecified chronic kidney disease: Secondary | ICD-10-CM | POA: Insufficient documentation

## 2015-06-18 DIAGNOSIS — Z79899 Other long term (current) drug therapy: Secondary | ICD-10-CM | POA: Insufficient documentation

## 2015-06-18 DIAGNOSIS — Z6841 Body Mass Index (BMI) 40.0 and over, adult: Secondary | ICD-10-CM | POA: Diagnosis not present

## 2015-06-18 HISTORY — DX: Headache, unspecified: R51.9

## 2015-06-18 HISTORY — DX: Postmenopausal bleeding: N95.0

## 2015-06-18 HISTORY — DX: Headache: R51

## 2015-06-18 HISTORY — DX: Irritable bowel syndrome without diarrhea: K58.9

## 2015-06-18 HISTORY — PX: DILATATION & CURETTAGE/HYSTEROSCOPY WITH MYOSURE: SHX6511

## 2015-06-18 HISTORY — DX: Localized edema: R60.0

## 2015-06-18 LAB — GLUCOSE, CAPILLARY: GLUCOSE-CAPILLARY: 113 mg/dL — AB (ref 65–99)

## 2015-06-18 LAB — POCT I-STAT 4, (NA,K, GLUC, HGB,HCT)
Glucose, Bld: 125 mg/dL — ABNORMAL HIGH (ref 65–99)
HCT: 41 % (ref 36.0–46.0)
HEMOGLOBIN: 13.9 g/dL (ref 12.0–15.0)
POTASSIUM: 3.7 mmol/L (ref 3.5–5.1)
Sodium: 135 mmol/L (ref 135–145)

## 2015-06-18 SURGERY — DILATATION & CURETTAGE/HYSTEROSCOPY WITH MYOSURE
Anesthesia: General | Site: Vagina

## 2015-06-18 MED ORDER — KETOROLAC TROMETHAMINE 30 MG/ML IJ SOLN
INTRAMUSCULAR | Status: DC | PRN
  Administered 2015-06-18: 30 mg via INTRAVENOUS

## 2015-06-18 MED ORDER — LABETALOL HCL 5 MG/ML IV SOLN
INTRAVENOUS | Status: DC | PRN
  Administered 2015-06-18: 5 mg via INTRAVENOUS
  Administered 2015-06-18: 10 mg via INTRAVENOUS
  Administered 2015-06-18: 5 mg via INTRAVENOUS

## 2015-06-18 MED ORDER — CEFOTETAN DISODIUM-DEXTROSE 2-2.08 GM-% IV SOLR
INTRAVENOUS | Status: AC
  Filled 2015-06-18: qty 50

## 2015-06-18 MED ORDER — MEPERIDINE HCL 25 MG/ML IJ SOLN
6.2500 mg | INTRAMUSCULAR | Status: DC | PRN
  Filled 2015-06-18: qty 1

## 2015-06-18 MED ORDER — FENTANYL CITRATE (PF) 100 MCG/2ML IJ SOLN
INTRAMUSCULAR | Status: AC
  Filled 2015-06-18: qty 2

## 2015-06-18 MED ORDER — DEXAMETHASONE SODIUM PHOSPHATE 4 MG/ML IJ SOLN
INTRAMUSCULAR | Status: DC | PRN
  Administered 2015-06-18: 4 mg via INTRAVENOUS

## 2015-06-18 MED ORDER — PROPOFOL 10 MG/ML IV BOLUS
INTRAVENOUS | Status: DC | PRN
  Administered 2015-06-18: 200 mg via INTRAVENOUS

## 2015-06-18 MED ORDER — FENTANYL CITRATE (PF) 100 MCG/2ML IJ SOLN
25.0000 ug | INTRAMUSCULAR | Status: DC | PRN
  Administered 2015-06-18: 50 ug via INTRAVENOUS
  Filled 2015-06-18: qty 1

## 2015-06-18 MED ORDER — SODIUM CHLORIDE 0.9 % IR SOLN
Status: DC | PRN
  Administered 2015-06-18: 3000 mL

## 2015-06-18 MED ORDER — LACTATED RINGERS IV SOLN
INTRAVENOUS | Status: DC
  Administered 2015-06-18 (×2): via INTRAVENOUS
  Filled 2015-06-18: qty 1000

## 2015-06-18 MED ORDER — FENTANYL CITRATE (PF) 100 MCG/2ML IJ SOLN
INTRAMUSCULAR | Status: DC | PRN
  Administered 2015-06-18 (×4): 50 ug via INTRAVENOUS

## 2015-06-18 MED ORDER — FENTANYL CITRATE (PF) 100 MCG/2ML IJ SOLN
INTRAMUSCULAR | Status: AC
  Filled 2015-06-18: qty 4

## 2015-06-18 MED ORDER — LIDOCAINE HCL 1 % IJ SOLN
INTRAMUSCULAR | Status: DC | PRN
  Administered 2015-06-18: 10 mL

## 2015-06-18 MED ORDER — MIDAZOLAM HCL 2 MG/2ML IJ SOLN
INTRAMUSCULAR | Status: AC
  Filled 2015-06-18: qty 2

## 2015-06-18 MED ORDER — MIDAZOLAM HCL 5 MG/5ML IJ SOLN
INTRAMUSCULAR | Status: DC | PRN
  Administered 2015-06-18: 2 mg via INTRAVENOUS

## 2015-06-18 MED ORDER — PROMETHAZINE HCL 25 MG/ML IJ SOLN
6.2500 mg | INTRAMUSCULAR | Status: DC | PRN
  Filled 2015-06-18: qty 1

## 2015-06-18 MED ORDER — DEXTROSE 5 % IV SOLN
2.0000 g | INTRAVENOUS | Status: AC
  Administered 2015-06-18: 2 g via INTRAVENOUS
  Filled 2015-06-18: qty 2

## 2015-06-18 MED ORDER — LIDOCAINE HCL (CARDIAC) 20 MG/ML IV SOLN
INTRAVENOUS | Status: DC | PRN
  Administered 2015-06-18: 40 mg via INTRAVENOUS
  Administered 2015-06-18: 60 mg via INTRAVENOUS

## 2015-06-18 MED ORDER — LACTATED RINGERS IV SOLN
INTRAVENOUS | Status: DC
  Filled 2015-06-18: qty 1000

## 2015-06-18 SURGICAL SUPPLY — 36 items
ABLATOR ENDOMETRIAL BIPOLAR (ABLATOR) IMPLANT
CANISTER SUCTION 2500CC (MISCELLANEOUS) IMPLANT
CATH ROBINSON RED A/P 16FR (CATHETERS) ×2 IMPLANT
COVER BACK TABLE 60X90IN (DRAPES) ×2 IMPLANT
DEVICE MYOSURE CLASSIC (MISCELLANEOUS) IMPLANT
DEVICE MYOSURE LITE (MISCELLANEOUS) IMPLANT
DRAPE HYSTEROSCOPY (DRAPE) ×2 IMPLANT
DRAPE LG THREE QUARTER DISP (DRAPES) ×2 IMPLANT
DRSG TELFA 3X8 NADH (GAUZE/BANDAGES/DRESSINGS) ×2 IMPLANT
GLOVE BIO SURGEON STRL SZ 6.5 (GLOVE) ×5 IMPLANT
GLOVE BIOGEL PI IND STRL 6.5 (GLOVE) IMPLANT
GLOVE BIOGEL PI IND STRL 7.0 (GLOVE) ×1 IMPLANT
GLOVE BIOGEL PI INDICATOR 6.5 (GLOVE) ×1
GLOVE BIOGEL PI INDICATOR 7.0 (GLOVE) ×1
GOWN STRL REUS W/ TWL LRG LVL3 (GOWN DISPOSABLE) ×1 IMPLANT
GOWN STRL REUS W/ TWL XL LVL3 (GOWN DISPOSABLE) ×1 IMPLANT
GOWN STRL REUS W/TWL LRG LVL3 (GOWN DISPOSABLE) ×4
GOWN STRL REUS W/TWL XL LVL3 (GOWN DISPOSABLE)
IV NS IRRIG 3000ML ARTHROMATIC (IV SOLUTION) ×2 IMPLANT
LEGGING LITHOTOMY PAIR STRL (DRAPES) ×2 IMPLANT
LOOP ANGLED CUTTING 22FR (CUTTING LOOP) IMPLANT
MANIFOLD NEPTUNE II (INSTRUMENTS) IMPLANT
MYOSURE LITE POLYP REMOVAL (MISCELLANEOUS) ×1 IMPLANT
NDL SPNL 22GX3.5 QUINCKE BK (NEEDLE) IMPLANT
NEEDLE SPNL 22GX3.5 QUINCKE BK (NEEDLE) ×2 IMPLANT
PACK BASIN DAY SURGERY FS (CUSTOM PROCEDURE TRAY) ×2 IMPLANT
PAD DRESSING TELFA 3X8 NADH (GAUZE/BANDAGES/DRESSINGS) IMPLANT
PAD OB MATERNITY 4.3X12.25 (PERSONAL CARE ITEMS) ×2 IMPLANT
PAD PREP 24X48 CUFFED NSTRL (MISCELLANEOUS) ×2 IMPLANT
SEAL ROD LENS SCOPE MYOSURE (ABLATOR) IMPLANT
SYR CONTROL 10ML LL (SYRINGE) ×1 IMPLANT
TOWEL OR 17X24 6PK STRL BLUE (TOWEL DISPOSABLE) ×4 IMPLANT
TRAY DSU PREP LF (CUSTOM PROCEDURE TRAY) ×2 IMPLANT
TUBING AQUILEX INFLOW (TUBING) ×1 IMPLANT
TUBING AQUILEX OUTFLOW (TUBING) ×1 IMPLANT
WATER STERILE IRR 500ML POUR (IV SOLUTION) ×2 IMPLANT

## 2015-06-18 NOTE — Anesthesia Procedure Notes (Signed)
Procedure Name: LMA Insertion Date/Time: 06/18/2015 8:31 AM Performed by: Mechele Claude Pre-anesthesia Checklist: Patient identified, Emergency Drugs available, Suction available and Patient being monitored Patient Re-evaluated:Patient Re-evaluated prior to inductionOxygen Delivery Method: Circle System Utilized Preoxygenation: Pre-oxygenation with 100% oxygen Intubation Type: IV induction Ventilation: Mask ventilation without difficulty LMA: LMA inserted LMA Size: 4.0 Number of attempts: 1 Airway Equipment and Method: bite block Placement Confirmation: positive ETCO2 Tube secured with: Tape Dental Injury: Teeth and Oropharynx as per pre-operative assessment

## 2015-06-18 NOTE — Transfer of Care (Signed)
Last Vitals:  Filed Vitals:   06/18/15 0716  BP: 126/81  Pulse: 82  Temp: 36.9 C  Resp: 18    Immediate Anesthesia Transfer of Care Note  Patient: Grace Singh  Procedure(s) Performed: Procedure(s) (LRB): DILATATION & CURETTAGE/HYSTEROSCOPY WITH MYOSURE (N/A)  Patient Location: PACU  Anesthesia Type: General  Level of Consciousness: awake, alert  and oriented  Airway & Oxygen Therapy: Patient Spontanous Breathing and Patient connected to face mask oxygen  Post-op Assessment: Report given to PACU RN and Post -op Vital signs reviewed and stable  Post vital signs: Reviewed and stable  Complications: No apparent anesthesia complications

## 2015-06-18 NOTE — Discharge Instructions (Signed)
FU office 2-3 weeks for postop appointment.  Call the office [phone] for an appointment. ° °Personal Hygiene: °Use pads not tampons x 1week °You may shower, no tub baths or pools for 2-3 weeks °Wipe from front to back when using restroom ° °Activity: °Do not drive or operate any equipment for 24 hrs.   °Do not rest in bed all day °Walking is encouraged °Walk up and down stairs slowly °You may return to your normal activity in 1-2 days ° °Sexual Activity:  No intercourse for 2 weeks after the procedure. ° °Diet: Eat a light meal as desired this evening.  You may resume your usual diet tomorrow. ° °Return to work:  You may resume your work activities after 1-2 days ° °What to expect:  Expect to have vaginal bleeding/discharge for 2-3 days and spotting for 10-14 days.  It is not unusual to have soreness for 1-2 weeks.  You may have a slight burning sensation when you urinate for the first few days.  You may start your menses in 2-6 weeks.  Mild cramps may continue for a couple of days.   ° °Call your doctor:   °Excessive bleeding, saturating a pad every hour °Inability to urinate 6 hours after discharge °Pain not relieved with pain medications °Fever of 100.4 or greater ° ° °Post Anesthesia Home Care Instructions ° °Activity: °Get plenty of rest for the remainder of the day. A responsible adult should stay with you for 24 hours following the procedure.  °For the next 24 hours, DO NOT: °-Drive a car °-Operate machinery °-Drink alcoholic beverages °-Take any medication unless instructed by your physician °-Make any legal decisions or sign important papers. ° °Meals: °Start with liquid foods such as gelatin or soup. Progress to regular foods as tolerated. Avoid greasy, spicy, heavy foods. If nausea and/or vomiting occur, drink only clear liquids until the nausea and/or vomiting subsides. Call your physician if vomiting continues. ° °Special Instructions/Symptoms: °Your throat may feel dry or sore from the anesthesia or  the breathing tube placed in your throat during surgery. If this causes discomfort, gargle with warm salt water. The discomfort should disappear within 24 hours. ° °If you had a scopolamine patch placed behind your ear for the management of post- operative nausea and/or vomiting: ° °1. The medication in the patch is effective for 72 hours, after which it should be removed.  Wrap patch in a tissue and discard in the trash. Wash hands thoroughly with soap and water. °2. You may remove the patch earlier than 72 hours if you experience unpleasant side effects which may include dry mouth, dizziness or visual disturbances. °3. Avoid touching the patch. Wash your hands with soap and water after contact with the patch. °  ° °

## 2015-06-18 NOTE — Anesthesia Postprocedure Evaluation (Signed)
  Anesthesia Post-op Note  Patient: Grace Singh  Procedure(s) Performed: Procedure(s) (LRB): DILATATION & CURETTAGE/HYSTEROSCOPY WITH MYOSURE (N/A)  Patient Location: PACU  Anesthesia Type: General  Level of Consciousness: awake and alert   Airway and Oxygen Therapy: Patient Spontanous Breathing  Post-op Pain: mild  Post-op Assessment: Post-op Vital signs reviewed, Patient's Cardiovascular Status Stable, Respiratory Function Stable, Patent Airway and No signs of Nausea or vomiting  Last Vitals:  Filed Vitals:   06/18/15 1050  BP: 128/63  Pulse: 74  Temp: 36.8 C  Resp: 14    Post-op Vital Signs: stable   Complications: No apparent anesthesia complications

## 2015-06-18 NOTE — Op Note (Signed)
NAMECHRISTE, Grace Singh              ACCOUNT NO.:  [id number]  MEDICAL RECORD NO.:  [id number]  LOCATION:                                 FACILITY:  PHYSICIAN:  [name], MD    DATE OF BIRTH:  09-09-1966  DATE OF PROCEDURE: DATE OF DISCHARGE:                              OPERATIVE REPORT   PREOPERATIVE DIAGNOSES: 1. Irregular bleeding. 2. Suspect endometrial polyp.  POSTOPERATIVE DIAGNOSES: 1. Irregular bleeding. 2. Suspect endometrial polyp.  PROCEDURES: 1. Paracervical block. 2. Hysteroscopy D and C. 3. Resection of endometrial polyp.  SURGEON:  [name], MD.  ANESTHESIA:  General.  SPECIMEN:  Endometrial curettings with polyp.  COMPLICATIONS:  None.  CONDITION:  Stable to recovery room.  PROCEDURE IN DETAIL:  The patient was taken to the operating room after informed consent was obtained.  She was given general anesthesia, placed in the dorsal lithotomy position using Allen stirrups.  She was prepped and draped in sterile fashion.  In-and-out catheter was used to drain her bladder for an unmeasured amount of urine.  Bivalve speculum was placed in the vagina, and a single-tooth tenaculum was attached to the anterior lip of the cervix.  A 10 mL of 1% lidocaine was used to perform a paracervical block.  Cervix was serially dilated using Pratt dilators, and the hysteroscope was inserted.  A survey was performed.  Polypoid endometrial tissue was noted throughout.  MyoSure LITE was inserted, and polypoid mass was resected to the base.  Hysteroscope was then removed, and a gentle curetting was performed.  Specimen was placed on Telfa and passed off to be sent to Pathology.  Hysteroscope was reintroduced.  No other masses or abnormalities were noted throughout the cavity. All instruments were removed.  The tenaculum was removed.  The cervix was hemostatic.  Speculum was removed.  She was extubated and taken to the recovery room in stable condition.  Sponge,  lap, needle, and instrument counts were correct x2.     [name], MD     GA/MEDQ  D:  06/18/2015  T:  06/18/2015  Job:  [id number]

## 2015-06-18 NOTE — Anesthesia Preprocedure Evaluation (Signed)
Anesthesia Evaluation  Patient identified by MRN, date of birth, ID band Patient awake    Reviewed: Allergy & Precautions, H&P , NPO status , Patient's Chart, lab work & pertinent test results  Airway Mallampati: III  TM Distance: <3 FB Neck ROM: Full    Dental no notable dental hx. (+) Teeth Intact, Caps,    Pulmonary asthma ,  breath sounds clear to auscultation  + decreased breath sounds      Cardiovascular hypertension, Pt. on medications Rhythm:Regular Rate:Normal     Neuro/Psych Anxiety Depression Spinal cord stimulator  Neuromuscular disease negative neurological ROS  negative psych ROS   GI/Hepatic negative GI ROS, Neg liver ROS,   Endo/Other  diabetes, Well Controlled, Type 2, Oral Hypoglycemic AgentsMorbid obesity  Renal/GU Renal InsufficiencyRenal disease  negative genitourinary   Musculoskeletal negative musculoskeletal ROS (+) Fibromyalgia -  Abdominal   Peds negative pediatric ROS (+)  Hematology negative hematology ROS (+)   Anesthesia Other Findings   Reproductive/Obstetrics negative OB ROS                             Anesthesia Physical  Anesthesia Plan  ASA: III  Anesthesia Plan: General   Post-op Pain Management:    Induction: Intravenous  Airway Management Planned: LMA  Additional Equipment:   Intra-op Plan:   Post-operative Plan:   Informed Consent: I have reviewed the patients History and Physical, chart, labs and discussed the procedure including the risks, benefits and alternatives for the proposed anesthesia with the patient or authorized representative who has indicated his/her understanding and acceptance.   Dental advisory given  Plan Discussed with: CRNA  Anesthesia Plan Comments:         Anesthesia Quick Evaluation

## 2015-06-23 ENCOUNTER — Encounter (HOSPITAL_BASED_OUTPATIENT_CLINIC_OR_DEPARTMENT_OTHER): Payer: Self-pay | Admitting: Obstetrics and Gynecology

## 2016-03-12 DIAGNOSIS — E785 Hyperlipidemia, unspecified: Secondary | ICD-10-CM | POA: Insufficient documentation

## 2016-03-12 DIAGNOSIS — I10 Essential (primary) hypertension: Secondary | ICD-10-CM | POA: Insufficient documentation

## 2016-04-21 DIAGNOSIS — E781 Pure hyperglyceridemia: Secondary | ICD-10-CM | POA: Insufficient documentation

## 2016-07-14 ENCOUNTER — Other Ambulatory Visit: Payer: Self-pay | Admitting: Obstetrics and Gynecology

## 2016-07-14 DIAGNOSIS — Z1231 Encounter for screening mammogram for malignant neoplasm of breast: Secondary | ICD-10-CM

## 2016-07-19 ENCOUNTER — Ambulatory Visit
Admission: RE | Admit: 2016-07-19 | Discharge: 2016-07-19 | Disposition: A | Discharge status: Home / Self Care | Payer: Commercial Managed Care - HMO | Source: Ambulatory Visit | Attending: Obstetrics and Gynecology | Admitting: Obstetrics and Gynecology

## 2016-07-19 DIAGNOSIS — Z1231 Encounter for screening mammogram for malignant neoplasm of breast: Secondary | ICD-10-CM

## 2016-09-28 ENCOUNTER — Ambulatory Visit: Payer: Self-pay | Admitting: Surgery

## 2016-09-28 NOTE — H&P (Signed)
Grace Singh 09/28/2016 10:07 AM Location: [facility] Patient #: [id number] DOB: June 07, 1966 Married / Language: [name] / Race: White Female  History of Present Illness [name] MD; 09/28/2016 11:06 AM) The patient is a 50 year old female who presents with a gastrointestinal foreign body. Note for "Gastrointestinal foreign body": Patient sent for surgical consultation requested Dr. Aletha Halim outlaw and [name] with gastroenterology. Concern for gastric mass. 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  Pleasant 50 year old female. She comes today with her husband and granddaughter. They're babysitting day. Diabetic on insulin. Nonsmoker. Morbidly obese. Has chronic lumbar back and sacrococcygeal pain on chronic pain medications as well as spinal stimulator. On chronic narcotics. Has strong family history of unknown etiology cancers. Brother died of metastatic disease in his 21s. Another brother on hospice. She's never regimen testing.  Regardless she's had numerous abdominal complaints for her life. Has had a lot of loose stools and diarrhea. 2-10 bowel movements a day at times. Had cholecystectomy age 30. Appendectomy age 62. She has some fibroids for which she's had D&Cs done. History of kidney stones treated by Dr. [name] with [facility]. No recent stones. Diagnosed with irritable bowel syndrome. Improved with the help of gastroenterology. Sounds like Lomotil is helping. Has had upper abdominal intermittent crampy abdominal complaints as well. Early satiety and fullness. Bloating. Occasional nausea or vomiting. Poor tolerance to spicy foods. Prilosec somewhat helps. Intermittently on that. Seems to help heartburn or reflux. Underwent upper and lower endoscopy. Colonoscopy rather underwhelming with just a polyp upper endoscopy showed some active gastritis and Helicobacter pylori. Treated. Also had a submucosal nodule. About 4 cm. Referred  to [facility] gastroenterology for endoscopic ultrasound. Biopsy shows spindle cell neoplasm. Looks like it's located on the posterior wall of the mid body versus the antrum. Surgical consultation requested.  No personal nor family history of colon cancer, inflammatory bowel disease, allergy such as Celiac Sprue, dietary/dairy problems, colitis, ulcers nor gastritis. No recent sick contacts/gastroenteritis. No travel outside the country. No changes in diet. No dysphagia to solids or liquids. No hematochezia, hematemesis, coffee ground emesis. No evidence of prior gastric/peptic ulceration. Mother had breast cancer. Get screened rather regularly. Has never seen genetic counseling. She is morbidly obese. She's had some intermittent unintentional weight loss. Been usually can gain it back gradually. Hemoglobin A1c around 6. Sugars around the 120s. Followed by cardiologist for hyperlipidemia. Gets tired after walking about 10/15 minutes. Most of it is related to her severe tailbone and lower back pain. Claims she's had negative cardiac workups for any cardiopulmonary issues. She does not smoke.   Other Problems [name], CMA; 09/28/2016 10:08 AM) Anxiety Disorder Arthritis Asthma Back Pain Cholelithiasis Depression Diabetes Mellitus Gastroesophageal Reflux Disease Hemorrhoids High blood pressure Hypercholesterolemia Kidney Stone Other disease, cancer, significant illness  Past Surgical History [name], CMA; 09/28/2016 10:08 AM) Appendectomy Gallbladder Surgery - Laparoscopic Oral Surgery Spinal Surgery - Lower Back  Diagnostic Studies History [name], CMA; 09/28/2016 10:08 AM) Colonoscopy within last year Mammogram within last year Pap Smear 1-5 years ago  Allergies [name], CMA; 09/28/2016 10:08 AM) No Known Drug Allergies 09/28/2016  Medication History [name], CMA; 09/28/2016 10:09 AM) Vascepa (1GM Capsule, Oral)  Active. Topiramate (50MG  Tablet, Oral) Active. TiZANidine HCl (4MG  Tablet, Oral) Active. Pioglitazone HCl (45MG  Tablet, Oral) Active. Omeprazole (40MG  Capsule DR, Oral) Active. MetFORMIN HCl (1000MG  Tablet, Oral) Active. Lantus SoloStar (100UNIT/ML Soln Pen-inj, Subcutaneous) Active. Lisinopril (20MG  Tablet, Oral) Active. LamoTRIgine (200MG  Tablet, Oral) Active. HydroCHLOROthiazide (25MG  Tablet,  Oral) Active. Gemfibrozil (600MG  Tablet, Oral) Active. Clarithromycin (500MG  Tablet, Oral) Active. Citalopram Hydrobromide (40MG  Tablet, Oral) Active. BuPROPion HCl (75MG  Tablet, Oral) Active. BD Pen Needle Nano U/F (32G X 4 MM Misc,) Active. Sucralfate (1GM Tablet, Oral) Active. Rosuvastatin Calcium (20MG  Tablet, Oral) Active. Omega-3-acid Ethyl Esters (1GM Capsule, Oral) Active. Ondansetron (4MG  Tablet Disint, Oral) Active. Albuterol Sulfate HFA (108 (90 Base)MCG/ACT Aerosol Soln, Inhalation) Active. Medications Reconciled  Social History [name], [place]; 09/28/2016 10:08 AM) Alcohol use Occasional alcohol use. Caffeine use Carbonated beverages. No drug use Tobacco use Never smoker.  Family History [name], [place]; 09/28/2016 10:08 AM) Arthritis Father, Mother. Breast Cancer Mother. Cancer Brother, Family Members In General. Cerebrovascular Accident Father. Colon Cancer Brother, Family Members In General. Diabetes Mellitus Family Members In General, Father. Heart Disease Father. Heart disease in female family member before age 90 Hypertension Brother, Family Members In [place], Father. Ischemic Bowel Disease Brother, Family Members In General. Kidney Disease Family Members In General. Migraine Headache Brother. Rectal Cancer Brother.  Pregnancy / Birth History [name], CMA; 09/28/2016 10:08 AM) Age at menarche 47 years. Age of menopause 65-50 Gravida 0 Para 0     Review of Systems [name] CMA; 09/28/2016 10:08  AM) General Present- Fatigue, Weight Gain and Weight Loss. Not Present- Appetite Loss, Chills, Fever and Night Sweats. Skin Not Present- Change in Wart/Mole, Dryness, Hives, Jaundice, New Lesions, Non-Healing Wounds, Rash and Ulcer. HEENT Present- Hoarseness, Seasonal Allergies and Wears glasses/contact lenses. Not Present- Earache, Hearing Loss, Nose Bleed, Oral Ulcers, Ringing in the Ears, Sinus Pain, Sore Throat, Visual Disturbances and Yellow Eyes. Respiratory Not Present- Bloody sputum, Chronic Cough, Difficulty Breathing, Snoring and Wheezing. Breast Not Present- Breast Mass, Breast Pain, Nipple Discharge and Skin Changes. Cardiovascular Present- Swelling of Extremities. Not Present- Chest Pain, Difficulty Breathing Lying Down, Leg Cramps, Palpitations, Rapid Heart Rate and Shortness of Breath. Gastrointestinal Present- Abdominal Pain, Chronic diarrhea, Constipation, Gets full quickly at meals, Indigestion, Nausea, Rectal Pain and Vomiting. Not Present- Bloating, Bloody Stool, Change in Bowel Habits, Difficulty Swallowing, Excessive gas and Hemorrhoids. Female Genitourinary Present- Frequency and Nocturia. Not Present- Painful Urination, Pelvic Pain and Urgency. Musculoskeletal Present- Back Pain, Joint Pain, Joint Stiffness, Muscle Pain and Swelling of Extremities. Not Present- Muscle Weakness. Psychiatric Present- Anxiety, Bipolar and Depression. Not Present- Change in Sleep Pattern, Fearful and Frequent crying. Endocrine Not Present- Cold Intolerance, Excessive Hunger, Hair Changes, Heat Intolerance, Hot flashes and New Diabetes. Hematology Not Present- Blood Thinners, Easy Bruising, Excessive bleeding, Gland problems, HIV and Persistent Infections.  Vitals [name] CMA; 09/28/2016 10:10 AM) 09/28/2016 10:09 AM Weight: 262 lb Height: 66in Body Surface Area: 2.24 m Body Mass Index: 42.29 kg/m  Temp.: 98.60F(Temporal)  Pulse: 72 (Regular)  BP: 134/72 (Sitting, Left Arm,  Standard)      Physical Exam [name] MD; 09/28/2016 11:08 AM)  General Mental Status-Alert. General Appearance-Not in acute distress, Not Sickly. Orientation-Oriented X3. Hydration-Well hydrated. Voice-Normal. Note: Nontoxic. Not sickly.  Integumentary Global Assessment Upon inspection and palpation of skin surfaces of the - Axillae: non-tender, no inflammation or ulceration, no drainage. and Distribution of scalp and body hair is normal. General Characteristics Temperature - normal warmth is noted.  Head and Neck Head-normocephalic, atraumatic with no lesions or palpable masses. Face Global Assessment - atraumatic, no absence of expression. Neck Global Assessment - no abnormal movements, no bruit auscultated on the right, no bruit auscultated on the left, no decreased range of motion, non-tender. Trachea-midline. Thyroid Gland Characteristics - non-tender.  Eye Eyeball - Left-Extraocular movements intact, No Nystagmus. Eyeball - Right-Extraocular movements intact, No Nystagmus. Cornea - Left-No Hazy. Cornea - Right-No Hazy. Sclera/Conjunctiva - Left-No scleral icterus, No Discharge. Sclera/Conjunctiva - Right-No scleral icterus, No Discharge. Pupil - Left-Direct reaction to light normal. Pupil - Right-Direct reaction to light normal.  ENMT Ears Pinna - Left - no drainage observed, no generalized tenderness observed. Right - no drainage observed, no generalized tenderness observed. Nose and Sinuses External Inspection of the Nose - no destructive lesion observed. Inspection of the nares - Left - quiet respiration. Right - quiet respiration. Mouth and Throat Lips - Upper Lip - no fissures observed, no pallor noted. Lower Lip - no fissures observed, no pallor noted. Nasopharynx - no discharge present. Oral Cavity/Oropharynx - Tongue - no dryness observed. Oral Mucosa - no cyanosis observed. Hypopharynx - no evidence of airway  distress observed.  Chest and Lung Exam Inspection Movements - Normal and Symmetrical. Accessory muscles - No use of accessory muscles in breathing. Palpation Palpation of the chest reveals - Non-tender. Auscultation Breath sounds - Normal and Clear.  Cardiovascular Auscultation Rhythm - Regular. Murmurs & Other Heart Sounds - Auscultation of the heart reveals - No Murmurs and No Systolic Clicks.  Abdomen Inspection Inspection of the abdomen reveals - No Visible peristalsis and No Abnormal pulsations. Umbilicus - No Bleeding, No Urine drainage. Palpation/Percussion Palpation and Percussion of the abdomen reveal - Soft, Non Tender, No Rebound tenderness, No Rigidity (guarding) and No Cutaneous hyperesthesia. Note: Morbidly obese but soft. Some left upper quadrant discomfort to light palpation but no guarding or peritonitis. No epigastric or right upper quadrant pain. No pain in lower abdomen. Well-healed laparoscopic incisions without diastases or hernia. No umbilical hernia. No guarding.  Female Genitourinary Sexual Maturity Tanner 5 - Adult hair pattern. Note: No inguinal hernias. No inguinal lymphadenopathy.  Rectal Note: Deferred given colonoscopy done in August  Peripheral Vascular Upper Extremity Inspection - Left - No Cyanotic nailbeds, Not Ischemic. Right - No Cyanotic nailbeds, Not Ischemic.  Neurologic Neurologic evaluation reveals -normal attention span and ability to concentrate, able to name objects and repeat phrases. Appropriate fund of knowledge , normal sensation and normal coordination. Mental Status Affect - not angry, not paranoid. Cranial Nerves-Normal Bilaterally. Gait-Normal.  Neuropsychiatric Mental status exam performed with findings of-able to articulate well with normal speech/language, rate, volume and coherence, thought content normal with ability to perform basic computations and apply abstract reasoning and no evidence of  hallucinations, delusions, obsessions or homicidal/suicidal ideation. Note: Calm. Conversational. Not tearful. Not belligerent.  Musculoskeletal Global Assessment Spine, Ribs and Pelvis - no instability, subluxation or laxity. Right Upper Extremity - no instability, subluxation or laxity. Note: Sensitive and lumbar and specially sacral area. Has to sit on a cushion/doughnut.  Lymphatic Head & Neck  General Head & Neck Lymphatics: Bilateral - Description - No Localized lymphadenopathy. Axillary  General Axillary Region: Bilateral - Description - No Localized lymphadenopathy. Femoral & Inguinal  Generalized Femoral & Inguinal Lymphatics: Left - Description - No Localized lymphadenopathy. Right - Description - No Localized lymphadenopathy.    Assessment & Plan [name] MD; 09/28/2016 10:51 AM)  NAUSEA AND VOMITING IN ADULT (R11.2) Impression: Occasional nausea vomiting with history of gastritis and Helicobacter pylori infection. Treated for that. Using Prilosec. Recommend that she stay on that regularly.  I think she is at moderate high risk for having delayed gastric emptying/gastroparesis between her diabetes and her narcotic use and spine/chronic pain issues. Would like to get  a gastric emptying study. I noted is not ideal with her on narcotics but I think it is more realistic to have it done while she is on her medications to see how well that functions. If that is severely affected, she may need further interventions depending with gastroenterology thinks.  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 I think she would benefit from wedge resection. Reasonable do a minimally invasive laparoscopic/robotic approach. Would like to do an interoperative endoscopy as well to help localize the  region wedge off.  I did caution him that usually these things are rather asymptomatic one small and doing surgery is not going to radically improve her abdominal complaints. However think is a good idea get new this before becomes more serious problem and then regroup.  Hopefully or catching early enough that just surgery is needed and not post-adjuvant Gleevec spelled most likely will discussed at tumor board postoperatively.  Given the fact that she has 1 sibling that died of metastatic disease in his [id number] of uncertain etiology another one that I think has it as well, I think she would benefit from genetic testing. Try and see if we can help set that up. See if he can be done now vs due to through the tumor board once the surgeries done.  Current Plans I recommended obtaining preoperative cardiac clearance. I am concerned about the health of the patient and the ability to tolerate the operation. Therefore, we will request clearance by cardiology to better assess operative risk & see if a reevaluation, further workup, etc is needed. Also recommendations on how medications such as for anticoagulation and blood pressure should be managed/held/restarted after surgery. The anatomy & physiology of the foregut and anti-reflux mechanism was discussed. The pathophysiology of Gastrointestinal stromal tumors (GISTs) and differential diagnosis was discussed. Natural history risks without surgery was discussed. The patient's symptoms are not adequately controlled by medicines and other non-operative treatments. I feel the risks of no intervention will lead to serious problems that outweigh the operative risks; therefore, I recommended surgery to remove the mass. Most likely involving partial gastrectomy wedge resection. Need for a thorough workup to rule out the differential diagnosis and plan treatment was explained. I explained laparoscopic techniques with possible need for an open approach. I will need to remove  the mass en bloc. Therefore I will need extraction incision. Possible hand assist port as well.  Risks such as bleeding, infection, abscess, leak, need for further treatment, heart attack, death, and other risks were discussed. I noted a good likelihood this will help address the problem. Goals of post-operative recovery were discussed as well. Possibility that this will not correct all symptoms was explained. Post-operative dysphagia, need for short-term liquid & pureed diet, possible need for medicines to help control symptoms in addition to surgery were discussed. We will work to minimize complications. Possible need for Gleevec our Sutent postoperatively depending on the aggressiveness of the tumor was discussed as well. That would involve medical oncology consultation. Educational handouts further explaining the pathology, treatment options was given as well. Questions were answered. The patient expressed understanding & wished to proceed with surgery.  You are being scheduled for surgery - Our schedulers will call you.  You should hear from our office's scheduling department within 5 working days about the location, date, and time of  surgery. We try to make accommodations for patient's preferences in scheduling surgery, but sometimes the OR schedule or the surgeon's schedule prevents [place] from making those accommodations.  If you have not heard from our office [phone]) in 5 working days, call the office and ask for your surgeon's nurse.  If you have other questions about your diagnosis, plan, or surgery, call the office and ask for your surgeon's nurse.  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  Sounds like she's had some improvement on some medication. I think it may be Lomotil since her main complaints were diarrhea based. Narcotics are somewhat constipating as  well.  They be related to be on some type of fiber bowel regimen such as MiraLAX to help stabilize things and follow closely with gastroenterology. Sounds like it still rather a challenge for her beginning easier to manage.  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 Gastrointestinal and Minimally Invasive Surgery [facility] [address] [place], [place] [address] [phone] Main / Paging

## 2016-09-29 ENCOUNTER — Other Ambulatory Visit (HOSPITAL_COMMUNITY): Payer: Self-pay | Admitting: Surgery

## 2016-09-29 DIAGNOSIS — R112 Nausea with vomiting, unspecified: Secondary | ICD-10-CM

## 2016-10-07 ENCOUNTER — Telehealth: Payer: Self-pay | Admitting: Genetic Counselor

## 2016-10-07 ENCOUNTER — Encounter: Payer: Self-pay | Admitting: Genetic Counselor

## 2016-10-07 DIAGNOSIS — Z6841 Body Mass Index (BMI) 40.0 and over, adult: Secondary | ICD-10-CM

## 2016-10-07 NOTE — Telephone Encounter (Signed)
Pt confirmed appt, verified demo and insurance, mailed pt letter, in basket referring provider appt date/time. °

## 2016-10-08 ENCOUNTER — Encounter (HOSPITAL_COMMUNITY)
Admission: RE | Admit: 2016-10-08 | Discharge: 2016-10-08 | Disposition: A | Discharge status: Home / Self Care | Payer: Commercial Managed Care - HMO | Source: Ambulatory Visit | Attending: Surgery | Admitting: Surgery

## 2016-10-08 DIAGNOSIS — R112 Nausea with vomiting, unspecified: Secondary | ICD-10-CM

## 2016-10-08 MED ORDER — TECHNETIUM TC 99M SULFUR COLLOID
2.0000 | Freq: Once | INTRAVENOUS | Status: AC | PRN
Start: 2016-10-08 — End: 2016-10-08
  Administered 2016-10-08: 2 via ORAL

## 2016-10-19 ENCOUNTER — Telehealth: Payer: Self-pay | Admitting: *Deleted

## 2016-10-19 NOTE — Telephone Encounter (Signed)
Oncology Nurse Navigator Documentation  Oncology Nurse Navigator Flowsheets 10/19/2016  Navigator Location [facility]  Referral date to RadOnc/MedOnc 11/01/2016  Navigator Encounter Type Telephone  Telephone Outgoing Call  Abnormal Finding Date 09/09/2016  Confirmed Diagnosis Date 09/09/2016  Barriers/Navigation Needs Coordination of Care--instructed her to call her cardiologist to make [facility] aware of the stress test results so her surgery can get scheduled.  Interventions Coordination of Care-notified [facility] that she had her stress test last week   Acuity Level 1  Time Spent with Patient 15  Called patient to determine if she has had her stress test done yet and she reports it was last week and she has not heard from her cardiologist yet. Encouraged her to call them today for results and to get this forwarded to [facility] who is waiting on this to schedule her surgery. She understands and agrees. Informed her she will be scheduled with medical oncology after her surgery date is determined.

## 2016-10-25 ENCOUNTER — Encounter: Payer: Self-pay | Admitting: Hematology

## 2016-11-16 ENCOUNTER — Telehealth: Payer: Self-pay | Admitting: Hematology

## 2016-11-16 ENCOUNTER — Encounter: Payer: Self-pay | Admitting: Genetic Counselor

## 2016-11-16 DIAGNOSIS — C49A2 Gastrointestinal stromal tumor of stomach: Secondary | ICD-10-CM | POA: Insufficient documentation

## 2016-11-16 NOTE — Telephone Encounter (Signed)
Lt mess with [facility]) pcp to obtain Craig Staggers for Dec and Jan appt.

## 2016-11-17 ENCOUNTER — Other Ambulatory Visit: Payer: Commercial Managed Care - HMO

## 2016-11-17 ENCOUNTER — Encounter: Payer: Commercial Managed Care - HMO | Admitting: Genetic Counselor

## 2016-11-17 ENCOUNTER — Telehealth: Payer: Self-pay | Admitting: Genetic Counselor

## 2016-11-17 NOTE — Telephone Encounter (Signed)
Due to insurance auth pt decided to wait until new year to schedule genetic counseling appt and will contact office when ready.

## 2016-11-22 NOTE — Patient Instructions (Addendum)
Grace Singh  11/22/2016   Your procedure is scheduled on: Friday 12/03/2016  Report to [facility] Main  Entrance take [name]  elevators to 3rd floor to  [place] at [place] AM.  Call this number if you have problems the morning of surgery [phone]   Remember: ONLY 1 PERSON MAY GO WITH YOU TO SHORT STAY TO GET  READY MORNING OF [place].        How to Manage Your Diabetes Before and After Surgery  Why is it important to control my blood sugar before and after surgery? . Improving blood sugar levels before and after surgery helps healing and can limit problems. . A way of improving blood sugar control is eating a healthy diet by: o  Eating less sugar and carbohydrates o  Increasing activity/exercise o  Talking with your doctor about reaching your blood sugar goals . High blood sugars (greater than 180 mg/dL) can raise your risk of infections and slow your recovery, so you will need to focus on controlling your diabetes during the weeks before surgery. . Make sure that the doctor who takes care of your diabetes knows about your planned surgery including the date and location.  How do I manage my blood sugar before surgery? . Check your blood sugar at least 4 times a day, starting 2 days before surgery, to make sure that the level is not too high or low. o Check your blood sugar the morning of your surgery when you wake up and every 2 hours until you get to the Short Stay unit. . If your blood sugar is less than 70 mg/dL, you will need to treat for low blood sugar: o Do not take insulin. o Treat a low blood sugar (less than 70 mg/dL) with  cup of clear juice (cranberry or apple), 4 glucose tablets, OR glucose gel. o Recheck blood sugar in 15 minutes after treatment (to make sure it is greater than 70 mg/dL). If your blood sugar is not greater than 70 mg/dL on recheck, call [phone] for further instructions. . Report your blood sugar to the  short stay nurse when you get to Short Stay.  . If you are admitted to the hospital after surgery: o Your blood sugar will be checked by the staff and you will probably be given insulin after surgery (instead of oral diabetes medicines) to make sure you have good blood sugar levels. o The goal for blood sugar control after surgery is 80-180 mg/dL.   WHAT DO I DO ABOUT MY DIABETES MEDICATION?  Marland Kitchen Do not take oral diabetes medicines (pills) the morning of surgery.! .   . THE NIGHT BEFORE SURGERY, take ______30_____ units of __Lantus_________insulin.       . THE MORNING OF SURGERY, take _______0______ units of _____Lantus_____insulin. .   . The day of surgery, do not take other diabetes injectables, including Byetta (exenatide), Bydureon (exenatide ER), Victoza (liraglutide), or Trulicity (dulaglutide).      DO NOT EAT FOOD OR LIQUIDS AFTER MIDNIGHT!     Take these medicines the morning of surgery with A SIP OF WATER: Bupropion (Wellbutrin), Citalopram (Celexa), Topiramate (Topamax)                                 You may not have any metal on your body including hair pins  and              piercings  Do not wear jewelry, make-up, lotions, powders or perfumes, deodorant             Do not wear nail polish.  Do not shave  48 hours prior to surgery.              Men may shave face and neck.   Do not bring valuables to the hospital. [place].  Contacts, dentures or bridgework may not be worn into surgery.  Leave suitcase in the car. After surgery it may be brought to your room.                  Please read over the following fact sheets you were given: _____________________________________________________________________   [facility] - Preparing for Surgery Before surgery, you can play an important role.  Because skin is not sterile, your skin needs to be as free of germs as possible.  You can reduce the number of germs on your  skin by washing with CHG (chlorahexidine gluconate) soap before surgery.  CHG is an antiseptic cleaner which kills germs and bonds with the skin to continue killing germs even after washing. Please DO NOT use if you have an allergy to CHG or antibacterial soaps.  If your skin becomes reddened/irritated stop using the CHG and inform your nurse when you arrive at Short Stay. Do not shave (including legs and underarms) for at least 48 hours prior to the first CHG shower.  You may shave your face/neck. Please follow these instructions carefully:  1.  Shower with CHG Soap the night before surgery and the  morning of Surgery.  2.  If you choose to wash your hair, wash your hair first as usual with your  normal  shampoo.  3.  After you shampoo, rinse your hair and body thoroughly to remove the  shampoo.                           4.  Use CHG as you would any other liquid soap.  You can apply chg directly  to the skin and wash                       Gently with a scrungie or clean washcloth.  5.  Apply the CHG Soap to your body ONLY FROM THE NECK DOWN.   Do not use on face/ open                           Wound or open sores. Avoid contact with eyes, ears mouth and genitals (private parts).                       Wash face,  Genitals (private parts) with your normal soap.             6.  Wash thoroughly, paying special attention to the area where your surgery  will be performed.  7.  Thoroughly rinse your body with warm water from the neck down.  8.  DO NOT shower/wash with your normal soap after using and rinsing off  the CHG Soap.  9.  Pat yourself dry with a clean towel.            10.  Wear clean pajamas.            11.  Place clean sheets on your bed the night of your first shower and do not  sleep with pets. Day of Surgery : Do not apply any lotions/deodorants the morning of surgery.  Please wear clean clothes to the hospital/surgery center.  FAILURE TO FOLLOW THESE INSTRUCTIONS MAY  RESULT IN THE CANCELLATION OF YOUR SURGERY PATIENT SIGNATURE_________________________________  NURSE SIGNATURE__________________________________  ________________________________________________________________________

## 2016-11-24 ENCOUNTER — Encounter (INDEPENDENT_AMBULATORY_CARE_PROVIDER_SITE_OTHER): Payer: Self-pay

## 2016-11-24 ENCOUNTER — Encounter (HOSPITAL_COMMUNITY): Payer: Self-pay

## 2016-11-24 ENCOUNTER — Encounter (HOSPITAL_COMMUNITY)
Admission: RE | Admit: 2016-11-24 | Discharge: 2016-11-24 | Disposition: A | Discharge status: Home / Self Care | Payer: Commercial Managed Care - HMO | Source: Ambulatory Visit | Attending: Surgery | Admitting: Surgery

## 2016-11-24 DIAGNOSIS — Z01812 Encounter for preprocedural laboratory examination: Secondary | ICD-10-CM | POA: Insufficient documentation

## 2016-11-24 DIAGNOSIS — R1909 Other intra-abdominal and pelvic swelling, mass and lump: Secondary | ICD-10-CM | POA: Insufficient documentation

## 2016-11-24 HISTORY — DX: Personal history of urinary calculi: Z87.442

## 2016-11-24 LAB — CBC
HEMATOCRIT: 39.9 % (ref 36.0–46.0)
HEMOGLOBIN: 13.2 g/dL (ref 12.0–15.0)
MCH: 28 pg (ref 26.0–34.0)
MCHC: 33.1 g/dL (ref 30.0–36.0)
MCV: 84.7 fL (ref 78.0–100.0)
Platelets: 380 10*3/uL (ref 150–400)
RBC: 4.71 MIL/uL (ref 3.87–5.11)
RDW: 14.1 % (ref 11.5–15.5)
WBC: 10.4 10*3/uL (ref 4.0–10.5)

## 2016-11-24 LAB — BASIC METABOLIC PANEL
ANION GAP: 10 (ref 5–15)
BUN: 22 mg/dL — AB (ref 6–20)
CO2: 27 mmol/L (ref 22–32)
Calcium: 10.2 mg/dL (ref 8.9–10.3)
Chloride: 103 mmol/L (ref 101–111)
Creatinine, Ser: 0.83 mg/dL (ref 0.44–1.00)
GFR calc Af Amer: >60 mL/min (ref >60)
GLUCOSE: 185 mg/dL — AB (ref 65–99)
POTASSIUM: 4.9 mmol/L (ref 3.5–5.1)
Sodium: 140 mmol/L (ref 135–145)

## 2016-11-24 LAB — GLUCOSE, CAPILLARY: Glucose-Capillary: 225 mg/dL — ABNORMAL HIGH (ref 65–99)

## 2016-11-24 NOTE — Progress Notes (Signed)
CBG- 225 today at Pre-op appointment

## 2016-11-24 NOTE — Progress Notes (Signed)
   11/24/16 1000  OBSTRUCTIVE SLEEP APNEA  Have you ever been diagnosed with sleep apnea through a sleep study? No  Do you snore loudly (loud enough to be heard through closed doors)?  0  Do you often feel tired, fatigued, or sleepy during the daytime (such as falling asleep during driving or talking to someone)? 1  Has anyone observed you stop breathing during your sleep? 0  Do you have, or are you being treated for high blood pressure? 1  BMI more than 35 kg/m2? 1  Age > 50 (1-yes) 1  Neck circumference greater than:Female 16 inches or larger, Female 17inches or larger? 1  Female Gender (Yes=1) 0  Obstructive Sleep Apnea Score 5  Score 5 or greater  Results sent to PCP

## 2016-11-25 LAB — HEMOGLOBIN A1C
Hgb A1c MFr Bld: 6.2 % — ABNORMAL HIGH (ref 4.8–5.6)
MEAN PLASMA GLUCOSE: 131 mg/dL

## 2016-11-25 NOTE — Progress Notes (Signed)
03/12/2016- EKG on chart from [facility] 10/14/2016-Nuclear stress test resultsfrom [facility] on chart. 10/07/2016- Last office visit note from Dr. Jyl Heinz (Cardiologist ) on chart. 07/26/2016- Office visit from Dr. [name], Endocrinologist on chart.

## 2016-12-02 MED ORDER — DEXTROSE 5 % IV SOLN
3.0000 g | INTRAVENOUS | Status: AC
  Administered 2016-12-03: 3 g via INTRAVENOUS
  Filled 2016-12-02: qty 3

## 2016-12-02 MED ORDER — METRONIDAZOLE IN NACL 5-0.79 MG/ML-% IV SOLN
500.0000 mg | INTRAVENOUS | Status: AC
  Administered 2016-12-03: 500 mg via INTRAVENOUS
  Filled 2016-12-02: qty 100

## 2016-12-02 NOTE — Anesthesia Preprocedure Evaluation (Addendum)
Anesthesia Evaluation  Patient identified by MRN, date of birth, ID band Patient awake    Reviewed: Allergy & Precautions, H&P , NPO status , Patient's Chart, lab work & pertinent test results  Airway Mallampati: III  TM Distance: <3 FB Neck ROM: Full    Dental no notable dental hx. (+) Teeth Intact, Caps,    Pulmonary asthma ,    breath sounds clear to auscultation + decreased breath sounds      Cardiovascular hypertension, Pt. on medications  Rhythm:Regular Rate:Normal     Neuro/Psych  Headaches, Seizures -,  PSYCHIATRIC DISORDERS Anxiety Depression Bipolar Disorder Spinal cord stimulator  Neuromuscular disease    GI/Hepatic negative GI ROS, Neg liver ROS,   Endo/Other  diabetes, Well Controlled, Type 2, Oral Hypoglycemic AgentsMorbid obesity  Renal/GU Renal InsufficiencyRenal disease  negative genitourinary   Musculoskeletal  (+) Arthritis , Fibromyalgia -  Abdominal (+) + obese,   Peds negative pediatric ROS (+)  Hematology negative hematology ROS (+) anemia ,   Anesthesia Other Findings   Reproductive/Obstetrics negative OB ROS                            Anesthesia Physical  Anesthesia Plan  ASA: III  Anesthesia Plan: General   Post-op Pain Management:    Induction: Intravenous  Airway Management Planned: Oral ETT  Additional Equipment:   Intra-op Plan:   Post-operative Plan: Extubation in OR  Informed Consent: I have reviewed the patients History and Physical, chart, labs and discussed the procedure including the risks, benefits and alternatives for the proposed anesthesia with the patient or authorized representative who has indicated his/her understanding and acceptance.   Dental advisory given  Plan Discussed with: CRNA  Anesthesia Plan Comments:         Anesthesia Quick Evaluation

## 2016-12-03 ENCOUNTER — Encounter (HOSPITAL_COMMUNITY): Payer: Self-pay | Admitting: Certified Registered Nurse Anesthetist

## 2016-12-03 ENCOUNTER — Encounter (HOSPITAL_COMMUNITY)
Admission: RE | Disposition: A | Discharge status: Home / Self Care | Payer: Self-pay | Source: Ambulatory Visit | Attending: Surgery

## 2016-12-03 ENCOUNTER — Inpatient Hospital Stay (HOSPITAL_COMMUNITY): Payer: Commercial Managed Care - HMO | Admitting: Anesthesiology

## 2016-12-03 ENCOUNTER — Inpatient Hospital Stay (HOSPITAL_COMMUNITY)
Admission: RE | Admit: 2016-12-03 | Discharge: 2016-12-06 | DRG: 982 | Disposition: A | Discharge status: Home / Self Care | Payer: Commercial Managed Care - HMO | Source: Ambulatory Visit | Attending: Surgery | Admitting: Surgery

## 2016-12-03 DIAGNOSIS — K432 Incisional hernia without obstruction or gangrene: Secondary | ICD-10-CM | POA: Diagnosis present

## 2016-12-03 DIAGNOSIS — Z8261 Family history of arthritis: Secondary | ICD-10-CM

## 2016-12-03 DIAGNOSIS — R809 Proteinuria, unspecified: Secondary | ICD-10-CM

## 2016-12-03 DIAGNOSIS — F418 Other specified anxiety disorders: Secondary | ICD-10-CM | POA: Diagnosis present

## 2016-12-03 DIAGNOSIS — E1129 Type 2 diabetes mellitus with other diabetic kidney complication: Secondary | ICD-10-CM

## 2016-12-03 DIAGNOSIS — C49A2 Gastrointestinal stromal tumor of stomach: Principal | ICD-10-CM | POA: Diagnosis present

## 2016-12-03 DIAGNOSIS — M797 Fibromyalgia: Secondary | ICD-10-CM | POA: Diagnosis present

## 2016-12-03 DIAGNOSIS — IMO0002 Reserved for concepts with insufficient information to code with codable children: Secondary | ICD-10-CM

## 2016-12-03 DIAGNOSIS — R808 Other proteinuria: Secondary | ICD-10-CM | POA: Diagnosis present

## 2016-12-03 DIAGNOSIS — Z800 Family history of malignant neoplasm of digestive organs: Secondary | ICD-10-CM

## 2016-12-03 DIAGNOSIS — K580 Irritable bowel syndrome with diarrhea: Secondary | ICD-10-CM | POA: Diagnosis present

## 2016-12-03 DIAGNOSIS — Z79899 Other long term (current) drug therapy: Secondary | ICD-10-CM

## 2016-12-03 DIAGNOSIS — Z8249 Family history of ischemic heart disease and other diseases of the circulatory system: Secondary | ICD-10-CM

## 2016-12-03 DIAGNOSIS — Z87442 Personal history of urinary calculi: Secondary | ICD-10-CM

## 2016-12-03 DIAGNOSIS — I10 Essential (primary) hypertension: Secondary | ICD-10-CM | POA: Diagnosis present

## 2016-12-03 DIAGNOSIS — R112 Nausea with vomiting, unspecified: Secondary | ICD-10-CM | POA: Diagnosis present

## 2016-12-03 DIAGNOSIS — M549 Dorsalgia, unspecified: Secondary | ICD-10-CM | POA: Diagnosis present

## 2016-12-03 DIAGNOSIS — K219 Gastro-esophageal reflux disease without esophagitis: Secondary | ICD-10-CM | POA: Diagnosis present

## 2016-12-03 DIAGNOSIS — E1165 Type 2 diabetes mellitus with hyperglycemia: Secondary | ICD-10-CM | POA: Diagnosis present

## 2016-12-03 DIAGNOSIS — Z794 Long term (current) use of insulin: Secondary | ICD-10-CM

## 2016-12-03 DIAGNOSIS — G894 Chronic pain syndrome: Secondary | ICD-10-CM | POA: Diagnosis present

## 2016-12-03 DIAGNOSIS — G8929 Other chronic pain: Secondary | ICD-10-CM | POA: Diagnosis present

## 2016-12-03 DIAGNOSIS — Z823 Family history of stroke: Secondary | ICD-10-CM

## 2016-12-03 DIAGNOSIS — Z833 Family history of diabetes mellitus: Secondary | ICD-10-CM

## 2016-12-03 DIAGNOSIS — Z803 Family history of malignant neoplasm of breast: Secondary | ICD-10-CM

## 2016-12-03 DIAGNOSIS — Z903 Acquired absence of stomach [part of]: Secondary | ICD-10-CM

## 2016-12-03 DIAGNOSIS — Z6841 Body Mass Index (BMI) 40.0 and over, adult: Secondary | ICD-10-CM

## 2016-12-03 DIAGNOSIS — E7800 Pure hypercholesterolemia, unspecified: Secondary | ICD-10-CM | POA: Diagnosis present

## 2016-12-03 HISTORY — DX: Sepsis, unspecified organism: A41.9

## 2016-12-03 HISTORY — DX: Severe sepsis without septic shock: R65.20

## 2016-12-03 HISTORY — DX: Urinary tract infection, site not specified: N39.0

## 2016-12-03 HISTORY — DX: Acute kidney failure, unspecified: N17.9

## 2016-12-03 HISTORY — PX: LAPAROSCOPIC GASTRIC RESECTION: SHX1936

## 2016-12-03 LAB — GLUCOSE, CAPILLARY
GLUCOSE-CAPILLARY: 81 mg/dL (ref 65–99)
Glucose-Capillary: 145 mg/dL — ABNORMAL HIGH (ref 65–99)
Glucose-Capillary: 119 mg/dL — ABNORMAL HIGH (ref 65–99)
Glucose-Capillary: 98 mg/dL (ref 65–99)

## 2016-12-03 SURGERY — LAPAROSCOPIC GASTRIC RESECTION
Anesthesia: General | Site: Abdomen

## 2016-12-03 MED ORDER — KETAMINE HCL 10 MG/ML IJ SOLN
INTRAMUSCULAR | Status: AC
  Filled 2016-12-03: qty 1

## 2016-12-03 MED ORDER — MIDAZOLAM HCL 2 MG/2ML IJ SOLN
INTRAMUSCULAR | Status: AC
Start: 2016-12-03 — End: 2016-12-03
  Filled 2016-12-03: qty 2

## 2016-12-03 MED ORDER — KETAMINE HCL 10 MG/ML IJ SOLN
INTRAMUSCULAR | Status: DC | PRN
  Administered 2016-12-03 (×2): 10 mg via INTRAVENOUS
  Administered 2016-12-03: 60 mg via INTRAVENOUS

## 2016-12-03 MED ORDER — HYDRALAZINE HCL 20 MG/ML IJ SOLN: 10.0000 mg | INTRAMUSCULAR | Status: DC | PRN

## 2016-12-03 MED ORDER — PROMETHAZINE HCL 25 MG/ML IJ SOLN: 6.2500 mg | INTRAMUSCULAR | Status: DC | PRN

## 2016-12-03 MED ORDER — LIRAGLUTIDE 18 MG/3ML ~~LOC~~ SOPN: 1.8000 mg | PEN_INJECTOR | Freq: Every day | SUBCUTANEOUS | Status: DC

## 2016-12-03 MED ORDER — BUPROPION HCL 75 MG PO TABS
75.0000 mg | ORAL_TABLET | Freq: Two times a day (BID) | ORAL | Status: DC
  Administered 2016-12-04 – 2016-12-06 (×5): 75 mg via ORAL
  Filled 2016-12-03 (×6): qty 1

## 2016-12-03 MED ORDER — ROCURONIUM BROMIDE 50 MG/5ML IV SOSY
PREFILLED_SYRINGE | INTRAVENOUS | Status: AC
  Filled 2016-12-03: qty 5

## 2016-12-03 MED ORDER — INSULIN GLARGINE 100 UNIT/ML ~~LOC~~ SOLN
60.0000 [IU] | Freq: Every day | SUBCUTANEOUS | Status: DC
  Administered 2016-12-04 – 2016-12-05 (×2): 60 [IU] via SUBCUTANEOUS
  Filled 2016-12-03 (×3): qty 0.6

## 2016-12-03 MED ORDER — STERILE WATER FOR IRRIGATION IR SOLN
Status: DC | PRN
  Administered 2016-12-03: 1000 mL

## 2016-12-03 MED ORDER — ONDANSETRON HCL 4 MG/2ML IJ SOLN
INTRAMUSCULAR | Status: DC | PRN
  Administered 2016-12-03: 4 mg via INTRAVENOUS

## 2016-12-03 MED ORDER — LACTATED RINGERS IR SOLN
Status: DC | PRN
  Administered 2016-12-03: 3000 mL

## 2016-12-03 MED ORDER — LIDOCAINE 2% (20 MG/ML) 5 ML SYRINGE
INTRAMUSCULAR | Status: AC
  Filled 2016-12-03: qty 5

## 2016-12-03 MED ORDER — TOPIRAMATE 25 MG PO TABS
50.0000 mg | ORAL_TABLET | Freq: Two times a day (BID) | ORAL | Status: DC
Start: 2016-12-03 — End: 2016-12-06
  Administered 2016-12-03 – 2016-12-06 (×6): 50 mg via ORAL
  Filled 2016-12-03 (×6): qty 2

## 2016-12-03 MED ORDER — LIP MEDEX EX OINT
1.0000 "application " | TOPICAL_OINTMENT | Freq: Two times a day (BID) | CUTANEOUS | Status: DC
  Administered 2016-12-03 – 2016-12-06 (×6): 1 via TOPICAL
  Filled 2016-12-03: qty 7

## 2016-12-03 MED ORDER — HYDROMORPHONE HCL 1 MG/ML IJ SOLN
INTRAMUSCULAR | Status: AC
  Administered 2016-12-03: 0.25 mg via INTRAVENOUS
  Filled 2016-12-03: qty 1

## 2016-12-03 MED ORDER — PIOGLITAZONE HCL 45 MG PO TABS
45.0000 mg | ORAL_TABLET | Freq: Every day | ORAL | Status: DC
  Administered 2016-12-05 – 2016-12-06 (×2): 45 mg via ORAL
  Filled 2016-12-03 (×3): qty 1

## 2016-12-03 MED ORDER — OXYCODONE HCL 5 MG PO TABS: 5.0000 mg | ORAL_TABLET | ORAL | Status: DC | PRN

## 2016-12-03 MED ORDER — LACTATED RINGERS IV SOLN
INTRAVENOUS | Status: DC | PRN
  Administered 2016-12-03 (×2): via INTRAVENOUS

## 2016-12-03 MED ORDER — MENTHOL 3 MG MT LOZG: 1.0000 | LOZENGE | OROMUCOSAL | Status: DC | PRN

## 2016-12-03 MED ORDER — BUPIVACAINE HCL (PF) 0.25 % IJ SOLN
INTRAMUSCULAR | Status: AC
  Filled 2016-12-03: qty 30

## 2016-12-03 MED ORDER — BUPIVACAINE LIPOSOME 1.3 % IJ SUSP
20.0000 mL | INTRAMUSCULAR | Status: DC
  Filled 2016-12-03: qty 20

## 2016-12-03 MED ORDER — DIPHENHYDRAMINE HCL 12.5 MG/5ML PO ELIX: 12.5000 mg | ORAL_SOLUTION | Freq: Four times a day (QID) | ORAL | Status: DC | PRN

## 2016-12-03 MED ORDER — FENTANYL CITRATE (PF) 250 MCG/5ML IJ SOLN
INTRAMUSCULAR | Status: DC | PRN
  Administered 2016-12-03 (×2): 50 ug via INTRAVENOUS
  Administered 2016-12-03: 100 ug via INTRAVENOUS
  Administered 2016-12-03: 50 ug via INTRAVENOUS

## 2016-12-03 MED ORDER — BUPIVACAINE LIPOSOME 1.3 % IJ SUSP
INTRAMUSCULAR | Status: DC | PRN
  Administered 2016-12-03: 20 mL

## 2016-12-03 MED ORDER — VITAMIN D 1000 UNITS PO TABS
5000.0000 [IU] | ORAL_TABLET | Freq: Every day | ORAL | Status: DC
  Administered 2016-12-06: 5000 [IU] via ORAL
  Filled 2016-12-03: qty 5

## 2016-12-03 MED ORDER — SIMETHICONE 80 MG PO CHEW: 40.0000 mg | CHEWABLE_TABLET | Freq: Four times a day (QID) | ORAL | Status: DC | PRN

## 2016-12-03 MED ORDER — LAMOTRIGINE 100 MG PO TABS
100.0000 mg | ORAL_TABLET | Freq: Every day | ORAL | Status: DC
  Administered 2016-12-03 – 2016-12-05 (×2): 100 mg via ORAL
  Filled 2016-12-03 (×2): qty 1

## 2016-12-03 MED ORDER — 0.9 % SODIUM CHLORIDE (POUR BTL) OPTIME
TOPICAL | Status: DC | PRN
  Administered 2016-12-03: 1000 mL

## 2016-12-03 MED ORDER — ACETAMINOPHEN 500 MG PO TABS
1000.0000 mg | ORAL_TABLET | ORAL | Status: AC
  Administered 2016-12-03: 1000 mg via ORAL
  Filled 2016-12-03: qty 2

## 2016-12-03 MED ORDER — INSULIN ASPART 100 UNIT/ML ~~LOC~~ SOLN: 0.0000 [IU] | Freq: Every day | SUBCUTANEOUS | Status: DC

## 2016-12-03 MED ORDER — TIZANIDINE HCL 4 MG PO TABS
4.0000 mg | ORAL_TABLET | Freq: Three times a day (TID) | ORAL | Status: DC | PRN
Start: 2016-12-03 — End: 2016-12-06

## 2016-12-03 MED ORDER — HYDROCHLOROTHIAZIDE 25 MG PO TABS
25.0000 mg | ORAL_TABLET | Freq: Every day | ORAL | Status: DC
  Administered 2016-12-04 – 2016-12-06 (×3): 25 mg via ORAL
  Filled 2016-12-03 (×3): qty 1

## 2016-12-03 MED ORDER — PHENYLEPHRINE HCL 10 MG/ML IJ SOLN
INTRAMUSCULAR | Status: DC | PRN
  Administered 2016-12-03 (×2): 80 ug via INTRAVENOUS

## 2016-12-03 MED ORDER — HYDROMORPHONE HCL 1 MG/ML IJ SOLN
INTRAMUSCULAR | Status: AC
  Administered 2016-12-03: 0.5 mg via INTRAVENOUS
  Filled 2016-12-03: qty 1

## 2016-12-03 MED ORDER — ONDANSETRON HCL 4 MG/2ML IJ SOLN
INTRAMUSCULAR | Status: AC
  Filled 2016-12-03: qty 2

## 2016-12-03 MED ORDER — LACTATED RINGERS IV SOLN
INTRAVENOUS | Status: AC
  Administered 2016-12-03: 16:00:00 via INTRAVENOUS

## 2016-12-03 MED ORDER — PHENOL 1.4 % MT LIQD: 2.0000 | OROMUCOSAL | Status: DC | PRN

## 2016-12-03 MED ORDER — ICOSAPENT ETHYL 1 G PO CAPS: 1.0000 g | ORAL_CAPSULE | Freq: Two times a day (BID) | ORAL | Status: DC

## 2016-12-03 MED ORDER — ALUM & MAG HYDROXIDE-SIMETH 200-200-20 MG/5ML PO SUSP: 30.0000 mL | Freq: Four times a day (QID) | ORAL | Status: DC | PRN

## 2016-12-03 MED ORDER — PROCHLORPERAZINE EDISYLATE 5 MG/ML IJ SOLN: 5.0000 mg | INTRAMUSCULAR | Status: DC | PRN

## 2016-12-03 MED ORDER — METOPROLOL TARTRATE 5 MG/5ML IV SOLN: 5.0000 mg | Freq: Four times a day (QID) | INTRAVENOUS | Status: DC | PRN

## 2016-12-03 MED ORDER — LACTATED RINGERS IV BOLUS (SEPSIS)
1000.0000 mL | Freq: Three times a day (TID) | INTRAVENOUS | Status: AC | PRN
  Administered 2016-12-04: 1000 mL via INTRAVENOUS
  Filled 2016-12-03: qty 1000

## 2016-12-03 MED ORDER — SUGAMMADEX SODIUM 500 MG/5ML IV SOLN
INTRAVENOUS | Status: AC
  Filled 2016-12-03: qty 5

## 2016-12-03 MED ORDER — INSULIN ASPART 100 UNIT/ML ~~LOC~~ SOLN
0.0000 [IU] | Freq: Three times a day (TID) | SUBCUTANEOUS | Status: DC
  Administered 2016-12-04 – 2016-12-05 (×3): 3 [IU] via SUBCUTANEOUS
  Administered 2016-12-05: 7 [IU] via SUBCUTANEOUS

## 2016-12-03 MED ORDER — METRONIDAZOLE IN NACL 5-0.79 MG/ML-% IV SOLN
INTRAVENOUS | Status: AC
  Filled 2016-12-03: qty 100

## 2016-12-03 MED ORDER — GEMFIBROZIL 600 MG PO TABS
600.0000 mg | ORAL_TABLET | Freq: Two times a day (BID) | ORAL | Status: DC
  Administered 2016-12-05 – 2016-12-06 (×3): 600 mg via ORAL
  Filled 2016-12-03 (×6): qty 1

## 2016-12-03 MED ORDER — HYDROMORPHONE HCL 1 MG/ML IJ SOLN
0.5000 mg | INTRAMUSCULAR | Status: DC | PRN
  Administered 2016-12-03 – 2016-12-04 (×7): 1 mg via INTRAVENOUS
  Filled 2016-12-03 (×7): qty 1

## 2016-12-03 MED ORDER — LISINOPRIL 20 MG PO TABS
20.0000 mg | ORAL_TABLET | Freq: Every day | ORAL | Status: DC
  Administered 2016-12-04 – 2016-12-06 (×3): 20 mg via ORAL
  Filled 2016-12-03 (×3): qty 1

## 2016-12-03 MED ORDER — CHLORHEXIDINE GLUCONATE CLOTH 2 % EX PADS: 6.0000 | MEDICATED_PAD | Freq: Once | CUTANEOUS | Status: DC

## 2016-12-03 MED ORDER — LORAZEPAM 2 MG/ML IJ SOLN: 0.5000 mg | Freq: Three times a day (TID) | INTRAMUSCULAR | Status: DC | PRN

## 2016-12-03 MED ORDER — FENTANYL CITRATE (PF) 250 MCG/5ML IJ SOLN
INTRAMUSCULAR | Status: AC
  Filled 2016-12-03: qty 5

## 2016-12-03 MED ORDER — SUCCINYLCHOLINE CHLORIDE 20 MG/ML IJ SOLN
INTRAMUSCULAR | Status: DC | PRN
  Administered 2016-12-03: 140 mg via INTRAVENOUS

## 2016-12-03 MED ORDER — DIPHENHYDRAMINE HCL 50 MG/ML IJ SOLN: 12.5000 mg | Freq: Four times a day (QID) | INTRAMUSCULAR | Status: DC | PRN

## 2016-12-03 MED ORDER — GABAPENTIN 300 MG PO CAPS
300.0000 mg | ORAL_CAPSULE | ORAL | Status: AC
  Administered 2016-12-03: 300 mg via ORAL
  Filled 2016-12-03: qty 1

## 2016-12-03 MED ORDER — MAGIC MOUTHWASH
15.0000 mL | Freq: Four times a day (QID) | ORAL | Status: DC | PRN
  Filled 2016-12-03: qty 15

## 2016-12-03 MED ORDER — METFORMIN HCL 500 MG PO TABS
1000.0000 mg | ORAL_TABLET | Freq: Two times a day (BID) | ORAL | Status: DC
  Administered 2016-12-05 – 2016-12-06 (×3): 1000 mg via ORAL
  Filled 2016-12-03 (×4): qty 2

## 2016-12-03 MED ORDER — SUCCINYLCHOLINE CHLORIDE 200 MG/10ML IV SOSY
PREFILLED_SYRINGE | INTRAVENOUS | Status: AC
  Filled 2016-12-03: qty 10

## 2016-12-03 MED ORDER — LIDOCAINE HCL (CARDIAC) 20 MG/ML IV SOLN
INTRAVENOUS | Status: DC | PRN
  Administered 2016-12-03: 100 mg via INTRATRACHEAL

## 2016-12-03 MED ORDER — ROCURONIUM BROMIDE 100 MG/10ML IV SOLN
INTRAVENOUS | Status: DC | PRN
  Administered 2016-12-03: 10 mg via INTRAVENOUS
  Administered 2016-12-03: 47 mg via INTRAVENOUS
  Administered 2016-12-03: 20 mg via INTRAVENOUS
  Administered 2016-12-03: 3 mg via INTRAVENOUS

## 2016-12-03 MED ORDER — ENOXAPARIN SODIUM 40 MG/0.4ML ~~LOC~~ SOLN
40.0000 mg | SUBCUTANEOUS | Status: DC
  Administered 2016-12-04 – 2016-12-06 (×3): 40 mg via SUBCUTANEOUS
  Filled 2016-12-03 (×3): qty 0.4

## 2016-12-03 MED ORDER — SUGAMMADEX SODIUM 500 MG/5ML IV SOLN
INTRAVENOUS | Status: DC | PRN
  Administered 2016-12-03: 400 mg via INTRAVENOUS

## 2016-12-03 MED ORDER — MEPERIDINE HCL 50 MG/ML IJ SOLN: 6.2500 mg | INTRAMUSCULAR | Status: DC | PRN

## 2016-12-03 MED ORDER — OMEGA-3-ACID ETHYL ESTERS 1 G PO CAPS
1.0000 g | ORAL_CAPSULE | Freq: Every day | ORAL | Status: DC
  Administered 2016-12-05 – 2016-12-06 (×2): 1 g via ORAL
  Filled 2016-12-03 (×3): qty 1

## 2016-12-03 MED ORDER — PROPOFOL 10 MG/ML IV BOLUS
INTRAVENOUS | Status: AC
  Filled 2016-12-03: qty 20

## 2016-12-03 MED ORDER — MIDAZOLAM HCL 5 MG/5ML IJ SOLN
INTRAMUSCULAR | Status: DC | PRN
  Administered 2016-12-03: 2 mg via INTRAVENOUS

## 2016-12-03 MED ORDER — BUPIVACAINE HCL (PF) 0.25 % IJ SOLN
INTRAMUSCULAR | Status: DC | PRN
  Administered 2016-12-03: 30 mL

## 2016-12-03 MED ORDER — ROSUVASTATIN CALCIUM 20 MG PO TABS
20.0000 mg | ORAL_TABLET | Freq: Every day | ORAL | Status: DC
  Administered 2016-12-04 – 2016-12-05 (×2): 20 mg via ORAL
  Filled 2016-12-03 (×3): qty 1

## 2016-12-03 MED ORDER — BISACODYL 10 MG RE SUPP: 10.0000 mg | Freq: Two times a day (BID) | RECTAL | Status: DC | PRN

## 2016-12-03 MED ORDER — CEFOTETAN DISODIUM 2 G IJ SOLR
2.0000 g | Freq: Two times a day (BID) | INTRAMUSCULAR | Status: AC
  Administered 2016-12-03: 2 g via INTRAVENOUS
  Filled 2016-12-03: qty 2

## 2016-12-03 MED ORDER — HYDROMORPHONE HCL 1 MG/ML IJ SOLN
0.2500 mg | INTRAMUSCULAR | Status: DC | PRN
  Administered 2016-12-03: 0.5 mg via INTRAVENOUS
  Administered 2016-12-03 (×3): 0.25 mg via INTRAVENOUS
  Administered 2016-12-03: 0.5 mg via INTRAVENOUS
  Administered 2016-12-03: 0.25 mg via INTRAVENOUS

## 2016-12-03 MED ORDER — PROPOFOL 10 MG/ML IV BOLUS
INTRAVENOUS | Status: DC | PRN
  Administered 2016-12-03: 200 mg via INTRAVENOUS

## 2016-12-03 MED ORDER — ACETAMINOPHEN 500 MG PO TABS
1000.0000 mg | ORAL_TABLET | Freq: Three times a day (TID) | ORAL | Status: DC
  Administered 2016-12-04 – 2016-12-06 (×6): 1000 mg via ORAL
  Filled 2016-12-03 (×8): qty 2

## 2016-12-03 MED ORDER — FERROUS SULFATE 325 (65 FE) MG PO TABS
325.0000 mg | ORAL_TABLET | Freq: Every day | ORAL | Status: DC
  Administered 2016-12-04 – 2016-12-06 (×3): 325 mg via ORAL
  Filled 2016-12-03 (×3): qty 1

## 2016-12-03 MED ORDER — CELECOXIB 200 MG PO CAPS
400.0000 mg | ORAL_CAPSULE | ORAL | Status: AC
  Administered 2016-12-03: 400 mg via ORAL
  Filled 2016-12-03: qty 2

## 2016-12-03 MED ORDER — ALBUTEROL SULFATE (2.5 MG/3ML) 0.083% IN NEBU: 2.5000 mg | INHALATION_SOLUTION | Freq: Four times a day (QID) | RESPIRATORY_TRACT | Status: DC | PRN

## 2016-12-03 MED ORDER — CITALOPRAM HYDROBROMIDE 20 MG PO TABS
40.0000 mg | ORAL_TABLET | Freq: Every day | ORAL | Status: DC
  Administered 2016-12-04 – 2016-12-06 (×3): 40 mg via ORAL
  Filled 2016-12-03 (×3): qty 2

## 2016-12-03 MED ORDER — AMITRIPTYLINE HCL 100 MG PO TABS
100.0000 mg | ORAL_TABLET | Freq: Every day | ORAL | Status: DC
  Administered 2016-12-04 – 2016-12-05 (×2): 100 mg via ORAL
  Filled 2016-12-03 (×3): qty 1

## 2016-12-03 MED ORDER — ONDANSETRON HCL 4 MG/2ML IJ SOLN: 4.0000 mg | Freq: Four times a day (QID) | INTRAMUSCULAR | Status: DC | PRN

## 2016-12-03 MED ORDER — ONDANSETRON 4 MG PO TBDP: 4.0000 mg | ORAL_TABLET | Freq: Four times a day (QID) | ORAL | Status: DC | PRN

## 2016-12-03 SURGICAL SUPPLY — 65 items
APPLIER CLIP ROT 10 11.4 M/L (STAPLE)
APPLIER CLIP UNV 5X34 EPIX (ENDOMECHANICALS) ×3 IMPLANT
APR CLP MED LRG 11.4X10 (STAPLE)
APR XCLPCLP 20M/L UNV 34X5 (ENDOMECHANICALS) ×2
BAG SPEC RTRVL LRG 6X4 10 (ENDOMECHANICALS)
CABLE HIGH FREQUENCY MONO STRZ (ELECTRODE) ×3 IMPLANT
CELLS DAT CNTRL 66122 CELL SVR (MISCELLANEOUS) IMPLANT
CHLORAPREP W/TINT 26ML (MISCELLANEOUS) ×5 IMPLANT
CLIP APPLIE ROT 10 11.4 M/L (STAPLE) IMPLANT
COVER MAYO STAND STRL (DRAPES) IMPLANT
COVER SURGICAL LIGHT HANDLE (MISCELLANEOUS) ×3 IMPLANT
DEVICE TROCAR PUNCTURE CLOSURE (ENDOMECHANICALS) IMPLANT
DRAPE LAPAROSCOPIC ABDOMINAL (DRAPES) ×3 IMPLANT
DRAPE UTILITY XL STRL (DRAPES) ×3 IMPLANT
DRAPE WARM FLUID 44X44 (DRAPE) ×3 IMPLANT
DRSG TEGADERM 2-3/8X2-3/4 SM (GAUZE/BANDAGES/DRESSINGS) ×5 IMPLANT
DRSG TEGADERM 4X4.75 (GAUZE/BANDAGES/DRESSINGS) ×2 IMPLANT
ELECT PENCIL ROCKER SW 15FT (MISCELLANEOUS) IMPLANT
ELECT REM PT RETURN 9FT ADLT (ELECTROSURGICAL)
ELECTRODE REM PT RTRN 9FT ADLT (ELECTROSURGICAL) IMPLANT
ENDOLOOP SUT PDS II  0 18 (SUTURE)
ENDOLOOP SUT PDS II 0 18 (SUTURE) IMPLANT
GAUZE SPONGE 2X2 8PLY STRL LF (GAUZE/BANDAGES/DRESSINGS) ×2 IMPLANT
GAUZE SPONGE 4X4 12PLY STRL (GAUZE/BANDAGES/DRESSINGS) IMPLANT
GLOVE ECLIPSE 8.0 STRL XLNG CF (GLOVE) ×3 IMPLANT
GLOVE INDICATOR 8.0 STRL GRN (GLOVE) ×3 IMPLANT
IRRIG SUCT STRYKERFLOW 2 WTIP (MISCELLANEOUS) ×3
IRRIGATION SUCT STRKRFLW 2 WTP (MISCELLANEOUS) ×2 IMPLANT
KIT BASIN OR (CUSTOM PROCEDURE TRAY) ×3 IMPLANT
PAD POSITIONING PINK XL (MISCELLANEOUS) ×3 IMPLANT
POUCH SPECIMEN RETRIEVAL 10MM (ENDOMECHANICALS) IMPLANT
RELOAD EGIA 60 MED/THCK PURPLE (STAPLE) IMPLANT
RELOAD STAPLE 60 3.6 BLU REG (STAPLE) IMPLANT
RELOAD STAPLE 60 3.8 GOLD REG (STAPLE) IMPLANT
RELOAD STAPLE 60 4.1 GRN THCK (STAPLE) IMPLANT
RELOAD STAPLE 60 MED/THCK ART (STAPLE) IMPLANT
RELOAD STAPLER BLUE 60MM (STAPLE) IMPLANT
RELOAD STAPLER GOLD 60MM (STAPLE) IMPLANT
RELOAD STAPLER GREEN 60MM (STAPLE) IMPLANT
RETRACTOR WND ALEXIS 18 MED (MISCELLANEOUS) IMPLANT
RTRCTR WOUND ALEXIS 18CM MED (MISCELLANEOUS)
SCISSORS LAP 5X35 DISP (ENDOMECHANICALS) ×3 IMPLANT
SCRUB SOL TECHNICARE 32FOOTPED (MISCELLANEOUS) ×2 IMPLANT
SET IRRIG TUBING LAPAROSCOPIC (IRRIGATION / IRRIGATOR) ×2 IMPLANT
SHEARS HARMONIC ACE PLUS 36CM (ENDOMECHANICALS) IMPLANT
SLEEVE XCEL OPT CAN 5 100 (ENDOMECHANICALS) ×12 IMPLANT
SPONGE GAUZE 2X2 STER 10/PKG (GAUZE/BANDAGES/DRESSINGS) ×1
SPONGE LAP 18X18 X RAY DECT (DISPOSABLE) IMPLANT
STAPLE ECHEON FLEX 60 POW ENDO (STAPLE) IMPLANT
STAPLER RELOAD BLUE 60MM (STAPLE)
STAPLER RELOAD GOLD 60MM (STAPLE)
STAPLER RELOAD GREEN 60MM (STAPLE)
STAPLER VISISTAT 35W (STAPLE) IMPLANT
SUT MNCRL AB 4-0 PS2 18 (SUTURE) ×3 IMPLANT
SUT PDS AB 1 CT1 27 (SUTURE) ×4 IMPLANT
SUT V-LOC BARB 180 2/0GR6 GS22 (SUTURE) ×12
SUTURE V-LC BRB 180 2/0GR6GS22 (SUTURE) ×4 IMPLANT
TAPE UMBILICAL COTTON 1/8X30 (MISCELLANEOUS) ×2 IMPLANT
TOWEL OR 17X26 10 PK STRL BLUE (TOWEL DISPOSABLE) ×3 IMPLANT
TRAY FOLEY CATH 14FRSI W/METER (CATHETERS) ×2 IMPLANT
TRAY LAPAROSCOPIC (CUSTOM PROCEDURE TRAY) ×3 IMPLANT
TROCAR BLADELESS OPT 5 100 (ENDOMECHANICALS) ×3 IMPLANT
TROCAR XCEL 12X100 BLDLESS (ENDOMECHANICALS) ×2 IMPLANT
TROCAR XCEL NON-BLD 11X100MML (ENDOMECHANICALS) IMPLANT
TUBING INSUF HEATED (TUBING) ×3 IMPLANT

## 2016-12-03 NOTE — Progress Notes (Addendum)
Patient arrived on unit at 1310. Pt  NG putting out brown bile, patient complaining of a lot of throat pain. I explained to patient and family member it was because of intubation and also the irritation of the NGT.  I gave the patient a few ice chips to help soothe her throat, per orders.  Patient called out at 1410 and I was called into room as patient was feeling like she was choking. I went into the room and the NGT was coiled tho times and was coming out of her mouth.  I  Finished  removing the NGT  and called the MD.  Dr Johney Maine returned call at 1415 and said for nurse to reinsert, but to write notes appropriately.

## 2016-12-03 NOTE — Discharge Instructions (Signed)
EATING AFTER YOUR ESOPHAGEAL SURGERY (Stomach Fundoplication, Hiatal Hernia repair, Achalasia surgery, etc)  ######################################################################  EAT Start with a pureed / full liquid diet (see below) Gradually transition to a high fiber diet with a fiber supplement over the next month after discharge.    WALK Walk an hour a day.  Control your pain to do that.    CONTROL PAIN Control pain so that you can walk, sleep, tolerate sneezing/coughing, go up/down stairs.  HAVE A BOWEL MOVEMENT DAILY Keep your bowels regular to avoid problems.  OK to try a laxative to override constipation.  OK to use an antidairrheal to slow down diarrhea.  Call if not better after 2 tries  CALL IF YOU HAVE PROBLEMS/CONCERNS Call if you are still struggling despite following these instructions. Call if you have concerns not answered by these instructions  ######################################################################   After your esophageal surgery, expect some sticking with swallowing over the next 1-2 months.    If food sticks when you eat, it is called "dysphagia".  This is due to swelling around your esophagus at the wrap & hiatal diaphragm repair.  It will gradually ease off over the next few months.  To help you through this temporary phase, we start you out on a pureed (blenderized) diet.  Your first meal in the hospital was thin liquids.  You should have been given a pureed diet by the time you left the hospital.  We ask patients to stay on a pureed diet for the first 2-3 weeks to avoid anything getting "stuck" near your recent surgery.  Don't be alarmed if your ability to swallow doesn't progress according to this plan.  Everyone is different and some diets can advance more or less quickly.     Some BASIC RULES to follow are:  Maintain an upright position whenever eating or drinking.  Take small bites - just a teaspoon size bite at a time.  Eat slowly.   It may also help to eat only one food at a time.  Consider nibbling through smaller, more frequent meals & avoid the urge to eat BIG meals  Do not push through feelings of fullness, nausea, or bloatedness  Do not mix solid foods and liquids in the same mouthful  Try not to "wash foods down" with large gulps of liquids.  Avoid carbonated (bubbly/fizzy) drinks.    Avoid foods that make you feel gassy or bloated.  Start with bland foods first.  Wait on trying greasy, fried, or spicy meals until you are tolerating more bland solids well.  Understand that it will be hard to burp and belch at first.  This gradually improves with time.  Expect to be more gassy/flatulent/bloated initially.  Walking will help your body manage it better.  Consider using medications for bloating that contain simethicone such as  Maalox or Gas-X   Eat in a relaxed atmosphere & minimize distractions.  Avoid talking while eating.    Do not use straws.  Following each meal, sit in an upright position (90 degree angle) for 60 to 90 minutes.  Going for a short walk can help as well  If food does stick, don't panic.  Try to relax and let the food pass on its own.  Sipping WARM LIQUID such as strong hot black tea can also help slide it down.   Be gradual in changes & use common sense:  -If you easily tolerating a certain "level" of foods, advance to the next level gradually -If you are  having trouble swallowing a particular food, then avoid it.   -If food is sticking when you advance your diet, go back to thinner previous diet (the lower LEVEL) for 1-2 days.  LEVEL 1 = PUREED DIET  Do for the first 2 WEEKS AFTER SURGERY  -Foods in this group are pureed or blenderized to a smooth, mashed potato-like consistency.  -If necessary, the pureed foods can keep their shape with the addition of a thickening agent.   -Meat should be pureed to a smooth, pasty consistency.  Hot broth or gravy may be added to the pureed  meat, approximately 1 oz. of liquid per 3 oz. serving of meat. -CAUTION:  If any foods do not puree into a smooth consistency, swallowing will be more difficult.  (For example, nuts or seeds sometimes do not blend well.)  Hot Foods Cold Foods  Pureed scrambled eggs and cheese Pureed cottage cheese  Baby cereals Thickened juices and nectars  Thinned cooked cereals (no lumps) Thickened milk or eggnog  Pureed [place] toast or pancakes Ensure  Mashed potatoes Ice cream  Pureed parsley, au gratin, scalloped potatoes, candied sweet potatoes Fruit or [place] ice, sherbet  Pureed buttered or alfredo noodles Plain yogurt  Pureed vegetables (no corn or peas) Instant breakfast  Pureed soups and creamed soups Smooth pudding, mousse, custard  Pureed scalloped apples Whipped gelatin  Gravies Sugar, syrup, honey, jelly  Sauces, cheese, tomato, barbecue, white, creamed Cream  Any baby food Creamer  Alcohol in moderation (not beer or champagne) Margarine  Coffee or tea Mayonnaise   Ketchup, mustard   Apple sauce   SAMPLE MENU:  PUREED DIET Breakfast Lunch Dinner   Orange juice, 1/2 cup  Cream of wheat, 1/2 cup  Pineapple juice, 1/2 cup  Pureed [place], barley soup, 3/4 cup  Pureed Hawaiian chicken, 3 oz   Scrambled eggs, mashed or blended with cheese, 1/2 cup  Tea or coffee, 1 cup   Whole milk, 1 cup   Non-dairy creamer, 2 Tbsp.  Mashed potatoes, 1/2 cup  Pureed cooled broccoli, 1/2 cup  Apple sauce, 1/2 cup  Coffee or tea  Mashed potatoes, 1/2 cup  Pureed spinach, 1/2 cup  Frozen yogurt, 1/2 cup  Tea or coffee      LEVEL 2 = SOFT DIET  After your first 2 weeks, you can advance to a soft diet.   Keep on this diet until everything goes down easily.  Hot Foods Cold Foods  White fish Cottage cheese  Stuffed fish Junior baby fruit  Baby food meals Semi thickened juices  Minced soft cooked, scrambled, poached eggs nectars  Souffle & omelets Ripe mashed bananas  Cooked  cereals Canned fruit, pineapple sauce, milk  potatoes Milkshake  Buttered or Alfredo noodles Custard  Cooked cooled vegetable Puddings, including tapioca  Sherbet Yogurt  Vegetable soup or alphabet soup Fruit ice, [place] ice  Gravies Whipped gelatin  Sugar, syrup, honey, jelly Junior baby desserts  Sauces:  Cheese, creamed, barbecue, tomato, white Cream  Coffee or tea Margarine   SAMPLE MENU:  LEVEL 2 Breakfast Lunch Dinner   Orange juice, 1/2 cup  Oatmeal, 1/2 cup  Scrambled eggs with cheese, 1/2 cup  Decaffeinated tea, 1 cup  Whole milk, 1 cup  Non-dairy creamer, 2 Tbsp  Pineapple juice, 1/2 cup  Minced beef, 3 oz  Gravy, 2 Tbsp  Mashed potatoes, 1/2 cup  Minced fresh broccoli, 1/2 cup  Applesauce, 1/2 cup  Coffee, 1 cup  [place], barley soup, 3/4 cup  Minced Hawaiian chicken, 3 oz  Mashed potatoes, 1/2 cup  Cooked spinach, 1/2 cup  Frozen yogurt, 1/2 cup  Non-dairy creamer, 2 Tbsp      LEVEL 3 = CHOPPED DIET  -After all the foods in level 2 (soft diet) are passing through well you should advance up to more chopped foods.  -It is still important to cut these foods into small pieces and eat slowly.  Hot Foods Cold Foods  Poultry Cottage cheese  Chopped Swedish meatballs Yogurt  Meat salads (ground or flaked meat) Milk  Flaked fish (tuna) Milkshakes  Poached or scrambled eggs Soft, cold, dry cereal  Souffles and omelets Fruit juices or nectars  Cooked cereals Chopped canned fruit  Chopped [place] toast or pancakes Canned fruit cocktail  Noodles or pasta (no rice) Pudding, mousse, custard  Cooked vegetables (no frozen peas, corn, or mixed vegetables) Green salad  Canned small sweet peas Ice cream  Creamed soup or vegetable soup Fruit ice, [place] ice  Pureed vegetable soup or alphabet soup Non-dairy creamer  Ground scalloped apples Margarine  Gravies Mayonnaise  Sauces:  Cheese, creamed, barbecue, tomato, white Ketchup  Coffee or tea Mustard    SAMPLE MENU:  LEVEL 3 Breakfast Lunch Dinner   Orange juice, 1/2 cup  Oatmeal, 1/2 cup  Scrambled eggs with cheese, 1/2 cup  Decaffeinated tea, 1 cup  Whole milk, 1 cup  Non-dairy creamer, 2 Tbsp  Ketchup, 1 Tbsp  Margarine, 1 tsp  Salt, 1/4 tsp  Sugar, 2 tsp  Pineapple juice, 1/2 cup  Ground beef, 3 oz  Gravy, 2 Tbsp  Mashed potatoes, 1/2 cup  Cooked spinach, 1/2 cup  Applesauce, 1/2 cup  Decaffeinated coffee  Whole milk  Non-dairy creamer, 2 Tbsp  Margarine, 1 tsp  Salt, 1/4 tsp  Pureed [place], barley soup, 3/4 cup  Barbecue chicken, 3 oz  Mashed potatoes, 1/2 cup  Ground fresh broccoli, 1/2 cup  Frozen yogurt, 1/2 cup  Decaffeinated tea, 1 cup  Non-dairy creamer, 2 Tbsp  Margarine, 1 tsp  Salt, 1/4 tsp  Sugar, 1 tsp    LEVEL 4:  REGULAR FOODS  -Foods in this group are soft, moist, regularly textured foods.   -This level includes meat and breads, which tend to be the hardest things to swallow.   -Eat very slowly, chew well and continue to avoid carbonated drinks. -most people are at this level in 4-6 weeks  Hot Foods Cold Foods  Baked fish or skinned Soft cheeses - cottage cheese  Souffles and omelets Cream cheese  Eggs Yogurt  Stuffed shells Milk  Spaghetti with meat sauce Milkshakes  Cooked cereal Cold dry cereals (no nuts, dried fruit, coconut)  [place] toast or pancakes Crackers  Buttered toast Fruit juices or nectars  Noodles or pasta (no rice) Canned fruit  Potatoes (all types) Ripe bananas  Soft, cooked vegetables (no corn, lima, or baked beans) Peeled, ripe, fresh fruit  Creamed soups or vegetable soup Cakes (no nuts, dried fruit, coconut)  Canned chicken noodle soup Plain doughnuts  Gravies Ice cream  Bacon dressing Pudding, mousse, custard  Sauces:  Cheese, creamed, barbecue, tomato, white Fruit ice, [place] ice, sherbet  Decaffeinated tea or coffee Whipped gelatin  Pork chops Regular gelatin   Canned fruited  gelatin molds   Sugar, syrup, honey, jam, jelly   Cream   Non-dairy   Margarine   Oil   Mayonnaise   Ketchup   Mustard   TROUBLESHOOTING IRREGULAR BOWELS  1) Avoid extremes of bowel  movements (no bad constipation/diarrhea)  2) Miralax 17gm mixed in [place]. water or juice-daily. May use BID as needed.  3) Gas-x,Phazyme, etc. as needed for gas & bloating.  4) Soft,bland diet. No spicy,greasy,fried foods.  5) Prilosec over-the-counter as needed  6) May hold gluten/wheat products from diet to see if symptoms improve.  7) May try probiotics (Align, Activa, etc) to help calm the bowels down  7) If symptoms become worse call back immediately.    If you have any questions please call our office at [place]: [phone].  HERNIA REPAIR: POST OP INSTRUCTIONS  ######################################################################  EAT Gradually transition to a high fiber diet with a fiber supplement over the next few weeks after discharge.  Start with a pureed / full liquid diet (see below)  WALK Walk an hour a day.  Control your pain to do that.    CONTROL PAIN Control pain so that you can walk, sleep, tolerate sneezing/coughing, go up/down stairs.  HAVE A BOWEL MOVEMENT DAILY Keep your bowels regular to avoid problems.  OK to try a laxative to override constipation.  OK to use an antidairrheal to slow down diarrhea.  Call if not better after 2 tries  CALL IF YOU HAVE PROBLEMS/CONCERNS Call if you are still struggling despite following these instructions. Call if you have concerns not answered by these instructions  ######################################################################    1. DIET: Follow a light bland diet the first 24 hours after arrival home, such as soup, liquids, crackers, etc.  Be sure to include lots of fluids daily.  Avoid fast food or heavy meals as your are more likely to get nauseated.  Eat a low fat the next few days after  surgery. 2. Take your usually prescribed home medications unless otherwise directed. 3. PAIN CONTROL: a. Pain is best controlled by a usual combination of three different methods TOGETHER: i. Ice/Heat ii. Over the counter pain medication iii. Prescription pain medication b. Most patients will experience some swelling and bruising around the hernia(s) such as the bellybutton, groins, or old incisions.  Ice packs or heating pads (30-60 minutes up to 6 times a day) will help. Use ice for the first few days to help decrease swelling and bruising, then switch to heat to help relax tight/sore spots and speed recovery.  Some people prefer to use ice alone, heat alone, alternating between ice & heat.  Experiment to what works for you.  Swelling and bruising can take several weeks to resolve.   c. It is helpful to take an over-the-counter pain medication regularly for the first few weeks.  Choose one of the following that works best for you: i. Naproxen (Aleve, etc)  Two 220mg  tabs twice a day ii. Ibuprofen (Advil, etc) Three 200mg  tabs four times a day (every meal & bedtime) iii. Acetaminophen (Tylenol, etc) 325-650mg  four times a day (every meal & bedtime) d. A  prescription for pain medication should be given to you upon discharge.  Take your pain medication as prescribed.  i. If you are having problems/concerns with the prescription medicine (does not control pain, nausea, vomiting, rash, itching, etc), please call us [phone] to see if we need to switch you to a different pain medicine that will work better for you and/or control your side effect better. ii. If you need a refill on your pain medication, please contact your pharmacy.  They will contact our office to request authorization. Prescriptions will not be filled after 5 pm or on week-ends. 4. Avoid  getting constipated.  Between the surgery and the pain medications, it is common to experience some constipation.  Increasing fluid intake and  taking a fiber supplement (such as Metamucil, Citrucel, FiberCon, MiraLax, etc) 1-2 times a day regularly will usually help prevent this problem from occurring.  A mild laxative (prune juice, Milk of Magnesia, MiraLax, etc) should be taken according to package directions if there are no bowel movements after 48 hours.   5. Wash / shower every day.  You may shower over the dressings as they are waterproof.   6. Remove your waterproof bandages 5 days after surgery.  You may leave the incision open to air.  You may replace a dressing/Band-Aid to cover the incision for comfort if you wish.  Continue to shower over incision(s) after the dressing is off.    7. ACTIVITIES as tolerated:   a. You may resume regular (light) daily activities beginning the next day--such as daily self-care, walking, climbing stairs--gradually increasing activities as tolerated.  If you can walk 30 minutes without difficulty, it is safe to try more intense activity such as jogging, treadmill, bicycling, low-impact aerobics, swimming, etc. b. Save the most intensive and strenuous activity for last such as sit-ups, heavy lifting, contact sports, etc  Refrain from any heavy lifting or straining until you are off narcotics for pain control.   c. DO NOT PUSH THROUGH PAIN.  Let pain be your guide: If it hurts to do something, don't do it.  Pain is your body warning you to avoid that activity for another week until the pain goes down. d. You may drive when you are no longer taking prescription pain medication, you can comfortably wear a seatbelt, and you can safely maneuver your car and apply brakes. e. Dennis Bast may have sexual intercourse when it is comfortable.  8. FOLLOW UP in our office a. Please call [facility] at [phone] to set up an appointment to see your surgeon in the office for a follow-up appointment approximately 2-3 weeks after your surgery. b. Make sure that you call for this appointment the day you arrive home to insure a  convenient appointment time. 9.  IF YOU HAVE DISABILITY OR FAMILY LEAVE FORMS, BRING THEM TO THE OFFICE FOR PROCESSING.  DO NOT GIVE THEM TO YOUR DOCTOR.  WHEN TO CALL us [phone]: 1. Poor pain control 2. Reactions / problems with new medications (rash/itching, nausea, etc)  3. Fever over 101.5 F (38.5 C) 4. Inability to urinate 5. Nausea and/or vomiting 6. Worsening swelling or bruising 7. Continued bleeding from incision. 8. Increased pain, redness, or drainage from the incision   The clinic staff is available to answer your questions during regular business hours (8:30am-5pm).  Please dont hesitate to call and ask to speak to one of our nurses for clinical concerns.   If you have a medical emergency, go to the nearest emergency room or call 911.  A surgeon from [facility] is always on call at the hospitals in [facility], [place], [place], [place], [place]  [address] ?  [address] [address], [place], [place]   [address] MAIN: [phone] ? TOLL FREE: [phone] ? FAX: [phone] [email or web address]

## 2016-12-03 NOTE — Anesthesia Postprocedure Evaluation (Signed)
Anesthesia Post Note  Patient: Grace Singh  Procedure(s) Performed: Procedure(s) (LRB): LAPAROSCOPIC RESECTION OF POSTERIOR GASTRIC GIST WITH OMENTOPEXY. INCISIONAL UMBILICAL HERNIA REPAIR.  (N/A)  Patient location during evaluation: PACU Anesthesia Type: General Level of consciousness: sedated and patient cooperative Pain management: pain level controlled Vital Signs Assessment: post-procedure vital signs reviewed and stable Respiratory status: spontaneous breathing Cardiovascular status: stable Anesthetic complications: no       Last Vitals:  Vitals:   12/03/16 1230 12/03/16 1245  BP: 133/70 125/80  Pulse: 84 84  Resp: 11 12  Temp:  36.8 C    Last Pain:  Vitals:   12/03/16 1245  TempSrc:   PainSc: [place]

## 2016-12-03 NOTE — Progress Notes (Signed)
NGT size 16 french inserted R nare. NGT placement verified by aspiration and ausculation. Placement verified by  Leo Grosser RN and Gerilyn Nestle RN. With Immediate placement Green brown bile was present, and then was hooked up to LIS. Pt tolerated it well and is no is now not complaining of any choking or pain in the throat.

## 2016-12-03 NOTE — Transfer of Care (Signed)
Immediate Anesthesia Transfer of Care Note  Patient: Grace Singh  Procedure(s) Performed: Procedure(s): LAPAROSCOPIC RESECTION OF POSTERIOR GASTRIC GIST WITH OMENTOPEXY. INCISIONAL UMBILICAL HERNIA REPAIR.  (N/A)  Patient Location: PACU  Anesthesia Type:General  Level of Consciousness: awake, alert  and oriented  Airway & Oxygen Therapy: Patient Spontanous Breathing and Patient connected to face mask oxygen  Post-op Assessment: Report given to RN and Post -op Vital signs reviewed and stable  Post vital signs: Reviewed and stable  Last Vitals:  Vitals:   12/03/16 0550  BP: 120/69  Pulse: 88  Resp: 16  Temp: 36.8 C    Last Pain:  Vitals:   12/03/16 0550  TempSrc: Oral         Complications: No apparent anesthesia complications

## 2016-12-03 NOTE — Interval H&P Note (Signed)
History and Physical Interval Note:  12/03/2016 7:36 AM  Grace Singh  has presented today for surgery, with the diagnosis of GASTROINTESTINAL STROMAL TUMOR OF STOMACH  The various methods of treatment have been discussed with the patient and family. After consideration of risks, benefits and other options for treatment, the patient has consented to  Procedure(s): LAPAROSCOPIC removal of part of stomach CONTAINING STROMAL TUMOR MASS (N/A) ESOPHAGOGASTRODUODENOSCOPY ENDOSCOPY (N/A) as a surgical intervention .  The patient's history has been reviewed, patient examined, no change in status, stable for surgery.  I have reviewed the patient's chart and labs.  Questions were answered to the patient's satisfaction.    I have re-reviewed the the patient's records, history, medications, and allergies.  I have re-examined the patient.  I again discussed intraoperative plans and goals of post-operative recovery.  The patient agrees to proceed.  KHARLI TROUNG  23-Nov-1966 [id number]  Patient Care Team: [facility] as PCP - General (Internal Medicine) [name], MD as Consulting Physician (Obstetrics and Gynecology) [name], MD as Consulting Physician (Gastroenterology) [name], MD as Consulting Physician (Gastroenterology) [name], MD as Consulting Physician (General Surgery) [name], MD as Consulting Physician (General Surgery) [name], MD as Consulting Physician (Cardiology) [name], MD as Consulting Physician (Endocrinology)  Patient Active Problem List   Diagnosis Date Noted  . GIST (gastrointestinal stroma tumor), malignant, colon ([place]) 11/16/2016  . Lactic acid acidosis 03/29/2014  . Hypercalcemia 03/29/2014  . Severe obesity (BMI >= 40) ([place]) 03/29/2014  . Depression with anxiety 03/29/2014  . Dehydration 03/29/2014  . Nausea with vomiting 03/29/2014  . Chronic pain syndrome 03/29/2014  . Convulsions/seizures ([place])  03/29/2014  . Severe sepsis ([place]) 07/02/2013  . AKI (acute kidney injury) ([place]) 07/02/2013  . UTI (urinary tract infection) 07/02/2013  . Leukocytosis, unspecified 07/02/2013  . Hypokalemia 07/02/2013  . Nephrolithiasis 06/23/2013  . DM2 (diabetes mellitus, type 2) ([place]) 06/23/2013    Past Medical History:  Diagnosis Date  . Anxiety   . 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 Atrophic kidney    LEFT; functions at 33%  . CKD (chronic kidney disease), stage III    In record, but baseline kidney function appears to have GFR > 90; rt kidney  function 67%  . Depression   . Diabetes mellitus, type 2 ([place])   . Edema of both legs    and ankles  . Fibromyalgia   . Headache    migraines  . History of kidney stones   . History of sepsis    05-11-2013  W/ UROSEPSIS---  RESOLIVED//  03-28-2014 SEVERE SEPSIS W/ SIRS---  RESOLVED  . Hyperlipidemia   . Hypertension   . IBS (irritable bowel syndrome)    IBSD  . Iron deficiency anemia   . Normal cardiac stress test    PER PT 2007  . PMB (postmenopausal bleeding)   . Renal calculi    bilateral  . Seasonal asthma    seasonal  . Wears glasses     Past Surgical History:  Procedure Laterality Date  . APPENDECTOMY  AGE 46  . COCCYX REMOVAL  2009  . CYSTOSCOPY W/ URETERAL STENT PLACEMENT Right 06/13/2013   Procedure: CYSTOSCOPY WITH STENT REPLACEMENT, right;  Surgeon: Alexis Frock, MD;  Location: [facility];  Service: Urology;  Laterality: Right;  . CYSTOSCOPY WITH RETROGRADE PYELOGRAM, URETEROSCOPY AND STENT PLACEMENT Right 06/13/2013   Procedure: First Stage Bilateral Ureteroscopy;  Surgeon: Hubbard Robinson  [name], MD;  Location[facility];  Service: Urology;  Laterality: Right;  . CYSTOSCOPY WITH RETROGRADE PYELOGRAM, URETEROSCOPY AND STENT PLACEMENT Bilateral 06/20/2013   Procedure: CYSTOSCOPY WITH RETROGRADE PYELOGRAM, URETEROSCOPY AND STENT PLACEMENT, REMOVAL BILATERAL STENTS;  Surgeon: Alexis Frock,  MD;  Location: [facility];  Service: Urology;  Laterality: Bilateral;  Bilateral stent repalcement with not string  . CYSTOSCOPY WITH STENT PLACEMENT Bilateral 05/11/2013   Procedure: CYSTOSCOPY WITH STENT PLACEMENT;  Surgeon: Alexis Frock, MD;  Location: [facility];  Service: Urology;  Laterality: Bilateral;  . DILATATION & CURETTAGE/HYSTEROSCOPY WITH MYOSURE N/A 06/18/2015   Procedure: DILATATION & CURETTAGE/HYSTEROSCOPY WITH MYOSURE;  Surgeon: [name], MD;  Location: [facility];  Service: Gynecology;  Laterality: N/A;  . EYE SURGERY     radial karatomtomy-bilaterl  . HOLMIUM LASER APPLICATION Right [id number]   Procedure: HOLMIUM LASER APPLICATION, right ureteral stone;  Surgeon: Alexis Frock, MD;  Location: [facility];  Service: Urology;  Laterality: Right;  . HOLMIUM LASER APPLICATION Right [id number]   Procedure: HOLMIUM LASER APPLICATION;  Surgeon: Alexis Frock, MD;  Location: [facility];  Service: Urology;  Laterality: Right;  . HYSTEROSCOPY W/D&C N/A 04/20/2013   Procedure: DILATATION AND CURETTAGE /HYSTEROSCOPY;  Surgeon: [name], MD;  Location: [facility];  Service: Gynecology;  Laterality: N/A;  . LAPAROSCOPIC CHOLECYSTECTOMY  AGE 10  . POLYPECTOMY N/A 04/20/2013   Procedure: POLYPECTOMY;  Surgeon: [name], MD;  Location: [facility];  Service: Gynecology;  Laterality: N/A;  . SPINAL CORD STIMULATOR IMPLANT    . SPINAL CORD STIMULATOR IMPLANT  09/04/2014    Social History   Social History  . Marital status: Married    Spouse name: N/A  . Number of children: N/A  . Years of education: N/A   Occupational History  . Not on file.   Social History Main Topics  . Smoking status: Never Smoker  . Smokeless tobacco: Never Used  . Alcohol use Yes     Comment: rare  . Drug use: No  . Sexual activity: Not on file   Other Topics Concern  . Not on file   Social History Narrative  . No narrative on file     History reviewed. No pertinent family history.  Current Facility-Administered Medications  Medication Dose Route Frequency Provider Last Rate Last Dose  . bupivacaine liposome (EXPAREL) 1.3 % injection 266 mg  20 mL Infiltration On Call to OR [name], MD      . ceFAZolin (ANCEF) 3 g in dextrose 5 % 50 mL IVPB  3 g Intravenous On Call to Paul Smiths, MD       And  . metroNIDAZOLE (FLAGYL) IVPB 500 mg  500 mg Intravenous On Call to OR [name], MD      . Chlorhexidine Gluconate Cloth 2 % PADS 6 each  6 each Topical Once [name], MD       And  . Chlorhexidine Gluconate Cloth 2 % PADS 6 each  6 each Topical Once [name], MD         No Known Allergies  BP 120/69   Pulse 88   Temp 98.3 F (36.8 C) (Oral)   Resp 16   LMP 03/07/2014   SpO2 100%   Labs: Results for orders placed or performed during the hospital encounter of 12/03/16 (from the past 48 hour(s))  Glucose, capillary     Status: Abnormal   Collection Time: 12/03/16  5:38 AM  Result Value Ref Range   Glucose-Capillary 145 (H) 65 - 99 mg/dL   Comment 1 Notify RN     Imaging / Studies: No results found.   [name], M.D., F.A.C.S. Gastrointestinal and Minimally Invasive Surgery [facility] [address], [place], [place] [address] [phone] Main / Paging  12/03/2016 7:36 AM    Kahlee Metivier C.

## 2016-12-03 NOTE — Anesthesia Procedure Notes (Signed)
Procedure Name: Intubation Date/Time: 12/03/2016 7:43 AM Performed by: [place] Keerthana Vanrossum C Pre-anesthesia Checklist: Patient identified, Emergency Drugs available, Suction available, Timeout performed and Patient being monitored Patient Re-evaluated:Patient Re-evaluated prior to inductionOxygen Delivery Method: Circle system utilized Preoxygenation: Pre-oxygenation with 100% oxygen Intubation Type: IV induction, Cricoid Pressure applied and Rapid sequence Laryngoscope Size: Mac and 3 Grade View: Grade II Tube type: Oral Tube size: 7.0 mm Number of attempts: 1 Airway Equipment and Method: Stylet Placement Confirmation: ETT inserted through vocal cords under direct vision,  positive ETCO2,  CO2 detector and breath sounds checked- equal and bilateral Secured at: 22 cm Tube secured with: Tape Dental Injury: Teeth and Oropharynx as per pre-operative assessment

## 2016-12-03 NOTE — Interval H&P Note (Signed)
History and Physical Interval Note:  12/03/2016 7:35 AM  Grace Singh  has presented today for surgery, with the diagnosis of GASTROINTESTINAL STROMAL TUMOR OF STOMACH  The various methods of treatment have been discussed with the patient and family. After consideration of risks, benefits and other options for treatment, the patient has consented to  Procedure(s): LAPAROSCOPIC removal of part of stomach CONTAINING STROMAL TUMOR MASS (N/A) ESOPHAGOGASTRODUODENOSCOPY ENDOSCOPY (N/A) as a surgical intervention .  The patient's history has been reviewed, patient examined, no change in status, stable for surgery.  I have reviewed the patient's chart and labs.  Questions were answered to the patient's satisfaction.     Jamarl Pew C.

## 2016-12-03 NOTE — H&P (Signed)
Grace Singh 09/28/2016 10:07 AM Location: [facility] Patient #: [id number] DOB: [birth year] Married / Language: Grace Singh / Race: White Female  Patient Care Team: [facility] as PCP - General (Internal Medicine) [name], MD as Consulting Physician (Obstetrics and Gynecology) [name], MD as Consulting Physician (Gastroenterology) [name], MD as Consulting Physician (Gastroenterology) [name], MD as Consulting Physician (General Surgery) [name], MD as Consulting Physician (General Surgery) [name], MD as Consulting Physician (Cardiology) [name], MD as Consulting Physician (Endocrinology)   History of Present Illness [name] MD; 09/28/2016 11:06 AM) The patient is a 50 year old female who presents with a gastrointestinal foreign body. Note for "Gastrointestinal foreign body": Patient sent for surgical consultation requested Dr. Aletha Halim outlaw and [name] with gastroenterology. Concern for gastric mass. 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  Pleasant 50 year old female. She comes today with her husband and granddaughter. They're babysitting day. Diabetic on insulin. Nonsmoker. Morbidly obese. Has chronic lumbar back and sacrococcygeal pain on chronic pain medications as well as spinal stimulator. On chronic narcotics. Has strong family history of unknown etiology cancers. Brother died of metastatic disease in his 75s. Another brother on hospice. She's never regimen testing.  Regardless she's had numerous abdominal complaints for her life. Has had a lot of loose stools and diarrhea. 2-10 bowel movements a day at times. Had cholecystectomy age 65. Appendectomy age 71. She has some fibroids for which she's had D&Cs done. History of kidney stones treated by Dr. [name] with [facility]. No recent stones. Diagnosed with irritable bowel syndrome. Improved with the help of gastroenterology.  Sounds like Lomotil is helping. Has had upper abdominal intermittent crampy abdominal complaints as well. Early satiety and fullness. Bloating. Occasional nausea or vomiting. Poor tolerance to spicy foods. Prilosec somewhat helps. Intermittently on that. Seems to help heartburn or reflux. Underwent upper and lower endoscopy. Colonoscopy rather underwhelming with just a polyp upper endoscopy showed some active gastritis and Helicobacter pylori. Treated. Also had a submucosal nodule. About 4 cm. Referred to [facility] gastroenterology for endoscopic ultrasound. Biopsy shows spindle cell neoplasm. Looks like it's located on the posterior wall of the mid body versus the antrum. Surgical consultation requested.  No personal nor family history of colon cancer, inflammatory bowel disease, allergy such as Celiac Sprue, dietary/dairy problems, colitis, ulcers nor gastritis. No recent sick contacts/gastroenteritis. No travel outside the country. No changes in diet. No dysphagia to solids or liquids. No hematochezia, hematemesis, coffee ground emesis. No evidence of prior gastric/peptic ulceration. Mother had breast cancer. Get screened rather regularly. Has never seen genetic counseling. She is morbidly obese. She's had some intermittent unintentional weight loss. Been usually can gain it back gradually. Hemoglobin A1c around 6. Sugars around the 120s. Followed by cardiologist for hyperlipidemia. Gets tired after walking about 10/15 minutes. Most of it is related to her severe tailbone and lower back pain. Claims she's had negative cardiac workups for any cardiopulmonary issues. She does not smoke.   Other Problems [name], CMA; 09/28/2016 10:08 AM) Anxiety Disorder  Arthritis  Asthma  Back Pain  Cholelithiasis  Depression  Diabetes Mellitus  Gastroesophageal Reflux Disease  Hemorrhoids  High blood pressure  Hypercholesterolemia  Kidney Stone  Other disease,  cancer, significant illness   Past Surgical History [name], CMA; 09/28/2016 10:08 AM) Appendectomy  Gallbladder Surgery - Laparoscopic  Oral Surgery  Spinal Surgery - Lower Back   Diagnostic Studies History [name], CMA; 09/28/2016 10:08 AM) Colonoscopy  within last  year Mammogram  within last year Pap Smear  1-5 years ago  Allergies [name], CMA; 09/28/2016 10:08 AM) No Known Drug Allergies 09/28/2016  Medication History [name], CMA; 09/28/2016 10:09 AM) Vascepa (1GM Capsule, Oral) Active. Topiramate (50MG  Tablet, Oral) Active. TiZANidine HCl (4MG  Tablet, Oral) Active. Pioglitazone HCl (45MG  Tablet, Oral) Active. Omeprazole (40MG  Capsule DR, Oral) Active. MetFORMIN HCl (1000MG  Tablet, Oral) Active. Lantus SoloStar (100UNIT/ML Soln Pen-inj, Subcutaneous) Active. Lisinopril (20MG  Tablet, Oral) Active. LamoTRIgine (200MG  Tablet, Oral) Active. HydroCHLOROthiazide (25MG  Tablet, Oral) Active. Gemfibrozil (600MG  Tablet, Oral) Active. Clarithromycin (500MG  Tablet, Oral) Active. Citalopram Hydrobromide (40MG  Tablet, Oral) Active. BuPROPion HCl (75MG  Tablet, Oral) Active. BD Pen Needle Nano U/F (32G X 4 MM Misc,) Active. Sucralfate (1GM Tablet, Oral) Active. Rosuvastatin Calcium (20MG  Tablet, Oral) Active. Omega-3-acid Ethyl Esters (1GM Capsule, Oral) Active. Ondansetron (4MG  Tablet Disint, Oral) Active. Albuterol Sulfate HFA (108 (90 Base)MCG/ACT Aerosol Soln, Inhalation) Active. Medications Reconciled  Social History [name], [place]; 09/28/2016 10:08 AM) Alcohol use  Occasional alcohol use. Caffeine use  Carbonated beverages. No drug use  Tobacco use  Never smoker.  Family History [name], [place]; 09/28/2016 10:08 AM) Arthritis  Father, Mother. Breast Cancer  Mother. Cancer  Brother, Family Members In General. Cerebrovascular Accident  Father. Colon Cancer  Brother, Family Members In General. Diabetes  Mellitus  Family Members In General, Father. Heart Disease  Father. Heart disease in female family member before age 56  Hypertension  Brother, Family Members In [place], Father. Ischemic Bowel Disease  Brother, Family Members In General. Kidney Disease  Family Members In General. Migraine Headache  Brother. Rectal Cancer  Brother.  Pregnancy / Birth History [name], CMA; 09/28/2016 10:08 AM) Age at menarche  85 years. Age of menopause  31-50 Gravida  0 Para  0    Review of Systems [name] CMA; 09/28/2016 10:08 AM) General Present- Fatigue, Weight Gain and Weight Loss. Not Present- Appetite Loss, Chills, Fever and Night Sweats. Skin Not Present- Change in Wart/Mole, Dryness, Hives, Jaundice, New Lesions, Non-Healing Wounds, Rash and Ulcer. HEENT Present- Hoarseness, Seasonal Allergies and Wears glasses/contact lenses. Not Present- Earache, Hearing Loss, Nose Bleed, Oral Ulcers, Ringing in the Ears, Sinus Pain, Sore Throat, Visual Disturbances and Yellow Eyes. Respiratory Not Present- Bloody sputum, Chronic Cough, Difficulty Breathing, Snoring and Wheezing. Breast Not Present- Breast Mass, Breast Pain, Nipple Discharge and Skin Changes. Cardiovascular Present- Swelling of Extremities. Not Present- Chest Pain, Difficulty Breathing Lying Down, Leg Cramps, Palpitations, Rapid Heart Rate and Shortness of Breath. Gastrointestinal Present- Abdominal Pain, Chronic diarrhea, Constipation, Gets full quickly at meals, Indigestion, Nausea, Rectal Pain and Vomiting. Not Present- Bloating, Bloody Stool, Change in Bowel Habits, Difficulty Swallowing, Excessive gas and Hemorrhoids. Female Genitourinary Present- Frequency and Nocturia. Not Present- Painful Urination, Pelvic Pain and Urgency. Musculoskeletal Present- Back Pain, Joint Pain, Joint Stiffness, Muscle Pain and Swelling of Extremities. Not Present- Muscle Weakness. Psychiatric Present- Anxiety, Bipolar and Depression. Not  Present- Change in Sleep Pattern, Fearful and Frequent crying. Endocrine Not Present- Cold Intolerance, Excessive Hunger, Hair Changes, Heat Intolerance, Hot flashes and New Diabetes. Hematology Not Present- Blood Thinners, Easy Bruising, Excessive bleeding, Gland problems, HIV and Persistent Infections.  Vitals [name] CMA; 09/28/2016 10:10 AM) 09/28/2016 10:09 AM Weight: 262 lb Height: 66in Body Surface Area: 2.24 m Body Mass Index: 42.29 kg/m  Temp.: 98.10F(Temporal)  Pulse: 72 (Regular)  BP: 134/72 (Sitting, Left Arm, Standard)   BP 120/69   Pulse 88   Temp 98.3 F (36.8 C) (Oral)  Resp 16   LMP 03/07/2014   SpO2 100%      Physical Exam [name] MD; 09/28/2016 11:08 AM) General Mental Status-Alert. General Appearance-Not in acute distress, Not Sickly. Orientation-Oriented X3. Hydration-Well hydrated. Voice-Normal. Note: Nontoxic. Not sickly.   Integumentary Global Assessment Upon inspection and palpation of skin surfaces of the - Axillae: non-tender, no inflammation or ulceration, no drainage. and Distribution of scalp and body hair is normal. General Characteristics Temperature - normal warmth is noted.  Head and Neck Head-normocephalic, atraumatic with no lesions or palpable masses. Face Global Assessment - atraumatic, no absence of expression. Neck Global Assessment - no abnormal movements, no bruit auscultated on the right, no bruit auscultated on the left, no decreased range of motion, non-tender. Trachea-midline. Thyroid Gland Characteristics - non-tender.  Eye Eyeball - Left-Extraocular movements intact, No Nystagmus. Eyeball - Right-Extraocular movements intact, No Nystagmus. Cornea - Left-No Hazy. Cornea - Right-No Hazy. Sclera/Conjunctiva - Left-No scleral icterus, No Discharge. Sclera/Conjunctiva - Right-No scleral icterus, No Discharge. Pupil - Left-Direct reaction to light normal. Pupil  - Right-Direct reaction to light normal.  ENMT Ears Pinna - Left - no drainage observed, no generalized tenderness observed. Right - no drainage observed, no generalized tenderness observed. Nose and Sinuses External Inspection of the Nose - no destructive lesion observed. Inspection of the nares - Left - quiet respiration. Right - quiet respiration. Mouth and Throat Lips - Upper Lip - no fissures observed, no pallor noted. Lower Lip - no fissures observed, no pallor noted. Nasopharynx - no discharge present. Oral Cavity/Oropharynx - Tongue - no dryness observed. Oral Mucosa - no cyanosis observed. Hypopharynx - no evidence of airway distress observed.  Chest and Lung Exam Inspection Movements - Normal and Symmetrical. Accessory muscles - No use of accessory muscles in breathing. Palpation Palpation of the chest reveals - Non-tender. Auscultation Breath sounds - Normal and Clear.  Cardiovascular Auscultation Rhythm - Regular. Murmurs & Other Heart Sounds - Auscultation of the heart reveals - No Murmurs and No Systolic Clicks.  Abdomen Inspection Inspection of the abdomen reveals - No Visible peristalsis and No Abnormal pulsations. Umbilicus - No Bleeding, No Urine drainage. Palpation/Percussion Palpation and Percussion of the abdomen reveal - Soft, Non Tender, No Rebound tenderness, No Rigidity (guarding) and No Cutaneous hyperesthesia. Note: Morbidly obese but soft. Some left upper quadrant discomfort to light palpation but no guarding or peritonitis. No epigastric or right upper quadrant pain. No pain in lower abdomen. Well-healed laparoscopic incisions without diastases or hernia. No umbilical hernia. No guarding.   Female Genitourinary Sexual Maturity Tanner 5 - Adult hair pattern. Note: No inguinal hernias. No inguinal lymphadenopathy.   Rectal Note: Deferred given colonoscopy done in August   Peripheral Vascular Upper Extremity Inspection - Left - No Cyanotic  nailbeds, Not Ischemic. Right - No Cyanotic nailbeds, Not Ischemic.  Neurologic Neurologic evaluation reveals -normal attention span and ability to concentrate, able to name objects and repeat phrases. Appropriate fund of knowledge , normal sensation and normal coordination. Mental Status Affect - not angry, not paranoid. Cranial Nerves-Normal Bilaterally. Gait-Normal.  Neuropsychiatric Mental status exam performed with findings of-able to articulate well with normal speech/language, rate, volume and coherence, thought content normal with ability to perform basic computations and apply abstract reasoning and no evidence of hallucinations, delusions, obsessions or homicidal/suicidal ideation. Note: Calm. Conversational. Not tearful. Not belligerent.   Musculoskeletal Global Assessment Spine, Ribs and Pelvis - no instability, subluxation or laxity. Right Upper Extremity - no instability, subluxation or laxity.  Note: Sensitive and lumbar and specially sacral area. Has to sit on a cushion/doughnut.   Lymphatic Head & Neck  General Head & Neck Lymphatics: Bilateral - Description - No Localized lymphadenopathy. Axillary  General Axillary Region: Bilateral - Description - No Localized lymphadenopathy. Femoral & Inguinal  Generalized Femoral & Inguinal Lymphatics: Left - Description - No Localized lymphadenopathy. Right - Description - No Localized lymphadenopathy.  CLINICAL DATA:  Abdominal pain.  Reflux.  Early satiety.  EXAM: NUCLEAR MEDICINE GASTRIC EMPTYING SCAN  TECHNIQUE: After oral ingestion of radiolabeled meal, sequential abdominal images were obtained for 120 minutes. Residual percentage of activity remaining within the stomach was calculated at 60 and 120 minutes.  RADIOPHARMACEUTICALS:  2.2 mCi Tc-55m sulfur colloid in standardized meal  COMPARISON:  [facility] 04/12/2016.  CT 03/28/2016.  FINDINGS: Expected location of the stomach in the left upper  quadrant. Ingested meal empties the stomach gradually over the course of the study with 21% retention at 60 min and 2% retention at 120 min (normal retention less than 30% at a 120 min).  IMPRESSION: Normal gastric emptying study.   Electronically Signed   By: Marcello Moores  Register   On: 10/08/2016 09:52  Assessment & Plan  NAUSEA AND VOMITING IN ADULT (R11.2) Impression: Occasional nausea vomiting with history of gastritis and Helicobacter pylori infection. Treated for that. Using Prilosec. Recommend that she stay on that regularly.  I think she is at moderate high risk for having delayed gastric emptying/gastroparesis between her diabetes and her narcotic use and spine/chronic pain issues. Would like to get a gastric emptying study. I noted is not ideal with her on narcotics but I think it is more realistic to have it done while she is on her medications to see how well that functions. If that is severely affected, she may need further interventions depending with gastroenterology thinks.    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 I think she would benefit from wedge resection. Reasonable do a minimally invasive laparoscopic/robotic approach. Would like to do an interoperative endoscopy as well to help localize the region wedge off.  I did caution him that usually these things are rather asymptomatic one small and doing surgery is not going to radically improve her abdominal complaints. However think is a good idea get new this before becomes more serious problem and then regroup.  Hopefully or catching early enough that just surgery is needed and not post-adjuvant Gleevec spelled most likely will discussed at tumor board postoperatively.  Given the fact that she has 1 sibling that died of metastatic disease in his [id number] of uncertain etiology another one that I think has it as well, I think she  would benefit from genetic testing. Try and see if we can help set that up. See if he can be done now vs due to through the tumor board once the surgeries done. Current Plans I recommended obtaining preoperative cardiac clearance. I am concerned about the health of the patient and the ability to tolerate the operation. Therefore, we will request clearance by cardiology to better assess operative risk & see if a reevaluation, further workup, etc is needed. Also recommendations on how medications such as for anticoagulation and blood pressure should be managed/held/restarted after surgery. The anatomy & physiology of the foregut and anti-reflux mechanism was discussed. The pathophysiology of Gastrointestinal stromal tumors (GISTs) and differential diagnosis was discussed. Natural history risks without surgery was discussed. The patient's symptoms  are not adequately controlled by medicines and other non-operative treatments. I feel the risks of no intervention will lead to serious problems that outweigh the operative risks; therefore, I recommended surgery to remove the mass. Most likely involving partial gastrectomy wedge resection. Need for a thorough workup to rule out the differential diagnosis and plan treatment was explained. I explained laparoscopic techniques with possible need for an open approach. I will need to remove the mass en bloc. Therefore I will need extraction incision. Possible hand assist port as well.  Risks such as bleeding, infection, abscess, leak, need for further treatment, heart attack, death, and other risks were discussed. I noted a good likelihood this will help address the problem. Goals of post-operative recovery were discussed as well. Possibility that this will not correct all symptoms was explained. Post-operative dysphagia, need for short-term liquid & pureed diet, possible need for medicines to help control symptoms in addition to surgery were discussed. We will work to  minimize complications. Possible need for Gleevec our Sutent postoperatively depending on the aggressiveness of the tumor was discussed as well. That would involve medical oncology consultation. Educational handouts further explaining the pathology, treatment options was given as well. Questions were answered. The patient expressed understanding & wished to proceed with surgery.  You are being scheduled for surgery - Our schedulers will call you.  You should hear from our office's scheduling department within 5 working days about the location, date, and time of surgery. We try to make accommodations for patient's preferences in scheduling surgery, but sometimes the OR schedule or the surgeon's schedule prevents [place] from making those accommodations.  If you have not heard from our office [phone]) in 5 working days, call the office and ask for your surgeon's nurse.  If you have other questions about your diagnosis, plan, or surgery, call the office and ask for your surgeon's nurse.   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  Sounds like she's had some improvement on some medication. I think it may be Lomotil since her main complaints were diarrhea based. Narcotics are somewhat constipating as well.  They be related to be on some type of fiber bowel regimen such as MiraLAX to help stabilize things and follow closely with gastroenterology. Sounds like it still rather a challenge for her beginning easier to manage. 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 Gastrointestinal and Minimally Invasive Surgery [facility] [address], [place], [place] [address] [phone] Main / Paging

## 2016-12-03 NOTE — Op Note (Signed)
12/03/2016  11:16 AM  PATIENT:  Grace Singh  50 y.o. female  Patient Care Team: [facility] as PCP - General (Internal Medicine) [name], MD as Consulting Physician (Obstetrics and Gynecology) [name], MD as Consulting Physician (Gastroenterology) [name], MD as Consulting Physician (Gastroenterology) [name], MD as Consulting Physician (General Surgery) [name], MD as Consulting Physician (Cardiology) [name], MD as Consulting Physician (Endocrinology)  PRE-OPERATIVE DIAGNOSIS:  GASTROINTESTINAL STROMAL TUMOR OF STOMACH  POST-OPERATIVE DIAGNOSIS:    GASTROINTESTINAL STROMAL TUMOR OF STOMACH PERIUMBILICAL INCISIONAL HERNIA  PROCEDURE:   LAPAROSCOPIC RESECTION OF POSTERIOR GASTRIC GIST WITH OMENTOPEXY PRIMARY INCISIONAL UMBILICAL HERNIA REPAIR.   SURGEON:  [name], MD  ASSISTANT: Romana Juniper, MD   ANESTHESIA:   local and general  EBL:  Total I/O In: 1300 [I.V.:1300] Out: 43 [Blood:50]  Delay start of Pharmacological VTE agent (>24hrs) due to surgical blood loss or risk of bleeding:  no  DRAINS: - NGT  SPECIMEN:   DISPOSITION OF SPECIMEN:  N/A  COUNTS:  YES  PLAN OF CARE: Admit to inpatient   PATIENT DISPOSITION:  PACU - hemodynamically stable.  INDICATION: Patient with mass on stomach.  Biopsy consistent with stromal tumor.  Recommendation made for surgical resection.  Wished to delay surgery a few months.  Eventually ready.  The anatomy & physiology of the foregut and anti-reflux mechanism was discussed.  The pathophysiology of Gastrointestinal stromal tumors (GISTs) and differential diagnosis was discussed.  Natural history risks without surgery was discussed.   The patient's symptoms are not adequately controlled by medicines and other non-operative treatments.  I feel the risks of no intervention will lead to serious problems that outweigh the operative risks; therefore, I recommended  surgery to remove the mass.  Most likely involving partial gastrectomy wedge resection.  Need for a thorough workup to rule out the differential diagnosis and plan treatment was explained.  I explained laparoscopic techniques with possible need for an open approach.  I will need to remove the mass en bloc.  Therefore I will need extraction incision.  Possible hand assist port as well.  Risks such as bleeding, infection, abscess, leak, injury to other organs, need for repair of tissues / organs, need for further treatment, heart attack, death, and other risks were discussed.   I noted a good likelihood this will help address the problem.  Goals of post-operative recovery were discussed as well.  Possibility that this will not correct all symptoms was explained.  Post-operative dysphagia, need for short-term liquid & pureed diet, possible need for medicines to help control symptoms in addition to surgery were discussed.  We will work to minimize complications.  Possible need for Gleevec our Sutent postoperatively depending on the aggressiveness of the tumor was discussed as well.  That would involve medical oncology consultation.  Educational handouts further explaining the pathology, treatment options was given as well.  Questions were answered.  The patient expressed understanding & wished to proceed with surgery.  OR FINDINGS:  Patient had a very exophytic mass on the posterior stomach.  Distal antrum within a few centimeters of the pylorus.  5 cm in size.  Omentum adherent to it.  No erosion through the mucosa.  Excised with Harmonic scalpel.  The posterior gastric wall wound closed transversely with running 20 V lock absorbable suture.  Negative air leak test.  No evidence of cancer or carcinomatosis and peritoneal cavity.    DESCRIPTION:  Informed consent was confirmed. The patient underwent  general anaesthesia without difficulty. The patient was positioned appropriately. VTE prevention in place.  The patient's abdomen was clipped, prepped, & draped in a sterile fashion. Surgical timeout confirmed our plan.   The patient was positioned in reverse Trendelenburg. Abdominal entry with a 67mm laparoscopic port was gained using optical entry technique in the left upper abdomen. Entry was clean. I induced carbon dioxide insufflation. Camera inspection revealed no injury. Extra ports were carefully placed under direct laparoscopic visualization.   Patient had an obvious hernia at the umbilical stalk consistent with her prior cholecystectomy.  I placed the larger 12 mm port through that hernia.  Diagnostic laparoscopy revealed no significant abnormalities. I could find the stomach.   There is no evidence of any mass on the anterior surface.  One end and transected through the greater omentum, sparing the gastroepiploic vessels.  Cons of the lesser sac.  At from the distal antrum to the mid greater curvature body.  With that we could elevate the posterior gastric wall and easily identify a mass protruding off the posterior antral wall..  There was some greater omentum adherent to it.  Eventually transected the greater omentum off it, leaving a 2 cm cuff of omentum to have some en bloc tissue with it.  With that the posterior stomach was completely exposed.  Could see the mass.  It was about 5 cm in size at least.  Quite exophytic.  White changes at the cirrhosis suspicious for at least a T3 lesion.  It was a little too distal to elevated wedge op without severe narrowing at the pylorus and duodenal bulb.  Therefore we excised it without stapling.  I used a Harmonic Scalpel and entered into the stomach 2 cm proximal to the proximal border of the mass.  I came around it circumferentially, having a 1 cm border near the pylorus distally.  It came out intact.  There was no evidence of any ulceration through the mucosa.  We placed it in an Endo Catch bag.  I removed it out the umbilical incisional hernia.  Did have to  open that up a little bit to get it out.  I had anesthesia switch to nasogastric tube and advance it.  I placed the tip into the duodenal bulb and advanced it further 10 cm to make sure was well into the second and third part of the duodenum.  I then proceeded to close the large posterior gastric wound transversely using 2-0 V lock sutures with intracorporeal laparoscopic suturing, starting at the corners & running towards the center of the wound.  On stitch at the greater curvature corner and the other at the lesser curvature corner.  Then running it in a running [place] fashion towards the center to help imbricate the mucosa well.    This provided night brought transverse closure without any narrowing of the stomach.   I then clamped the duodenum.  Anesthesia insufflated the nasogastric tube with 2 L oxygen.  This insufflated the stomach well.  We had the patient in Trendelenburg with significant irrigation.  Stomach distended well without any leak of air.  This was consistent with a negative air leak test.  Nasogastric tube secured and aspirated until was nice and flat.  The stomach was nice and patent.  The pylorus had not been breached.  Anterior gastric wall normal.  Antrum not narrowed There was no narrowing at the pylorus or duodenal bulb.  I brought the V lock sutures out through some greater omentum and ran  that to help tack the greater omentum over the closure to provide an omentopexy over the sewn closure.  This provided a gentle fold on the posterior gastric wall but no major angulation.  Nasogastric tube was taped in place.  Patient extubated recovery room. I updated the status of the patient to the patient's husband.  I made recommendations.  Keep the nasogastric tube.  Plan esophagram in the morning to rule out leak or obstruction.  If no evidence of that, then nasogastric clamping trials.  If tolerates that for 24 hours, remove nasogastric tube on postop day two and advance diet as tolerated.  I answered questions.  Understanding & appreciation was expressed.   [name], M.D., F.A.C.S. Gastrointestinal and Minimally Invasive Surgery [facility] [address], [place], [place] [address] [phone] Main / Paging

## 2016-12-04 ENCOUNTER — Observation Stay (HOSPITAL_COMMUNITY): Payer: Commercial Managed Care - HMO

## 2016-12-04 DIAGNOSIS — Z833 Family history of diabetes mellitus: Secondary | ICD-10-CM | POA: Diagnosis not present

## 2016-12-04 DIAGNOSIS — Z794 Long term (current) use of insulin: Secondary | ICD-10-CM | POA: Diagnosis not present

## 2016-12-04 DIAGNOSIS — G8929 Other chronic pain: Secondary | ICD-10-CM | POA: Diagnosis present

## 2016-12-04 DIAGNOSIS — I10 Essential (primary) hypertension: Secondary | ICD-10-CM | POA: Diagnosis present

## 2016-12-04 DIAGNOSIS — E1165 Type 2 diabetes mellitus with hyperglycemia: Secondary | ICD-10-CM | POA: Diagnosis present

## 2016-12-04 DIAGNOSIS — Z803 Family history of malignant neoplasm of breast: Secondary | ICD-10-CM | POA: Diagnosis not present

## 2016-12-04 DIAGNOSIS — Z8249 Family history of ischemic heart disease and other diseases of the circulatory system: Secondary | ICD-10-CM | POA: Diagnosis not present

## 2016-12-04 DIAGNOSIS — C49A2 Gastrointestinal stromal tumor of stomach: Secondary | ICD-10-CM | POA: Diagnosis present

## 2016-12-04 DIAGNOSIS — R808 Other proteinuria: Secondary | ICD-10-CM | POA: Diagnosis present

## 2016-12-04 DIAGNOSIS — K219 Gastro-esophageal reflux disease without esophagitis: Secondary | ICD-10-CM | POA: Diagnosis present

## 2016-12-04 DIAGNOSIS — Z6841 Body Mass Index (BMI) 40.0 and over, adult: Secondary | ICD-10-CM | POA: Diagnosis not present

## 2016-12-04 DIAGNOSIS — K580 Irritable bowel syndrome with diarrhea: Secondary | ICD-10-CM | POA: Diagnosis present

## 2016-12-04 DIAGNOSIS — F418 Other specified anxiety disorders: Secondary | ICD-10-CM | POA: Diagnosis present

## 2016-12-04 DIAGNOSIS — E7800 Pure hypercholesterolemia, unspecified: Secondary | ICD-10-CM | POA: Diagnosis present

## 2016-12-04 DIAGNOSIS — Z87442 Personal history of urinary calculi: Secondary | ICD-10-CM | POA: Diagnosis not present

## 2016-12-04 DIAGNOSIS — Z823 Family history of stroke: Secondary | ICD-10-CM | POA: Diagnosis not present

## 2016-12-04 DIAGNOSIS — K432 Incisional hernia without obstruction or gangrene: Secondary | ICD-10-CM | POA: Diagnosis present

## 2016-12-04 DIAGNOSIS — M549 Dorsalgia, unspecified: Secondary | ICD-10-CM | POA: Diagnosis present

## 2016-12-04 DIAGNOSIS — Z8261 Family history of arthritis: Secondary | ICD-10-CM | POA: Diagnosis not present

## 2016-12-04 DIAGNOSIS — Z800 Family history of malignant neoplasm of digestive organs: Secondary | ICD-10-CM | POA: Diagnosis not present

## 2016-12-04 DIAGNOSIS — Z79899 Other long term (current) drug therapy: Secondary | ICD-10-CM | POA: Diagnosis not present

## 2016-12-04 DIAGNOSIS — M797 Fibromyalgia: Secondary | ICD-10-CM | POA: Diagnosis present

## 2016-12-04 LAB — GLUCOSE, CAPILLARY
GLUCOSE-CAPILLARY: 81 mg/dL (ref 65–99)
GLUCOSE-CAPILLARY: 127 mg/dL — AB (ref 65–99)
Glucose-Capillary: 73 mg/dL (ref 65–99)
Glucose-Capillary: 155 mg/dL — ABNORMAL HIGH (ref 65–99)

## 2016-12-04 MED ORDER — IOPAMIDOL (ISOVUE-300) INJECTION 61%: 150.0000 mL | Freq: Once | INTRAVENOUS | Status: DC | PRN

## 2016-12-04 MED ORDER — IOPAMIDOL (ISOVUE-300) INJECTION 61%
INTRAVENOUS | Status: AC
  Administered 2016-12-04: 150 mL
  Filled 2016-12-04: qty 150

## 2016-12-04 NOTE — Progress Notes (Addendum)
1 Day Post-Op  Subjective: Passing flatus, pain controlled, complains of both hands with some numbness  Objective: Vital signs in last 24 hours: Temp:  [97.9 F (36.6 C)-99.4 F (37.4 C)] 97.9 F (36.6 C) (12/30 0521) Pulse Rate:  [73-96] 89 (12/30 0521) Resp:  [10-16] 16 (12/30 0521) BP: (92-133)/(56-80) 122/71 (12/30 0521) SpO2:  [95 %-99 %] 98 % (12/30 0521) Weight:  [124.3 kg (274 lb)] 124.3 kg (274 lb) (12/29 1315) Last BM Date: 12/02/16  Intake/Output from previous day: 12/29 0701 - 12/30 0700 In: 2752.5 [P.O.:300; I.V.:2262.5] Out: [id number]; Emesis/NG output:1000; Blood:50] Intake/Output this shift: No intake/output data recorded.  GI: soft incisions without infection, umbilical incision with some ecchymosis  Lab Results:  No results for input(s): WBC, HGB, HCT, PLT in the last 72 hours. BMET No results for input(s): NA, K, CL, CO2, GLUCOSE, BUN, CREATININE, CALCIUM in the last 72 hours. PT/INR No results for input(s): LABPROT, INR in the last 72 hours. ABG No results for input(s): PHART, HCO3 in the last 72 hours.  Invalid input(s): PCO2, PO2  Studies/Results: No results found.  Anti-infectives: Anti-infectives    Start     Dose/Rate Route Frequency Ordered Stop   12/03/16 1600  cefoTEtan (CEFOTAN) 2 g in dextrose 5 % 50 mL IVPB     2 g 100 mL/hr over 30 Minutes Intravenous Every 12 hours 12/03/16 1327 12/03/16 1747   12/03/16 0600  ceFAZolin (ANCEF) 3 g in dextrose 5 % 50 mL IVPB     3 g 130 mL/hr over 30 Minutes Intravenous On call to O.R. 12/02/16 1242 12/03/16 0750   12/03/16 0600  metroNIDAZOLE (FLAGYL) IVPB 500 mg     500 mg 100 mL/hr over 60 Minutes Intravenous On call to O.R. 12/02/16 1242 12/03/16 0757      Assessment/Plan: POD 1 lap excision of gist 1. Continue current pain control 2. pulm toilet, ics, ambulation 3. Cont ng today, await esophagram, if negative will clamp ng and start thin liquids 4. Lovenox, scds 5. Dc  foley  Addendum:  Swallow without leak, will start thin liquids and clamp ng  [facility] 12/04/2016

## 2016-12-05 LAB — GLUCOSE, CAPILLARY
GLUCOSE-CAPILLARY: 203 mg/dL — AB (ref 65–99)
GLUCOSE-CAPILLARY: 134 mg/dL — AB (ref 65–99)
GLUCOSE-CAPILLARY: 123 mg/dL — AB (ref 65–99)
Glucose-Capillary: 143 mg/dL — ABNORMAL HIGH (ref 65–99)

## 2016-12-05 NOTE — Progress Notes (Signed)
2 Days Post-Op  Subjective: Passing flatus, tol ng clamping, no n/v, tol clears, pain controlled, voiding, ambulating  Objective: Vital signs in last 24 hours: Temp:  [97.6 F (36.4 C)-99 F (37.2 C)] 97.7 F (36.5 C) (12/31 0651) Pulse Rate:  [76-90] 76 (12/31 0651) Resp:  [16] 16 (12/31 0651) BP: (129-151)/(68-73) 129/73 (12/31 0651) SpO2:  [92 %-97 %] 97 % (12/31 0651) Last BM Date: 12/02/16  Intake/Output from previous day: 12/30 0701 - 12/31 0700 In: 780 [P.O.:780] Out: 931 [Urine:771; Emesis/NG output:160] Intake/Output this shift: No intake/output data recorded.  GI: soft approp tender nondistended umbilical dressing saturated (i removed today) no infection   Lab Results:  No results for input(s): WBC, HGB, HCT, PLT in the last 72 hours. BMET No results for input(s): NA, K, CL, CO2, GLUCOSE, BUN, CREATININE, CALCIUM in the last 72 hours. PT/INR No results for input(s): LABPROT, INR in the last 72 hours. ABG No results for input(s): PHART, HCO3 in the last 72 hours.  Invalid input(s): PCO2, PO2  Studies/Results: Dg Esophagus W/water Sol Cm  Result Date: 12/04/2016 CLINICAL DATA:  One day post resection of gastric GIST tumor with omentopexy. EXAM: ESOPHOGRAM/BARIUM SWALLOW TECHNIQUE: Single contrast examination was performed using water-soluble contrast. The patient was able to drink water soluble contrast via a straw without difficulty. FLUOROSCOPY TIME:  Fluoroscopy Time:  1 minutes and 36 seconds Radiation Exposure Index (if provided by the fluoroscopic device): 126 mGy COMPARISON:  None. FINDINGS: The esophagus shows normal motility and distensibility. Nasogastric tube extends into the mid stomach. Contrast enters the stomach and duodenum normally without evidence of extravasation. IMPRESSION: Unremarkable esophagram with additional evaluation of the stomach with water soluble contrast. No evidence of postoperative leak of contrast or obstruction. Electronically  Signed   By: Aletta Edouard M.D.   On: 12/04/2016 11:18    Anti-infectives: Anti-infectives    Start     Dose/Rate Route Frequency Ordered Stop   12/03/16 1600  cefoTEtan (CEFOTAN) 2 g in dextrose 5 % 50 mL IVPB     2 g 100 mL/hr over 30 Minutes Intravenous Every 12 hours 12/03/16 1327 12/03/16 1747   12/03/16 0600  ceFAZolin (ANCEF) 3 g in dextrose 5 % 50 mL IVPB     3 g 130 mL/hr over 30 Minutes Intravenous On call to O.R. 12/02/16 1242 12/03/16 0750   12/03/16 0600  metroNIDAZOLE (FLAGYL) IVPB 500 mg     500 mg 100 mL/hr over 60 Minutes Intravenous On call to O.R. 12/02/16 1242 12/03/16 0757      Assessment/Plan: POD 2 lap excision of gist 1. Continue current pain control, po pain meds with iv backup 2. pulm toilet, ics, ambulation 3. Will dc ng today, start full liquids 4. Lovenox, scds 5. dispo possibly home tomorrow  [facility] 12/05/2016

## 2016-12-06 LAB — GLUCOSE, CAPILLARY: GLUCOSE-CAPILLARY: 95 mg/dL (ref 65–99)

## 2016-12-06 MED ORDER — OXYCODONE HCL 5 MG PO TABS: 5.0000 mg | ORAL_TABLET | ORAL | 0 refills | Status: DC | PRN

## 2016-12-06 NOTE — Progress Notes (Signed)
Rn reviewed discharge instructions with patient and family. All questions answered.   Paperwork given to patient.   RN and NS could not locate prescription that was supposed to be sent with patient. Patient aware that she can call MD office if she needs this prescription, but patient has perc-7.5/325 at home and will use this if needed. Patient states she has only been taking tylenol while she has been here.   NT rolled patient down with all belongings to family car.

## 2016-12-06 NOTE — Progress Notes (Signed)
3 Days Post-Op  Subjective: Wants to go home, tol fulls, some loose stool, no n/v pain controlled  Objective: Vital signs in last 24 hours: Temp:  [97.8 F (36.6 C)-99.3 F (37.4 C)] 97.8 F (36.6 C) (01/01 0435) Pulse Rate:  [67-85] 67 (01/01 0435) Resp:  [15-16] 15 (01/01 0435) BP: (104-117)/(63-91) 104/63 (01/01 0435) SpO2:  [95 %-97 %] 95 % (01/01 0435) Last BM Date: 12/05/16  Intake/Output from previous day: 12/31 0701 - 01/01 0700 In: 960 [P.O.:960] Out: 925 [Urine:925] Intake/Output this shift: No intake/output data recorded.  GI: soft nontender incisions clean except for some ecchymosis around umbilicus  Lab Results:  No results for input(s): WBC, HGB, HCT, PLT in the last 72 hours. BMET No results for input(s): NA, K, CL, CO2, GLUCOSE, BUN, CREATININE, CALCIUM in the last 72 hours. PT/INR No results for input(s): LABPROT, INR in the last 72 hours. ABG No results for input(s): PHART, HCO3 in the last 72 hours.  Invalid input(s): PCO2, PO2  Studies/Results: Dg Esophagus W/water Sol Cm  Result Date: 12/04/2016 CLINICAL DATA:  One day post resection of gastric GIST tumor with omentopexy. EXAM: ESOPHOGRAM/BARIUM SWALLOW TECHNIQUE: Single contrast examination was performed using water-soluble contrast. The patient was able to drink water soluble contrast via a straw without difficulty. FLUOROSCOPY TIME:  Fluoroscopy Time:  1 minutes and 36 seconds Radiation Exposure Index (if provided by the fluoroscopic device): 126 mGy COMPARISON:  None. FINDINGS: The esophagus shows normal motility and distensibility. Nasogastric tube extends into the mid stomach. Contrast enters the stomach and duodenum normally without evidence of extravasation. IMPRESSION: Unremarkable esophagram with additional evaluation of the stomach with water soluble contrast. No evidence of postoperative leak of contrast or obstruction. Electronically Signed   By: Aletta Edouard M.D.   On: 12/04/2016 11:18     Anti-infectives: Anti-infectives    Start     Dose/Rate Route Frequency Ordered Stop   12/03/16 1600  cefoTEtan (CEFOTAN) 2 g in dextrose 5 % 50 mL IVPB     2 g 100 mL/hr over 30 Minutes Intravenous Every 12 hours 12/03/16 1327 12/03/16 1747   12/03/16 0600  ceFAZolin (ANCEF) 3 g in dextrose 5 % 50 mL IVPB     3 g 130 mL/hr over 30 Minutes Intravenous On call to O.R. 12/02/16 1242 12/03/16 0750   12/03/16 0600  metroNIDAZOLE (FLAGYL) IVPB 500 mg     500 mg 100 mL/hr over 60 Minutes Intravenous On call to O.R. 12/02/16 1242 12/03/16 0757      Assessment/Plan: POD 3 lap excision of gist -soft diet -path pending -home today  [facility] 12/06/2016

## 2016-12-07 NOTE — Discharge Summary (Signed)
Physician Discharge Summary  Patient ID: Grace Singh MRN: [id number] DOB/AGE: 05-20-1966  51 y.o.  Admit date: 12/03/2016 Discharge date: 12/06/2016  Patient Care Team: [facility] as PCP - General (Internal Medicine) [name], MD as Consulting Physician (Obstetrics and Gynecology) [name], MD as Consulting Physician (Gastroenterology) [name], MD as Consulting Physician (Gastroenterology) [name], MD as Consulting Physician (General Surgery) 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 INCISIONAL UMBILICAL HERNIA REPAIR.   SURGEON:    Surgeon(s): [name], MD  Consults: None  Hospital Course:   The patient underwent the surgery above.  Postoperatively, upper GI Gastrografin which showed no leak or obstruction.  The patient gradually mobilized and advanced to a solid diet.  Pain and other symptoms were treated aggressively.    By the time of discharge, the patient was walking well the hallways, eating food, having flatus.  Pain was well-controlled on an oral medications.  Based on meeting discharge criteria and continuing to recover, I felt it was safe for the patient to be discharged from the hospital to further recover with close followup. Postoperative recommendations were discussed in detail.  They are written as  well.  Discharged Condition: good  Disposition:  Follow-up Information    Victorious Kundinger C., MD. Schedule an appointment as soon as possible for a visit in 2 week(s).   Specialty:  General Surgery Why:  To follow up after your operation, To follow up after your hospital stay Contact information: [place] [address] [phone]           01-Home or Self Care  Discharge Instructions    Call MD for:    Complete by:  As directed    Temperature > 101.27F   Call MD for:  extreme fatigue    Complete by:  As directed    Call MD for:  hives    Complete by:  As directed    Call MD for:  persistant nausea and vomiting    Complete by:  As directed    Call MD for:  redness, tenderness, or signs of infection (pain, swelling, redness, odor or green/yellow discharge around incision site)    Complete by:  As directed    Call MD for:  severe uncontrolled pain    Complete by:  As directed    Diet general    Complete by:  As directed    SEE ESOPHAGEAL SURGERY DIET INSTRUCTIONS Expect some sticking with swallowing over the next 1-2 months.   This is due to swelling around your esophagus at the wrap & hiatal diaphragm repair.  It will gradually ease off over the next few months.   To help you through this temporary phase, we start you out on a pureed (blenderized) diet.   Discharge instructions    Complete by:  As directed    Please see discharge instruction sheets.   Also refer to any handouts/printouts that may  have been given from the [facility] surgery office (if you visited [place] there before surgery) Please call our office if you have any questions or concerns [phone]   Driving Restrictions    Complete by:  As directed    No driving until off narcotics and can safely swerve away without pain during an emergency   Increase activity slowly    Complete by:  As directed    Lifting restrictions    Complete by:  As directed    Avoid heavy lifting initially, <20  pounds at first.   Do not push through pain.   You have no specific weight limit: If it hurts to do, DON'T DO IT.    If you feel no pain, you are not injuring anything.  Pain will protect you from injury.   Coughing and sneezing are far more stressful to your incision than any lifting.   Avoid resuming heavy lifting (>50 pounds) or other intense activity until off all narcotic pain medications.   When want to exercise more, give yourself 2 weeks to gradually get back to full intense exercise/activity.   May shower / Bathe    Complete by:  As directed    [place].  It is fine for dressings or wounds to be washed/rinsed.  Use gentle soap & water.  This will help the incisions and/or wounds get clean & minimize infection.   May walk up steps    Complete by:  As directed    Remove dressing in 72 hours    Complete by:  As directed    You have closed incisions: Shower and bathe over these incisions with soap and water every day.  It is OK to wash over the dressings: they are waterproof. Remove all surgical dressings on postoperative day #3.  You do not need to replace dressings over the closed incisions unless you feel more comfortable with a Band-Aid covering it.   Please call our office [phone] if you have further questions.   Sexual Activity Restrictions    Complete by:  As directed    Sexual activity as tolerated.  Do not push through pain.  Pain will protect you from injury.   Walk with assistance    Complete by:  As directed    Walk over an hour a day.  May use a walker/cane/companion to help with balance and stamina.      Allergies as of 12/06/2016   No Known Allergies     Medication List    TAKE these medications   albuterol 108 (90 Base) MCG/ACT inhaler Commonly known as:  PROVENTIL HFA;VENTOLIN HFA Inhale 2 puffs into the lungs every 6 (six) hours as needed for wheezing.   amitriptyline 100 MG tablet Commonly known as:  ELAVIL Take 100 mg by mouth at bedtime.    buPROPion 75 MG tablet Commonly known as:  WELLBUTRIN Take 75 mg by mouth 2 (two) times daily.   citalopram 40 MG tablet Commonly known as:  CELEXA Take 40 mg by mouth.   ferrous sulfate 325 (65 FE) MG tablet Take 325 mg by mouth daily.   gemfibrozil 600 MG tablet Commonly known as:  LOPID Take 600 mg by mouth 2 (two) times daily.   hydrochlorothiazide 25 MG tablet Commonly known as:  HYDRODIURIL Take 25 mg by mouth daily.   lamoTRIgine 100 MG tablet Commonly known as:  LAMICTAL Take 100 mg by mouth at bedtime.   LANTUS SOLOSTAR 100 UNIT/ML Solostar Pen Generic drug:  Insulin Glargine Inject 60 Units into the skin at bedtime.   lisinopril 20 MG tablet Commonly known as:  PRINIVIL,ZESTRIL Take 20 mg by mouth daily.   metFORMIN 1000 MG tablet Commonly known as:  GLUCOPHAGE Take 1,000 mg by mouth 2 (two) times daily.   omega-3 acid ethyl esters 1 g capsule Commonly known as:  LOVAZA Take 1 g by mouth daily.   oxyCODONE 5 MG immediate release tablet Commonly known as:  Oxy IR/ROXICODONE Take 1-2 tablets (5-10 mg total) by mouth every 4 (four) hours as needed for moderate pain.   oxyCODONE-acetaminophen 7.5-325 MG tablet Commonly known as:  PERCOCET Take 1 tablet by mouth 3 (three) times daily as needed for pain.   pioglitazone 45 MG tablet Commonly known as:  ACTOS Take 45 mg by mouth daily.   promethazine 25 MG tablet Commonly known as:  PHENERGAN Take 25 mg by mouth every 8 (eight) hours as needed for nausea/vomiting.   rosuvastatin 20 MG tablet Commonly known as:  CRESTOR Take 20 mg by mouth daily.   tiZANidine 4 MG tablet Commonly known as:  ZANAFLEX Take 4 mg by mouth every 8 (eight) hours as needed for spasms.   topiramate 50 MG tablet Commonly known as:  TOPAMAX Take 50 mg by mouth 2 (two) times daily.   VASCEPA 1 g Caps Generic drug:  Icosapent Ethyl Take 1 g by mouth 2 (two) times daily.   VICTOZA 18 MG/3ML Sopn Generic drug:   liraglutide Inject 1.8 mg into the skin at bedtime.   Vitamin D-3 5000 units Tabs Take 5,000 Units by mouth daily.       Significant Diagnostic Studies:  Results for orders placed or performed during the hospital encounter of 12/03/16 (from the past 72 hour(s))  Glucose, capillary     Status: None   Collection Time: 12/04/16 12:08 PM  Result Value Ref Range   Glucose-Capillary 81 65 - 99 mg/dL  Glucose, capillary     Status: Abnormal   Collection Time: 12/04/16  4:55 PM  Result Value Ref Range   Glucose-Capillary 127 (H) 65 - 99 mg/dL  Glucose, capillary     Status: Abnormal   Collection Time: 12/04/16 10:43 PM  Result Value Ref Range   Glucose-Capillary 155 (H) 65 - 99 mg/dL  Glucose, capillary     Status: Abnormal   Collection Time: 12/05/16  7:58 AM  Result Value Ref Range   Glucose-Capillary 203 (H) 65 - 99 mg/dL  Glucose, capillary     Status: Abnormal   Collection Time: 12/05/16 11:49 AM  Result Value Ref Range   Glucose-Capillary 143 (H) 65 - 99 mg/dL  Glucose, capillary     Status: Abnormal   Collection Time: 12/05/16  4:43 PM  Result Value Ref Range   Glucose-Capillary 134 (H) 65 - 99 mg/dL  Glucose, capillary     Status: Abnormal   Collection Time: 12/05/16 10:09 PM  Result Value Ref Range   Glucose-Capillary 123 (H) 65 - 99 mg/dL  Glucose, capillary     Status: None   Collection Time: 12/06/16  7:55 AM  Result Value Ref Range   Glucose-Capillary 95 65 - 99 mg/dL    Dg Esophagus W/water Sol Cm  Result Date: 12/04/2016 CLINICAL DATA:  One day post resection of gastric GIST tumor with omentopexy. EXAM: ESOPHOGRAM/BARIUM SWALLOW TECHNIQUE: Single contrast examination was performed using water-soluble contrast. The patient was able to drink water soluble contrast via a straw without difficulty. FLUOROSCOPY TIME:  Fluoroscopy  Time:  1 minutes and 36 seconds Radiation Exposure Index (if provided by the fluoroscopic device): 126 mGy COMPARISON:  None. FINDINGS: The  esophagus shows normal motility and distensibility. Nasogastric tube extends into the mid stomach. Contrast enters the stomach and duodenum normally without evidence of extravasation. IMPRESSION: Unremarkable esophagram with additional evaluation of the stomach with water soluble contrast. No evidence of postoperative leak of contrast or obstruction. Electronically Signed   By: Aletta Edouard M.D.   On: 12/04/2016 11:18    Discharge Exam: Blood pressure 104/63, pulse 67, temperature 97.8 F (36.6 C), temperature source Oral, resp. rate 15, height 5\' 6"  (1.676 m), weight 124.3 kg (274 lb), last menstrual period 03/07/2014, SpO2 95 %.  General: Pt awake/alert/oriented x4 in No acute distress Eyes: PERRL, normal EOM.  Sclera clear.  No icterus Neuro: CN II-XII intact w/o focal sensory/motor deficits. Lymph: No head/neck/groin lymphadenopathy Psych:  No delerium/psychosis/paranoia HENT: Normocephalic, Mucus membranes moist.  No thrush Neck: Supple, No tracheal deviation Chest: No chest wall pain w good excursion CV:  Pulses intact.  Regular rhythm MS: Normal AROM mjr joints.  No obvious deformity Abdomen: Soft.  Nondistended.  Mildly tender at incisions only.  No evidence of peritonitis.  No incarcerated hernias. Ext:  SCDs BLE.  No mjr edema.  No cyanosis Skin: No petechiae / purpura  Past Medical History:  Diagnosis Date  . AKI (acute kidney injury) ([place]) 07/02/2013  . Anxiety   . 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 Atrophic kidney    LEFT; functions at 33%  . CKD (chronic kidney disease), stage III    In record, but baseline kidney function appears to have GFR > 90; rt kidney  function 67%  . Depression   . Diabetes mellitus, type 2 ([place])   . Edema of both legs    and ankles  . Fibromyalgia   . Headache    migraines  . History of kidney stones   . History of sepsis    05-11-2013  W/ UROSEPSIS---  RESOLIVED//  03-28-2014 SEVERE SEPSIS W/ SIRS---  RESOLVED  .  Hyperlipidemia   . Hypertension   . IBS (irritable bowel syndrome)    IBSD  . Iron deficiency anemia   . Normal cardiac stress test    PER PT 2007  . PMB (postmenopausal bleeding)   . Renal calculi    bilateral  . Seasonal asthma    seasonal  . Severe sepsis ([place]) 07/02/2013  . UTI (urinary tract infection) 07/02/2013  . Wears glasses     Past Surgical History:  Procedure Laterality Date  . APPENDECTOMY  AGE 32  . COCCYX REMOVAL  2009  . CYSTOSCOPY W/ URETERAL STENT PLACEMENT Right 06/13/2013   Procedure: CYSTOSCOPY WITH STENT REPLACEMENT, right;  Surgeon: Alexis Frock, MD;  Location: [facility];  Service: Urology;  Laterality: Right;  . CYSTOSCOPY WITH RETROGRADE PYELOGRAM, URETEROSCOPY AND STENT PLACEMENT Right 06/13/2013   Procedure: First Stage Bilateral Ureteroscopy;  Surgeon: Alexis Frock, MD;  [facility];  Service: Urology;  Laterality: Right;  . CYSTOSCOPY WITH RETROGRADE PYELOGRAM, URETEROSCOPY AND STENT PLACEMENT Bilateral 06/20/2013   Procedure: CYSTOSCOPY WITH RETROGRADE PYELOGRAM, URETEROSCOPY AND STENT PLACEMENT, REMOVAL BILATERAL STENTS;  Surgeon: Alexis Frock, MD;  Location: [facility];  Service: Urology;  Laterality: Bilateral;  Bilateral stent repalcement with not string  . CYSTOSCOPY WITH STENT PLACEMENT Bilateral 05/11/2013   Procedure: CYSTOSCOPY WITH STENT PLACEMENT;  Surgeon: Alexis Frock,  MD;  Location: [facility];  Service: Urology;  Laterality: Bilateral;  . DILATATION & CURETTAGE/HYSTEROSCOPY WITH MYOSURE N/A 06/18/2015   Procedure: DILATATION & CURETTAGE/HYSTEROSCOPY WITH MYOSURE;  Surgeon: [name], MD;  Location: [place];  Service: Gynecology;  Laterality: N/A;  . EYE SURGERY     radial karatomtomy-bilaterl  . HOLMIUM LASER APPLICATION Right [id number]   Procedure: HOLMIUM LASER APPLICATION, right ureteral stone;  Surgeon: Alexis Frock, MD;  Location: [facility];  Service: Urology;  Laterality: Right;  . HOLMIUM LASER APPLICATION Right [id number]   Procedure: HOLMIUM LASER APPLICATION;  Surgeon: Alexis Frock, MD;  Location: [facility];  Service: Urology;  Laterality: Right;  . HYSTEROSCOPY W/D&C N/A 04/20/2013   Procedure: DILATATION AND CURETTAGE /HYSTEROSCOPY;  Surgeon: [name], MD;  Location: [facility];  Service: Gynecology;  Laterality: N/A;  . LAPAROSCOPIC CHOLECYSTECTOMY  AGE 39  . LAPAROSCOPIC GASTRIC RESECTION N/A 12/03/2016   Procedure: LAPAROSCOPIC RESECTION OF POSTERIOR GASTRIC GIST WITH OMENTOPEXY. INCISIONAL UMBILICAL HERNIA REPAIR. ;  Surgeon: [name], MD;  Location: [facility];  Service: General;  Laterality: N/A;  . POLYPECTOMY N/A 04/20/2013   Procedure: POLYPECTOMY;  Surgeon: [name], MD;  Location: [facility];  Service: Gynecology;  Laterality: N/A;  . SPINAL CORD STIMULATOR IMPLANT    . SPINAL CORD STIMULATOR IMPLANT  09/04/2014    Social History   Social History  . Marital status: Married    Spouse name: N/A  . Number of children: N/A  . Years of education: N/A   Occupational History  . Not on file.   Social History Main Topics  . Smoking status: Never Smoker  . Smokeless tobacco: Never Used  . Alcohol use Yes     Comment: rare  . Drug use: No  . Sexual activity: Not on file   Other Topics Concern  . Not on file   Social History Narrative  . No narrative on file    History reviewed. No pertinent family history.  No current facility-administered medications for this encounter.    Current Outpatient Prescriptions  Medication Sig Dispense Refill  . amitriptyline (ELAVIL) 100 MG tablet Take 100 mg by mouth at bedtime.    Marland Kitchen buPROPion (WELLBUTRIN) 75 MG tablet Take 75 mg by mouth 2 (two) times daily.    . Cholecalciferol (VITAMIN D-3) 5000 units TABS Take 5,000 Units by mouth daily.    . citalopram (CELEXA) 40 MG tablet Take 40 mg by mouth.     . ferrous sulfate 325 (65 FE) MG  tablet Take 325 mg by mouth daily.     Marland Kitchen gemfibrozil (LOPID) 600 MG tablet Take 600 mg by mouth 2 (two) times daily.    . hydrochlorothiazide (HYDRODIURIL) 25 MG tablet Take 25 mg by mouth daily.    Marland Kitchen lamoTRIgine (LAMICTAL) 100 MG tablet Take 100 mg by mouth at bedtime.     Marland Kitchen LANTUS SOLOSTAR 100 UNIT/ML Solostar Pen Inject 60 Units into the skin at bedtime.    Marland Kitchen lisinopril (PRINIVIL,ZESTRIL) 20 MG tablet Take 20 mg by mouth daily.    . metFORMIN (GLUCOPHAGE) 1000 MG tablet Take 1,000 mg by mouth 2 (two) times daily.    Marland Kitchen omega-3 acid ethyl esters (LOVAZA) 1 G capsule Take 1 g by mouth daily.     Marland Kitchen oxyCODONE-acetaminophen (PERCOCET) 7.5-325 MG tablet Take 1 tablet by mouth 3 (three) times daily as needed for pain.    . pioglitazone (ACTOS) 45 MG tablet Take 45 mg by  mouth daily.    . promethazine (PHENERGAN) 25 MG tablet Take 25 mg by mouth every 8 (eight) hours as needed for nausea/vomiting.    . rosuvastatin (CRESTOR) 20 MG tablet Take 20 mg by mouth daily.    Marland Kitchen tiZANidine (ZANAFLEX) 4 MG tablet Take 4 mg by mouth every 8 (eight) hours as needed for spasms.    Marland Kitchen topiramate (TOPAMAX) 50 MG tablet Take 50 mg by mouth 2 (two) times daily.    Marland Kitchen VASCEPA 1 g CAPS Take 1 g by mouth 2 (two) times daily.    Marland Kitchen VICTOZA 18 MG/3ML SOPN Inject 1.8 mg into the skin at bedtime.    Marland Kitchen albuterol (PROVENTIL HFA;VENTOLIN HFA) 108 (90 BASE) MCG/ACT inhaler Inhale 2 puffs into the lungs every 6 (six) hours as needed for wheezing.    Marland Kitchen oxyCODONE (OXY IR/ROXICODONE) 5 MG immediate release tablet Take 1-2 tablets (5-10 mg total) by mouth every 4 (four) hours as needed for moderate pain. 20 tablet 0     No Known Allergies  Signed: Morton Peters, M.D., F.A.C.S. Gastrointestinal and Minimally Invasive Surgery [facility] [address] [place], [place] [address] [phone] Main / Paging   12/07/2016, 10:30 AM

## 2016-12-15 ENCOUNTER — Telehealth: Payer: Self-pay | Admitting: *Deleted

## 2016-12-15 NOTE — Telephone Encounter (Signed)
Called patient to make her aware that per tumor board discussion today, she does not need medical oncology referral. No oncology intervention or follow up is necessary. Her consult for 12/17/16 will be cancelled. She understands and agrees.

## 2016-12-17 ENCOUNTER — Ambulatory Visit: Payer: Commercial Managed Care - HMO | Admitting: Hematology

## 2016-12-27 DIAGNOSIS — Z6841 Body Mass Index (BMI) 40.0 and over, adult: Secondary | ICD-10-CM | POA: Diagnosis not present

## 2016-12-27 DIAGNOSIS — I10 Essential (primary) hypertension: Secondary | ICD-10-CM | POA: Diagnosis not present

## 2016-12-27 DIAGNOSIS — R946 Abnormal results of thyroid function studies: Secondary | ICD-10-CM | POA: Diagnosis not present

## 2016-12-27 DIAGNOSIS — Z1389 Encounter for screening for other disorder: Secondary | ICD-10-CM | POA: Diagnosis not present

## 2017-01-12 DIAGNOSIS — I10 Essential (primary) hypertension: Secondary | ICD-10-CM | POA: Diagnosis not present

## 2017-01-12 DIAGNOSIS — E785 Hyperlipidemia, unspecified: Secondary | ICD-10-CM | POA: Diagnosis not present

## 2017-01-12 DIAGNOSIS — Z6841 Body Mass Index (BMI) 40.0 and over, adult: Secondary | ICD-10-CM | POA: Diagnosis not present

## 2017-01-12 DIAGNOSIS — E088 Diabetes mellitus due to underlying condition with unspecified complications: Secondary | ICD-10-CM | POA: Diagnosis not present

## 2017-02-01 DIAGNOSIS — K219 Gastro-esophageal reflux disease without esophagitis: Secondary | ICD-10-CM | POA: Diagnosis not present

## 2017-02-01 DIAGNOSIS — R100 Acute abdomen: Secondary | ICD-10-CM | POA: Diagnosis not present

## 2017-02-07 DIAGNOSIS — R69 Illness, unspecified: Secondary | ICD-10-CM | POA: Diagnosis not present

## 2017-02-18 DIAGNOSIS — G894 Chronic pain syndrome: Secondary | ICD-10-CM | POA: Diagnosis not present

## 2017-02-18 DIAGNOSIS — M533 Sacrococcygeal disorders, not elsewhere classified: Secondary | ICD-10-CM | POA: Diagnosis not present

## 2017-02-18 DIAGNOSIS — Z9689 Presence of other specified functional implants: Secondary | ICD-10-CM | POA: Diagnosis not present

## 2017-03-24 DIAGNOSIS — R69 Illness, unspecified: Secondary | ICD-10-CM | POA: Diagnosis not present

## 2017-03-29 DIAGNOSIS — D649 Anemia, unspecified: Secondary | ICD-10-CM | POA: Diagnosis not present

## 2017-03-29 DIAGNOSIS — J309 Allergic rhinitis, unspecified: Secondary | ICD-10-CM | POA: Diagnosis not present

## 2017-03-29 DIAGNOSIS — R600 Localized edema: Secondary | ICD-10-CM | POA: Diagnosis not present

## 2017-03-29 DIAGNOSIS — E119 Type 2 diabetes mellitus without complications: Secondary | ICD-10-CM | POA: Diagnosis not present

## 2017-03-29 DIAGNOSIS — E785 Hyperlipidemia, unspecified: Secondary | ICD-10-CM | POA: Diagnosis not present

## 2017-03-29 DIAGNOSIS — I10 Essential (primary) hypertension: Secondary | ICD-10-CM | POA: Diagnosis not present

## 2017-03-29 DIAGNOSIS — Z79899 Other long term (current) drug therapy: Secondary | ICD-10-CM | POA: Diagnosis not present

## 2017-03-29 DIAGNOSIS — Z6841 Body Mass Index (BMI) 40.0 and over, adult: Secondary | ICD-10-CM | POA: Diagnosis not present

## 2017-03-29 DIAGNOSIS — E559 Vitamin D deficiency, unspecified: Secondary | ICD-10-CM | POA: Diagnosis not present

## 2017-03-29 DIAGNOSIS — E1140 Type 2 diabetes mellitus with diabetic neuropathy, unspecified: Secondary | ICD-10-CM | POA: Diagnosis not present

## 2017-03-29 DIAGNOSIS — J45909 Unspecified asthma, uncomplicated: Secondary | ICD-10-CM | POA: Diagnosis not present

## 2017-05-13 DIAGNOSIS — R1031 Right lower quadrant pain: Secondary | ICD-10-CM | POA: Diagnosis not present

## 2017-05-13 DIAGNOSIS — R1032 Left lower quadrant pain: Secondary | ICD-10-CM | POA: Diagnosis not present

## 2017-05-13 DIAGNOSIS — Z5181 Encounter for therapeutic drug level monitoring: Secondary | ICD-10-CM | POA: Diagnosis not present

## 2017-05-13 DIAGNOSIS — M25551 Pain in right hip: Secondary | ICD-10-CM | POA: Diagnosis not present

## 2017-05-13 DIAGNOSIS — Z79899 Other long term (current) drug therapy: Secondary | ICD-10-CM | POA: Diagnosis not present

## 2017-05-13 DIAGNOSIS — M25552 Pain in left hip: Secondary | ICD-10-CM | POA: Diagnosis not present

## 2017-05-13 DIAGNOSIS — R102 Pelvic and perineal pain: Secondary | ICD-10-CM | POA: Diagnosis not present

## 2017-05-13 DIAGNOSIS — G894 Chronic pain syndrome: Secondary | ICD-10-CM | POA: Diagnosis not present

## 2017-05-13 DIAGNOSIS — Z9689 Presence of other specified functional implants: Secondary | ICD-10-CM | POA: Diagnosis not present

## 2017-05-13 DIAGNOSIS — M533 Sacrococcygeal disorders, not elsewhere classified: Secondary | ICD-10-CM | POA: Diagnosis not present

## 2017-05-16 DIAGNOSIS — Z794 Long term (current) use of insulin: Secondary | ICD-10-CM | POA: Diagnosis not present

## 2017-05-16 DIAGNOSIS — R809 Proteinuria, unspecified: Secondary | ICD-10-CM | POA: Diagnosis not present

## 2017-05-16 DIAGNOSIS — E1165 Type 2 diabetes mellitus with hyperglycemia: Secondary | ICD-10-CM | POA: Diagnosis not present

## 2017-05-16 DIAGNOSIS — E1129 Type 2 diabetes mellitus with other diabetic kidney complication: Secondary | ICD-10-CM | POA: Diagnosis not present

## 2017-05-16 DIAGNOSIS — E781 Pure hyperglyceridemia: Secondary | ICD-10-CM | POA: Diagnosis not present

## 2017-06-13 DIAGNOSIS — G894 Chronic pain syndrome: Secondary | ICD-10-CM | POA: Diagnosis not present

## 2017-06-13 DIAGNOSIS — M533 Sacrococcygeal disorders, not elsewhere classified: Secondary | ICD-10-CM | POA: Diagnosis not present

## 2017-06-13 DIAGNOSIS — Z5181 Encounter for therapeutic drug level monitoring: Secondary | ICD-10-CM | POA: Diagnosis not present

## 2017-06-13 DIAGNOSIS — Z9689 Presence of other specified functional implants: Secondary | ICD-10-CM | POA: Diagnosis not present

## 2017-06-13 DIAGNOSIS — M25552 Pain in left hip: Secondary | ICD-10-CM | POA: Diagnosis not present

## 2017-06-13 DIAGNOSIS — M25551 Pain in right hip: Secondary | ICD-10-CM | POA: Diagnosis not present

## 2017-06-13 DIAGNOSIS — Z79899 Other long term (current) drug therapy: Secondary | ICD-10-CM | POA: Diagnosis not present

## 2017-06-13 DIAGNOSIS — R69 Illness, unspecified: Secondary | ICD-10-CM | POA: Diagnosis not present

## 2017-06-27 DIAGNOSIS — M545 Low back pain: Secondary | ICD-10-CM | POA: Diagnosis not present

## 2017-06-27 DIAGNOSIS — Z9689 Presence of other specified functional implants: Secondary | ICD-10-CM | POA: Diagnosis not present

## 2017-06-27 DIAGNOSIS — M25551 Pain in right hip: Secondary | ICD-10-CM | POA: Diagnosis not present

## 2017-06-27 DIAGNOSIS — N3001 Acute cystitis with hematuria: Secondary | ICD-10-CM | POA: Diagnosis not present

## 2017-06-27 DIAGNOSIS — M533 Sacrococcygeal disorders, not elsewhere classified: Secondary | ICD-10-CM | POA: Diagnosis not present

## 2017-06-27 DIAGNOSIS — Z6841 Body Mass Index (BMI) 40.0 and over, adult: Secondary | ICD-10-CM | POA: Diagnosis not present

## 2017-06-27 DIAGNOSIS — G8929 Other chronic pain: Secondary | ICD-10-CM | POA: Diagnosis not present

## 2017-06-27 DIAGNOSIS — R110 Nausea: Secondary | ICD-10-CM | POA: Diagnosis not present

## 2017-07-15 DIAGNOSIS — N3020 Other chronic cystitis without hematuria: Secondary | ICD-10-CM | POA: Diagnosis not present

## 2017-07-15 DIAGNOSIS — N200 Calculus of kidney: Secondary | ICD-10-CM | POA: Diagnosis not present

## 2017-07-20 DIAGNOSIS — H1713 Central corneal opacity, bilateral: Secondary | ICD-10-CM | POA: Diagnosis not present

## 2017-07-20 DIAGNOSIS — E119 Type 2 diabetes mellitus without complications: Secondary | ICD-10-CM | POA: Diagnosis not present

## 2017-07-20 DIAGNOSIS — H40033 Anatomical narrow angle, bilateral: Secondary | ICD-10-CM | POA: Diagnosis not present

## 2017-08-03 ENCOUNTER — Ambulatory Visit (INDEPENDENT_AMBULATORY_CARE_PROVIDER_SITE_OTHER): Payer: Medicare HMO | Admitting: Cardiology

## 2017-08-03 ENCOUNTER — Encounter: Payer: Self-pay | Admitting: Cardiology

## 2017-08-03 VITALS — BP 114/72 | HR 92 | Ht 66.0 in | Wt 280.1 lb

## 2017-08-03 DIAGNOSIS — E1165 Type 2 diabetes mellitus with hyperglycemia: Secondary | ICD-10-CM

## 2017-08-03 DIAGNOSIS — IMO0002 Reserved for concepts with insufficient information to code with codable children: Secondary | ICD-10-CM

## 2017-08-03 DIAGNOSIS — R809 Proteinuria, unspecified: Secondary | ICD-10-CM | POA: Diagnosis not present

## 2017-08-03 DIAGNOSIS — I10 Essential (primary) hypertension: Secondary | ICD-10-CM | POA: Diagnosis not present

## 2017-08-03 DIAGNOSIS — Z794 Long term (current) use of insulin: Secondary | ICD-10-CM | POA: Diagnosis not present

## 2017-08-03 DIAGNOSIS — E781 Pure hyperglyceridemia: Secondary | ICD-10-CM

## 2017-08-03 DIAGNOSIS — E1129 Type 2 diabetes mellitus with other diabetic kidney complication: Secondary | ICD-10-CM

## 2017-08-03 NOTE — [place] Notes (Signed)
Cardiology Office Note:    Date:  08/03/2017   ID:  Grace Singh, DOB [birth year], MRN [id number]  PCP:  Rubie Maid, MD  Cardiologist:  Jenean Lindau, MD   Referring MD: Rubie Maid, MD    ASSESSMENT:    1. Essential hypertension   2. Uncontrolled type 2 diabetes mellitus with microalbuminuria, with long-term current use of insulin ([place])   3. Hypertriglyceridemia   4. Severe obesity (BMI >= 40) (HCC)    PLAN:    In order of problems listed above:  1. I discussed my findings with the patient at extensive length. Her blood pressure stable and her diabetes is followed by her primary care physician. 2. Diet was discussed with dyslipidemia obesity and diabetes mellitus. These issues were discussed with her at length and the risks of obesity explained and she vocalized understanding she plans to diet to do better. 3. She mentions to me that she is cooperative lipid check in the next few weeks by her primary care physician and this will be forwarded to me. I reviewed the act accordingly. She promises to diet and lose weight. 4. Patient will be seen in follow-up appointment in 6 months or earlier if the patient has any concerns. 5.    Medication Adjustments/Labs and Tests Ordered: Current medicines are reviewed at length with the patient today.  Concerns regarding medicines are outlined above.  No orders of the defined types were placed in this encounter.  No orders of the defined types were placed in this encounter.    History of Present Illness:    Grace Singh is a 51 y.o. female who is being seen today for the evaluation of Essential hypertension and dyslipidemia at the request of Rubie Maid, MD. Patient has been under my care in the previous practice and now wants to be established here. She has history of essential hypertension, diabetes mellitus and dyslipidemia and morbid obesity. She has orthopedic issues because the patient cannot cystitis on a  regular basis. She tries to do water aerobics. No chest pain orthopnea or PND. She is here to be established. At the time of she is alert awake oriented and in no distress.    Past Medical History:  Diagnosis Date  . AKI (acute kidney injury) ([place]) 07/02/2013  . Anxiety   . 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 Atrophic kidney    LEFT; functions at 33%  . CKD (chronic kidney disease), stage III    In record, but baseline kidney function appears to have GFR > 90; rt kidney  function 67%  . Depression   . Diabetes mellitus, type 2 ([place])   . Edema of both legs    and ankles  . Fibromyalgia   . Headache    migraines  . History of kidney stones   . History of sepsis    05-11-2013  W/ UROSEPSIS---  RESOLIVED//  03-28-2014 SEVERE SEPSIS W/ SIRS---  RESOLVED  . Hyperlipidemia   . Hypertension   . IBS (irritable bowel syndrome)    IBSD  . Iron deficiency anemia   . Normal cardiac stress test    PER PT 2007  . PMB (postmenopausal bleeding)   . Renal calculi    bilateral  . Seasonal asthma    seasonal  . Severe sepsis ([place]) 07/02/2013  . UTI (urinary tract infection) 07/02/2013  . Wears glasses     Past Surgical History:  Procedure Laterality Date  . APPENDECTOMY  AGE 31  . COCCYX REMOVAL  2009  . CYSTOSCOPY W/ URETERAL STENT PLACEMENT Right 06/13/2013   Procedure: CYSTOSCOPY WITH STENT REPLACEMENT, right;  Surgeon: Alexis Frock, MD;  Location: [facility];  Service: Urology;  Laterality: Right;  . CYSTOSCOPY WITH RETROGRADE PYELOGRAM, URETEROSCOPY AND STENT PLACEMENT Right 06/13/2013   Procedure: First Stage Bilateral Ureteroscopy;  Surgeon: Alexis Frock, MD;  Location: [facility];  Service: Urology;  Laterality: Right;  . CYSTOSCOPY WITH RETROGRADE PYELOGRAM, URETEROSCOPY AND STENT PLACEMENT Bilateral 06/20/2013   Procedure: CYSTOSCOPY WITH RETROGRADE PYELOGRAM, URETEROSCOPY AND STENT PLACEMENT, REMOVAL BILATERAL STENTS;  Surgeon: Alexis Frock, MD;  Location[facility];  Service: Urology;  Laterality: Bilateral;  Bilateral stent repalcement with not string  . CYSTOSCOPY WITH STENT PLACEMENT Bilateral 05/11/2013   Procedure: CYSTOSCOPY WITH STENT PLACEMENT;  Surgeon: Alexis Frock, MD;  Location: [facility];  Service: Urology;  Laterality: Bilateral;  . DILATATION & CURETTAGE/HYSTEROSCOPY WITH MYOSURE N/A 06/18/2015   Procedure: DILATATION & CURETTAGE/HYSTEROSCOPY WITH MYOSURE;  Surgeon: [name], MD;  Location: [place];  Service: Gynecology;  Laterality: N/A;  . EYE SURGERY     radial karatomtomy-bilaterl  . HOLMIUM LASER APPLICATION Right 4/0/0867   Procedure: HOLMIUM LASER APPLICATION, right ureteral stone;  Surgeon: Alexis Frock, MD;  Location: [facility];  Service: Urology;  Laterality: Right;  . HOLMIUM LASER APPLICATION Right 05/24/5092   Procedure: HOLMIUM LASER APPLICATION;  Surgeon: Alexis Frock, MD;  Location[facility];  Service: Urology;  Laterality: Right;  . HYSTEROSCOPY W/D&C N/A 04/20/2013   Procedure: DILATATION AND CURETTAGE /HYSTEROSCOPY;  Surgeon: [name], MD;  Location: [facility];  Service: Gynecology;  Laterality: N/A;  . LAPAROSCOPIC CHOLECYSTECTOMY  AGE 89  . LAPAROSCOPIC GASTRIC RESECTION N/A 12/03/2016   Procedure: LAPAROSCOPIC RESECTION OF POSTERIOR GASTRIC GIST WITH OMENTOPEXY. INCISIONAL UMBILICAL HERNIA REPAIR. ;  Surgeon: [name], MD;  Location: [facility];  Service: General;  Laterality: N/A;  . POLYPECTOMY N/A 04/20/2013   Procedure: POLYPECTOMY;  Surgeon: [name], MD;  Location: [facility];  Service: Gynecology;  Laterality: N/A;  . SPINAL CORD STIMULATOR IMPLANT    . SPINAL CORD STIMULATOR IMPLANT  09/04/2014    Current Medications: Current Meds  Medication Sig  . albuterol (PROVENTIL HFA;VENTOLIN HFA) 108 (90 BASE) MCG/ACT inhaler Inhale 2 puffs into the lungs every 6 (six) hours as needed for wheezing.  Marland Kitchen  amitriptyline (ELAVIL) 100 MG tablet Take 100 mg by mouth at bedtime.  Marland Kitchen buPROPion (WELLBUTRIN) 75 MG tablet Take 75 mg by mouth 2 (two) times daily.  . Cholecalciferol (VITAMIN D-3) 5000 units TABS Take 5,000 Units by mouth daily.  . citalopram (CELEXA) 40 MG tablet Take 40 mg by mouth.   . ferrous sulfate 325 (65 FE) MG tablet Take 325 mg by mouth daily.   Marland Kitchen gemfibrozil (LOPID) 600 MG tablet Take 600 mg by mouth 2 (two) times daily.  . hydrochlorothiazide (HYDRODIURIL) 25 MG tablet Take 25 mg by mouth daily.  Marland Kitchen lamoTRIgine (LAMICTAL) 100 MG tablet Take 100 mg by mouth at bedtime.   Marland Kitchen LANTUS SOLOSTAR 100 UNIT/ML Solostar Pen Inject 60 Units into the skin at bedtime.  Marland Kitchen lisinopril (PRINIVIL,ZESTRIL) 20 MG tablet Take 20 mg by mouth daily.  . metFORMIN (GLUCOPHAGE) 1000 MG tablet Take 1,000 mg by mouth 2 (two) times daily.  Marland Kitchen omega-3 acid ethyl esters (LOVAZA) 1 G capsule Take 1 g by mouth daily.   Marland Kitchen oxyCODONE-acetaminophen (PERCOCET) 7.5-325 MG  tablet Take 1 tablet by mouth 3 (three) times daily as needed for pain.  . pioglitazone (ACTOS) 45 MG tablet Take 45 mg by mouth daily.  . promethazine (PHENERGAN) 25 MG tablet Take 25 mg by mouth every 8 (eight) hours as needed for nausea/vomiting.  . rosuvastatin (CRESTOR) 20 MG tablet Take 20 mg by mouth daily.  Marland Kitchen tiZANidine (ZANAFLEX) 4 MG tablet Take 4 mg by mouth every 8 (eight) hours as needed for spasms.  Marland Kitchen topiramate (TOPAMAX) 50 MG tablet Take 50 mg by mouth 2 (two) times daily.  Marland Kitchen VASCEPA 1 g CAPS Take 1 g by mouth 2 (two) times daily.  Marland Kitchen VICTOZA 18 MG/3ML SOPN Inject 1.8 mg into the skin at bedtime.     Allergies:   Patient has no known allergies.   Social History   Social History  . Marital status: Married    Spouse name: N/A  . Number of children: N/A  . Years of education: N/A   Social History Main Topics  . Smoking status: Never Smoker  . Smokeless tobacco: Never Used  . Alcohol use Yes     Comment: rare  . Drug use: No  .  Sexual activity: Not Asked   Other Topics Concern  . None   Social History Narrative  . None     Family History: The patient's family history includes Heart disease in her father.  ROS:   Please see the history of present illness.    All other systems reviewed and are negative.  EKGs/Labs/Other Studies Reviewed:    The following studies were reviewed today: I reviewed records from previous notes and discussed these with the patient at length. I do not have records from recent lipid studies  Recent Labs: 11/24/2016: BUN 22; Creatinine, Ser 0.83; Hemoglobin 13.2; Platelets 380; Potassium 4.9; Sodium 140  Recent Lipid Panel    Component Value Date/Time   CHOL 262 (H) 03/29/2014 0230   TRIG 1,631 (H) 03/29/2014 0230   HDL NOT REPORTED DUE TO HIGH TRIGLYCERIDES 03/29/2014 0230   CHOLHDL NOT REPORTED DUE TO HIGH TRIGLYCERIDES 03/29/2014 0230   VLDL UNABLE TO CALCULATE IF TRIGLYCERIDE OVER 400 mg/dL 03/29/2014 0230   LDLCALC UNABLE TO CALCULATE IF TRIGLYCERIDE OVER 400 mg/dL 03/29/2014 0230    Physical Exam:    VS:  BP 114/72   Pulse 92   Ht 5\' 6"  (1.676 m)   Wt 280 lb 1.9 oz (127.1 kg)   LMP 03/07/2014   SpO2 98%   BMI 45.21 kg/m     Wt Readings from Last 3 Encounters:  08/03/17 280 lb 1.9 oz (127.1 kg)  12/03/16 274 lb (124.3 kg)  11/24/16 274 lb (124.3 kg)     GEN: Patient is in no acute distress HEENT: Normal NECK: No JVD; No carotid bruits LYMPHATICS: No lymphadenopathy CARDIAC: S1 S2 regular, 2/6 systolic murmur at the apex. RESPIRATORY:  Clear to auscultation without rales, wheezing or rhonchi  ABDOMEN: Soft, non-tender, non-distended MUSCULOSKELETAL:  No edema; No deformity  SKIN: Warm and dry NEUROLOGIC:  Alert and oriented x 3 PSYCHIATRIC:  Normal affect    Signed, Jenean Lindau, MD  08/03/2017 1:51 PM    [facility]

## 2017-08-03 NOTE — Patient Instructions (Signed)
Medication Instructions:  Your physician recommends that you continue on your current medications as directed. Please refer to the Current Medication list given to you today.   Labwork:  NONE  Testing/Procedures:  NONE  Follow-Up:  Your physician has recommended you make the following change in your medication: 6 months with Dr. Geraldo Pitter    Any Other Special Instructions Will Be Listed Below (If Applicable).     If you need a refill on your cardiac medications before your next appointment, please call your pharmacy.

## 2017-08-17 DIAGNOSIS — E781 Pure hyperglyceridemia: Secondary | ICD-10-CM | POA: Diagnosis not present

## 2017-08-17 DIAGNOSIS — G894 Chronic pain syndrome: Secondary | ICD-10-CM | POA: Diagnosis not present

## 2017-08-22 DIAGNOSIS — Z13828 Encounter for screening for other musculoskeletal disorder: Secondary | ICD-10-CM | POA: Insufficient documentation

## 2017-08-22 DIAGNOSIS — G894 Chronic pain syndrome: Secondary | ICD-10-CM | POA: Diagnosis not present

## 2017-08-22 DIAGNOSIS — E1129 Type 2 diabetes mellitus with other diabetic kidney complication: Secondary | ICD-10-CM | POA: Diagnosis not present

## 2017-08-22 DIAGNOSIS — Z794 Long term (current) use of insulin: Secondary | ICD-10-CM | POA: Diagnosis not present

## 2017-08-22 DIAGNOSIS — R809 Proteinuria, unspecified: Secondary | ICD-10-CM | POA: Diagnosis not present

## 2017-08-22 DIAGNOSIS — M5417 Radiculopathy, lumbosacral region: Secondary | ICD-10-CM | POA: Diagnosis not present

## 2017-08-29 DIAGNOSIS — G894 Chronic pain syndrome: Secondary | ICD-10-CM | POA: Diagnosis not present

## 2017-08-29 DIAGNOSIS — M545 Low back pain: Secondary | ICD-10-CM | POA: Diagnosis not present

## 2017-08-29 DIAGNOSIS — M25551 Pain in right hip: Secondary | ICD-10-CM | POA: Diagnosis not present

## 2017-08-29 DIAGNOSIS — Z9689 Presence of other specified functional implants: Secondary | ICD-10-CM | POA: Diagnosis not present

## 2017-08-29 DIAGNOSIS — G8929 Other chronic pain: Secondary | ICD-10-CM | POA: Diagnosis not present

## 2017-09-12 DIAGNOSIS — L821 Other seborrheic keratosis: Secondary | ICD-10-CM | POA: Diagnosis not present

## 2017-09-12 DIAGNOSIS — D1801 Hemangioma of skin and subcutaneous tissue: Secondary | ICD-10-CM | POA: Diagnosis not present

## 2017-09-12 DIAGNOSIS — D485 Neoplasm of uncertain behavior of skin: Secondary | ICD-10-CM | POA: Diagnosis not present

## 2017-09-26 DIAGNOSIS — N3020 Other chronic cystitis without hematuria: Secondary | ICD-10-CM | POA: Diagnosis not present

## 2017-09-26 DIAGNOSIS — N200 Calculus of kidney: Secondary | ICD-10-CM | POA: Diagnosis not present

## 2017-09-26 DIAGNOSIS — N261 Atrophy of kidney (terminal): Secondary | ICD-10-CM | POA: Diagnosis not present

## 2017-10-04 DIAGNOSIS — R69 Illness, unspecified: Secondary | ICD-10-CM | POA: Diagnosis not present

## 2017-10-10 DIAGNOSIS — R69 Illness, unspecified: Secondary | ICD-10-CM | POA: Diagnosis not present

## 2017-10-19 DIAGNOSIS — R69 Illness, unspecified: Secondary | ICD-10-CM | POA: Diagnosis not present

## 2017-10-24 DIAGNOSIS — M533 Sacrococcygeal disorders, not elsewhere classified: Secondary | ICD-10-CM | POA: Diagnosis not present

## 2017-10-24 DIAGNOSIS — Z79899 Other long term (current) drug therapy: Secondary | ICD-10-CM | POA: Diagnosis not present

## 2017-10-24 DIAGNOSIS — M25551 Pain in right hip: Secondary | ICD-10-CM | POA: Diagnosis not present

## 2017-10-24 DIAGNOSIS — Z9689 Presence of other specified functional implants: Secondary | ICD-10-CM | POA: Diagnosis not present

## 2017-10-24 DIAGNOSIS — M545 Low back pain: Secondary | ICD-10-CM | POA: Diagnosis not present

## 2017-10-24 DIAGNOSIS — G8929 Other chronic pain: Secondary | ICD-10-CM | POA: Diagnosis not present

## 2017-10-24 DIAGNOSIS — Z5181 Encounter for therapeutic drug level monitoring: Secondary | ICD-10-CM | POA: Diagnosis not present

## 2017-11-21 DIAGNOSIS — E782 Mixed hyperlipidemia: Secondary | ICD-10-CM | POA: Diagnosis not present

## 2017-11-21 DIAGNOSIS — R809 Proteinuria, unspecified: Secondary | ICD-10-CM | POA: Diagnosis not present

## 2017-11-21 DIAGNOSIS — E1165 Type 2 diabetes mellitus with hyperglycemia: Secondary | ICD-10-CM | POA: Diagnosis not present

## 2017-11-21 DIAGNOSIS — E1129 Type 2 diabetes mellitus with other diabetic kidney complication: Secondary | ICD-10-CM | POA: Diagnosis not present

## 2017-11-21 DIAGNOSIS — Z794 Long term (current) use of insulin: Secondary | ICD-10-CM | POA: Diagnosis not present

## 2017-11-27 DIAGNOSIS — N390 Urinary tract infection, site not specified: Secondary | ICD-10-CM | POA: Diagnosis not present

## 2017-11-27 DIAGNOSIS — M545 Low back pain: Secondary | ICD-10-CM | POA: Diagnosis not present

## 2017-12-01 DIAGNOSIS — N200 Calculus of kidney: Secondary | ICD-10-CM | POA: Diagnosis not present

## 2017-12-01 DIAGNOSIS — N3020 Other chronic cystitis without hematuria: Secondary | ICD-10-CM | POA: Diagnosis not present

## 2017-12-01 DIAGNOSIS — R300 Dysuria: Secondary | ICD-10-CM | POA: Diagnosis not present

## 2017-12-28 DIAGNOSIS — Z9889 Other specified postprocedural states: Secondary | ICD-10-CM | POA: Diagnosis not present

## 2017-12-28 DIAGNOSIS — Z5181 Encounter for therapeutic drug level monitoring: Secondary | ICD-10-CM | POA: Diagnosis not present

## 2017-12-28 DIAGNOSIS — M533 Sacrococcygeal disorders, not elsewhere classified: Secondary | ICD-10-CM | POA: Diagnosis not present

## 2017-12-28 DIAGNOSIS — M5417 Radiculopathy, lumbosacral region: Secondary | ICD-10-CM | POA: Diagnosis not present

## 2017-12-28 DIAGNOSIS — Z79899 Other long term (current) drug therapy: Secondary | ICD-10-CM | POA: Diagnosis not present

## 2017-12-28 DIAGNOSIS — G894 Chronic pain syndrome: Secondary | ICD-10-CM | POA: Diagnosis not present

## 2018-02-23 DIAGNOSIS — M5417 Radiculopathy, lumbosacral region: Secondary | ICD-10-CM | POA: Diagnosis not present

## 2018-02-23 DIAGNOSIS — G894 Chronic pain syndrome: Secondary | ICD-10-CM | POA: Diagnosis not present

## 2018-02-23 DIAGNOSIS — M533 Sacrococcygeal disorders, not elsewhere classified: Secondary | ICD-10-CM | POA: Diagnosis not present

## 2018-02-23 DIAGNOSIS — Z9889 Other specified postprocedural states: Secondary | ICD-10-CM | POA: Diagnosis not present

## 2018-02-24 ENCOUNTER — Ambulatory Visit: Payer: Medicare HMO | Admitting: Cardiology

## 2018-04-27 DIAGNOSIS — G894 Chronic pain syndrome: Secondary | ICD-10-CM | POA: Diagnosis not present

## 2018-04-27 DIAGNOSIS — M5417 Radiculopathy, lumbosacral region: Secondary | ICD-10-CM | POA: Diagnosis not present

## 2018-04-27 DIAGNOSIS — M545 Low back pain: Secondary | ICD-10-CM | POA: Diagnosis not present

## 2018-04-27 DIAGNOSIS — M533 Sacrococcygeal disorders, not elsewhere classified: Secondary | ICD-10-CM | POA: Diagnosis not present

## 2018-05-09 ENCOUNTER — Other Ambulatory Visit: Payer: Self-pay

## 2018-05-09 ENCOUNTER — Telehealth: Payer: Self-pay | Admitting: *Deleted

## 2018-05-09 MED ORDER — GEMFIBROZIL 600 MG PO TABS: 600.0000 mg | ORAL_TABLET | Freq: Two times a day (BID) | ORAL | 0 refills | Status: DC

## 2018-05-09 NOTE — Telephone Encounter (Signed)
Pt requesting 90 day supply of Lopid 600 mg from [facility]

## 2018-05-09 NOTE — Telephone Encounter (Signed)
Follow up appointment has been made. Refill for lopid has been sent.

## 2018-05-12 ENCOUNTER — Telehealth: Payer: Self-pay | Admitting: Cardiology

## 2018-05-12 ENCOUNTER — Ambulatory Visit (INDEPENDENT_AMBULATORY_CARE_PROVIDER_SITE_OTHER): Payer: PPO | Admitting: Cardiology

## 2018-05-12 ENCOUNTER — Encounter: Payer: Self-pay | Admitting: Cardiology

## 2018-05-12 VITALS — BP 118/72 | HR 85 | Ht 66.0 in | Wt 279.0 lb

## 2018-05-12 DIAGNOSIS — E1129 Type 2 diabetes mellitus with other diabetic kidney complication: Secondary | ICD-10-CM | POA: Diagnosis not present

## 2018-05-12 DIAGNOSIS — E785 Hyperlipidemia, unspecified: Secondary | ICD-10-CM

## 2018-05-12 DIAGNOSIS — E1165 Type 2 diabetes mellitus with hyperglycemia: Secondary | ICD-10-CM

## 2018-05-12 DIAGNOSIS — R809 Proteinuria, unspecified: Secondary | ICD-10-CM

## 2018-05-12 DIAGNOSIS — E781 Pure hyperglyceridemia: Secondary | ICD-10-CM

## 2018-05-12 DIAGNOSIS — I10 Essential (primary) hypertension: Secondary | ICD-10-CM

## 2018-05-12 DIAGNOSIS — IMO0002 Reserved for concepts with insufficient information to code with codable children: Secondary | ICD-10-CM

## 2018-05-12 DIAGNOSIS — Z794 Long term (current) use of insulin: Secondary | ICD-10-CM | POA: Diagnosis not present

## 2018-05-12 MED ORDER — GEMFIBROZIL 600 MG PO TABS: 600.0000 mg | ORAL_TABLET | Freq: Two times a day (BID) | ORAL | 3 refills | Status: DC

## 2018-05-12 MED ORDER — VASCEPA 1 G PO CAPS: 1.0000 g | ORAL_CAPSULE | Freq: Two times a day (BID) | ORAL | 3 refills | Status: DC

## 2018-05-12 MED ORDER — ROSUVASTATIN CALCIUM 20 MG PO TABS: 20.0000 mg | ORAL_TABLET | Freq: Every day | ORAL | 3 refills | Status: DC

## 2018-05-12 NOTE — Telephone Encounter (Signed)
Please refill cholesterol medicines to [facility] in [place]. Patient did not know names

## 2018-05-12 NOTE — Telephone Encounter (Signed)
Refills sent

## 2018-05-12 NOTE — Patient Instructions (Signed)
Medication Instructions:  Your physician recommends that you continue on your current medications as directed. Please refer to the Current Medication list given to you today.  Labwork: Your physician recommends that you have the following labs drawn: BMP, CBC, LFT, lipid  Testing/Procedures: None  Follow-Up: Your physician wants you to follow-up in: 6 months. You will receive a reminder letter in the mail two months in advance. If you don't receive a letter, please call our office to schedule the follow-up appointment.  Any Other Special Instructions Will Be Listed Below (If Applicable).     If you need a refill on your cardiac medications before your next appointment, please call your pharmacy.

## 2018-05-12 NOTE — [place] Notes (Signed)
Cardiology Office Note:    Date:  05/12/2018   ID:  CLARIE Grace Singh, DOB 1966/04/09, MRN [id number]  PCP:  Rubie Maid, MD  Cardiologist:  Jenean Lindau, MD   Referring MD: Rubie Maid, MD    ASSESSMENT:    1. Essential hypertension   2. Uncontrolled type 2 diabetes mellitus with microalbuminuria, with long-term current use of insulin ([place])   3. Hypertriglyceridemia   4. Dyslipidemia    PLAN:    In order of problems listed above:  1. Primary prevention stressed with the patient.  Importance of compliance with diet and medications stressed and she vocalized understanding.  Her blood pressure is stable. 2. Discussed with her about compliance with diet in view of better management of obesity, diabetes mellitus and hypertriglyceridemia and she vocalized understanding.  She is not taking her bicep on a regular basis.  We will check her lipids today in addition to other labs and advice. 3. Patient will be seen in follow-up appointment in 6 months or earlier if the patient has any concerns    Medication Adjustments/Labs and Tests Ordered: Current medicines are reviewed at length with the patient today.  Concerns regarding medicines are outlined above.  Orders Placed This Encounter  Procedures  . Basic metabolic panel  . CBC  . Hepatic function panel  . Lipid Profile  . EKG 12-Lead   No orders of the defined types were placed in this encounter.    No chief complaint on file.    History of Present Illness:    Grace Singh is a 52 y.o. female.  The patient has past medical history of essential hypertension, dyslipidemia and diabetes mellitus.  She has market hypertriglyceridemia.  This is been documented in the past.  She tells me that she has been lax with her diet and taking medications.  She denies any chest pain orthopnea or PND.  She leads a sedentary lifestyle.  She is here for regular follow-up.  Past Medical History:  Diagnosis Date  . AKI (acute  kidney injury) ([place]) 07/02/2013  . Anxiety   . 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 Atrophic kidney    LEFT; functions at 33%  . CKD (chronic kidney disease), stage III ([place])    In record, but baseline kidney function appears to have GFR > 90; rt kidney  function 67%  . Depression   . Diabetes mellitus, type 2 ([place])   . Edema of both legs    and ankles  . Fibromyalgia   . Headache    migraines  . History of kidney stones   . History of sepsis    05-11-2013  W/ UROSEPSIS---  RESOLIVED//  03-28-2014 SEVERE SEPSIS W/ SIRS---  RESOLVED  . Hyperlipidemia   . Hypertension   . IBS (irritable bowel syndrome)    IBSD  . Iron deficiency anemia   . Normal cardiac stress test    PER PT 2007  . PMB (postmenopausal bleeding)   . Renal calculi    bilateral  . Seasonal asthma    seasonal  . Severe sepsis ([place]) 07/02/2013  . UTI (urinary tract infection) 07/02/2013  . Wears glasses     Past Surgical History:  Procedure Laterality Date  . APPENDECTOMY  AGE 64  . COCCYX REMOVAL  2009  . CYSTOSCOPY W/ URETERAL STENT PLACEMENT Right 06/13/2013   Procedure: CYSTOSCOPY WITH STENT REPLACEMENT, right;  Surgeon: Alexis Frock, MD;  Location: [facility];  Service: Urology;  Laterality: Right;  . CYSTOSCOPY WITH RETROGRADE PYELOGRAM, URETEROSCOPY AND STENT PLACEMENT Right 06/13/2013   Procedure: First Stage Bilateral Ureteroscopy;  Surgeon: Alexis Frock, MD;  Location: [facility];  Service: Urology;  Laterality: Right;  . CYSTOSCOPY WITH RETROGRADE PYELOGRAM, URETEROSCOPY AND STENT PLACEMENT Bilateral 06/20/2013   Procedure: CYSTOSCOPY WITH RETROGRADE PYELOGRAM, URETEROSCOPY AND STENT PLACEMENT, REMOVAL BILATERAL STENTS;  Surgeon: Alexis Frock, MD;  Location: [facility];  Service: Urology;  Laterality: Bilateral;  Bilateral stent repalcement with not string  . CYSTOSCOPY WITH STENT PLACEMENT Bilateral 05/11/2013   Procedure: CYSTOSCOPY WITH  STENT PLACEMENT;  Surgeon: Alexis Frock, MD;  Location: [facility];  Service: Urology;  Laterality: Bilateral;  . DILATATION & CURETTAGE/HYSTEROSCOPY WITH MYOSURE N/A 06/18/2015   Procedure: DILATATION & CURETTAGE/HYSTEROSCOPY WITH MYOSURE;  Surgeon: [name], MD;  Location: [place];  Service: Gynecology;  Laterality: N/A;  . EYE SURGERY     radial karatomtomy-bilaterl  . HOLMIUM LASER APPLICATION Right 05/14/6788   Procedure: HOLMIUM LASER APPLICATION, right ureteral stone;  Surgeon: Alexis Frock, MD;  Location: [facility];  Service: Urology;  Laterality: Right;  . HOLMIUM LASER APPLICATION Right 3/81/0175   Procedure: HOLMIUM LASER APPLICATION;  Surgeon: Alexis Frock, MD;  Location: [facility];  Service: Urology;  Laterality: Right;  . HYSTEROSCOPY W/D&C N/A 04/20/2013   Procedure: DILATATION AND CURETTAGE /HYSTEROSCOPY;  Surgeon: [name], MD;  Location: [facility];  Service: Gynecology;  Laterality: N/A;  . LAPAROSCOPIC CHOLECYSTECTOMY  AGE 89  . LAPAROSCOPIC GASTRIC RESECTION N/A 12/03/2016   Procedure: LAPAROSCOPIC RESECTION OF POSTERIOR GASTRIC GIST WITH OMENTOPEXY. INCISIONAL UMBILICAL HERNIA REPAIR. ;  Surgeon: [name], MD;  Location: [facility];  Service: General;  Laterality: N/A;  . POLYPECTOMY N/A 04/20/2013   Procedure: POLYPECTOMY;  Surgeon: [name], MD;  Location: [facility];  Service: Gynecology;  Laterality: N/A;  . SPINAL CORD STIMULATOR IMPLANT    . SPINAL CORD STIMULATOR IMPLANT  09/04/2014    Current Medications: Current Meds  Medication Sig  . amitriptyline (ELAVIL) 100 MG tablet Take 100 mg by mouth at bedtime.  Marland Kitchen buPROPion (WELLBUTRIN) 75 MG tablet Take 75 mg by mouth 2 (two) times daily.  . Cholecalciferol (VITAMIN D-3) 5000 units TABS Take 5,000 Units by mouth daily.  . citalopram (CELEXA) 40 MG tablet Take 40 mg by mouth daily.   . ferrous sulfate 325 (65 FE) MG tablet Take 325 mg by mouth daily.     Marland Kitchen gemfibrozil (LOPID) 600 MG tablet Take 1 tablet (600 mg total) by mouth 2 (two) times daily.  . hydrochlorothiazide (HYDRODIURIL) 25 MG tablet Take 25 mg by mouth daily.  Marland Kitchen lamoTRIgine (LAMICTAL) 100 MG tablet Take 100 mg by mouth at bedtime.   Marland Kitchen LANTUS SOLOSTAR 100 UNIT/ML Solostar Pen Inject 60 Units into the skin at bedtime.  Marland Kitchen lisinopril (PRINIVIL,ZESTRIL) 20 MG tablet Take 20 mg by mouth daily.  . metFORMIN (GLUCOPHAGE) 1000 MG tablet Take 1,000 mg by mouth 2 (two) times daily.  Marland Kitchen oxyCODONE-acetaminophen (PERCOCET) 7.5-325 MG tablet Take 1 tablet by mouth 3 (three) times daily as needed for pain.  . pioglitazone (ACTOS) 45 MG tablet Take 45 mg by mouth daily.  . promethazine (PHENERGAN) 25 MG tablet Take 25 mg by mouth every 8 (eight) hours as needed for nausea/vomiting.  . rosuvastatin (CRESTOR) 20 MG tablet Take 20 mg by mouth daily.  Marland Kitchen tiZANidine (ZANAFLEX) 4 MG tablet Take 4 mg by mouth every 8 (  eight) hours as needed for spasms.  Marland Kitchen topiramate (TOPAMAX) 50 MG tablet Take 50 mg by mouth 2 (two) times daily.  Marland Kitchen VASCEPA 1 g CAPS Take 1 g by mouth 2 (two) times daily.  Marland Kitchen VICTOZA 18 MG/3ML SOPN Inject 1.8 mg into the skin at bedtime.     Allergies:   Patient has no known allergies.   Social History   Socioeconomic History  . Marital status: Married    Spouse name: Not on file  . Number of children: Not on file  . Years of education: Not on file  . Highest education level: Not on file  Occupational History  . Not on file  Social Needs  . Financial resource strain: Not on file  . Food insecurity:    Worry: Not on file    Inability: Not on file  . Transportation needs:    Medical: Not on file    Non-medical: Not on file  Tobacco Use  . Smoking status: Never Smoker  . Smokeless tobacco: Never Used  Substance and Sexual Activity  . Alcohol use: Yes    Comment: rare  . Drug use: No  . Sexual activity: Not on file  Lifestyle  . Physical activity:    Days per week: Not  on file    Minutes per session: Not on file  . Stress: Not on file  Relationships  . Social connections:    Talks on phone: Not on file    Gets together: Not on file    Attends religious service: Not on file    Active member of club or organization: Not on file    Attends meetings of clubs or organizations: Not on file    Relationship status: Not on file  Other Topics Concern  . Not on file  Social History Narrative  . Not on file     Family History: The patient's family history includes Heart disease in her father.  ROS:   Please see the history of present illness.    All other systems reviewed and are negative.  EKGs/Labs/Other Studies Reviewed:    The following studies were reviewed today: EKG reveals sinus rhythm and nonspecific ST-T changes   Recent Labs: No results found for requested labs within last 8760 hours.  Recent Lipid Panel    Component Value Date/Time   CHOL 262 (H) 03/29/2014 0230   TRIG 1,631 (H) 03/29/2014 0230   HDL NOT REPORTED DUE TO HIGH TRIGLYCERIDES 03/29/2014 0230   CHOLHDL NOT REPORTED DUE TO HIGH TRIGLYCERIDES 03/29/2014 0230   VLDL UNABLE TO CALCULATE IF TRIGLYCERIDE OVER 400 mg/dL 03/29/2014 0230   LDLCALC UNABLE TO CALCULATE IF TRIGLYCERIDE OVER 400 mg/dL 03/29/2014 0230    Physical Exam:    VS:  BP 118/72 (BP Location: Left Arm, Patient Position: Sitting, Cuff Size: Normal)   Pulse 85   Ht 5\' 6"  (1.676 m)   Wt 279 lb (126.6 kg)   LMP 03/07/2014   SpO2 97%   BMI 45.03 kg/m     Wt Readings from Last 3 Encounters:  05/12/18 279 lb (126.6 kg)  08/03/17 280 lb 1.9 oz (127.1 kg)  12/03/16 274 lb (124.3 kg)     GEN: Patient is in no acute distress HEENT: Normal NECK: No JVD; No carotid bruits LYMPHATICS: No lymphadenopathy CARDIAC: Hear sounds regular, 2/6 systolic murmur at the apex. RESPIRATORY:  Clear to auscultation without rales, wheezing or rhonchi  ABDOMEN: Soft, non-tender, non-distended MUSCULOSKELETAL:  No edema; No  deformity  SKIN: Warm and dry NEUROLOGIC:  Alert and oriented x 3 PSYCHIATRIC:  Normal affect   Signed, Jenean Lindau, MD  05/12/2018 10:42 AM    [place]

## 2018-05-13 LAB — CBC
HEMATOCRIT: 40.9 % (ref 34.0–46.6)
HEMOGLOBIN: 13.4 g/dL (ref 11.1–15.9)
MCH: 26.9 pg (ref 26.6–33.0)
MCHC: 32.8 g/dL (ref 31.5–35.7)
MCV: 82 fL (ref 79–97)
Platelets: 333 10*3/uL (ref 150–450)
RBC: 4.99 x10E6/uL (ref 3.77–5.28)
RDW: 15.4 % (ref 12.3–15.4)
WBC: 10.5 10*3/uL (ref 3.4–10.8)

## 2018-05-13 LAB — LIPID PANEL
CHOL/HDL RATIO: 3.7 ratio (ref 0.0–4.4)
Cholesterol, Total: 146 mg/dL (ref 100–199)
HDL: 40 mg/dL (ref >39)
LDL Calculated: 33 mg/dL (ref 0–99)
Triglycerides: 365 mg/dL — ABNORMAL HIGH (ref 0–149)
VLDL Cholesterol Cal: 73 mg/dL — ABNORMAL HIGH (ref 5–40)

## 2018-05-13 LAB — BASIC METABOLIC PANEL
BUN / CREAT RATIO: 25 — AB (ref 9–23)
BUN: 17 mg/dL (ref 6–24)
CALCIUM: 9.7 mg/dL (ref 8.7–10.2)
CO2: 24 mmol/L (ref 20–29)
CREATININE: 0.67 mg/dL (ref 0.57–1.00)
Chloride: 99 mmol/L (ref 96–106)
GFR calc Af Amer: 115 mL/min/{1.73_m2} (ref >59)
GFR calc non Af Amer: 100 mL/min/{1.73_m2} (ref >59)
GLUCOSE: 129 mg/dL — AB (ref 65–99)
Potassium: 3.9 mmol/L (ref 3.5–5.2)
Sodium: 140 mmol/L (ref 134–144)

## 2018-05-13 LAB — HEPATIC FUNCTION PANEL
ALK PHOS: 94 IU/L (ref 39–117)
ALT: 14 IU/L (ref 0–32)
AST: 14 IU/L (ref 0–40)
Albumin: 4.3 g/dL (ref 3.5–5.5)
BILIRUBIN, DIRECT: 0.07 mg/dL (ref 0.00–0.40)
Bilirubin Total: <0.2 mg/dL (ref 0.0–1.2)
Total Protein: 6.6 g/dL (ref 6.0–8.5)

## 2018-05-15 ENCOUNTER — Telehealth: Payer: Self-pay | Admitting: *Deleted

## 2018-05-15 DIAGNOSIS — E785 Hyperlipidemia, unspecified: Secondary | ICD-10-CM

## 2018-05-15 MED ORDER — FENOFIBRATE 200 MG PO CAPS: 200.0000 mg | ORAL_CAPSULE | Freq: Every day | ORAL | 11 refills | Status: DC

## 2018-05-15 MED ORDER — FISH OIL 1000 MG PO CAPS: 2000.0000 mg | ORAL_CAPSULE | Freq: Two times a day (BID) | ORAL | 0 refills | Status: DC

## 2018-05-15 MED ORDER — ROSUVASTATIN CALCIUM 10 MG PO TABS: 10.0000 mg | ORAL_TABLET | Freq: Every day | ORAL | 3 refills | Status: DC

## 2018-05-15 NOTE — Telephone Encounter (Signed)
Returned call to patient. Patient is currently driving and is unable to write down instructions. Patient should be home in around 45 minutes and will return call then.

## 2018-05-15 NOTE — Telephone Encounter (Signed)
Pt went to pharmacy to pick up the Rx for Omega 3 Oil and her copay was $180.00. She stated she will not be getting that medication and to let her know if there is a cheaper version she should try. Please advise

## 2018-05-15 NOTE — Telephone Encounter (Signed)
Please advise 

## 2018-05-15 NOTE — Telephone Encounter (Signed)
Patient returned call. Advised patient per instruction from Dr. Geraldo Pitter on recent lab work to decrease rosuvastatin to 10 mg daily, stop gemfibrozil, start fenofibrate 200 mg daily, start fish oil 2000 mg twice daily over the counter. Patient verbalized understanding with accurate read back of medication instructions. Patient verbalized understanding to come fasting for lab work in 6 weeks. Patient will come to the [facility] office for this. No further questions.

## 2018-05-16 DIAGNOSIS — F410 Panic disorder [episodic paroxysmal anxiety] without agoraphobia: Secondary | ICD-10-CM | POA: Diagnosis not present

## 2018-05-23 DIAGNOSIS — I10 Essential (primary) hypertension: Secondary | ICD-10-CM | POA: Diagnosis not present

## 2018-05-23 DIAGNOSIS — E782 Mixed hyperlipidemia: Secondary | ICD-10-CM | POA: Diagnosis not present

## 2018-05-23 DIAGNOSIS — E1129 Type 2 diabetes mellitus with other diabetic kidney complication: Secondary | ICD-10-CM | POA: Diagnosis not present

## 2018-05-23 DIAGNOSIS — Z794 Long term (current) use of insulin: Secondary | ICD-10-CM | POA: Diagnosis not present

## 2018-05-23 DIAGNOSIS — R809 Proteinuria, unspecified: Secondary | ICD-10-CM | POA: Diagnosis not present

## 2018-07-10 DIAGNOSIS — E785 Hyperlipidemia, unspecified: Secondary | ICD-10-CM | POA: Diagnosis not present

## 2018-07-11 LAB — HEPATIC FUNCTION PANEL
ALBUMIN: 3.9 g/dL (ref 3.5–5.5)
ALT: 9 IU/L (ref 0–32)
AST: 11 IU/L (ref 0–40)
Alkaline Phosphatase: 40 IU/L (ref 39–117)
BILIRUBIN, DIRECT: 0.07 mg/dL (ref 0.00–0.40)
Total Protein: 6.2 g/dL (ref 6.0–8.5)

## 2018-07-11 LAB — LIPID PANEL
CHOL/HDL RATIO: 3.5 ratio (ref 0.0–4.4)
Cholesterol, Total: 165 mg/dL (ref 100–199)
HDL: 47 mg/dL (ref >39)
LDL CALC: 70 mg/dL (ref 0–99)
Triglycerides: 241 mg/dL — ABNORMAL HIGH (ref 0–149)
VLDL CHOLESTEROL CAL: 48 mg/dL — AB (ref 5–40)

## 2018-07-12 ENCOUNTER — Other Ambulatory Visit: Payer: Self-pay

## 2018-07-12 DIAGNOSIS — E785 Hyperlipidemia, unspecified: Secondary | ICD-10-CM

## 2018-07-12 MED ORDER — VASCEPA 1 G PO CAPS
2.0000 g | ORAL_CAPSULE | Freq: Two times a day (BID) | ORAL | 2 refills | Status: DC
Start: 2018-07-12 — End: 2019-01-10

## 2018-07-12 MED ORDER — ROSUVASTATIN CALCIUM 20 MG PO TABS: 20.0000 mg | ORAL_TABLET | Freq: Every day | ORAL | 2 refills | Status: DC

## 2018-07-14 DIAGNOSIS — I10 Essential (primary) hypertension: Secondary | ICD-10-CM | POA: Diagnosis not present

## 2018-07-14 DIAGNOSIS — M25552 Pain in left hip: Secondary | ICD-10-CM | POA: Diagnosis not present

## 2018-07-14 DIAGNOSIS — R110 Nausea: Secondary | ICD-10-CM | POA: Diagnosis not present

## 2018-07-14 DIAGNOSIS — M25551 Pain in right hip: Secondary | ICD-10-CM | POA: Diagnosis not present

## 2018-07-20 DIAGNOSIS — M5417 Radiculopathy, lumbosacral region: Secondary | ICD-10-CM | POA: Diagnosis not present

## 2018-07-20 DIAGNOSIS — Z79899 Other long term (current) drug therapy: Secondary | ICD-10-CM | POA: Diagnosis not present

## 2018-07-20 DIAGNOSIS — M25551 Pain in right hip: Secondary | ICD-10-CM | POA: Diagnosis not present

## 2018-07-20 DIAGNOSIS — Z5181 Encounter for therapeutic drug level monitoring: Secondary | ICD-10-CM | POA: Diagnosis not present

## 2018-07-20 DIAGNOSIS — M533 Sacrococcygeal disorders, not elsewhere classified: Secondary | ICD-10-CM | POA: Diagnosis not present

## 2018-07-20 DIAGNOSIS — G894 Chronic pain syndrome: Secondary | ICD-10-CM | POA: Diagnosis not present

## 2018-08-14 DIAGNOSIS — F329 Major depressive disorder, single episode, unspecified: Secondary | ICD-10-CM | POA: Diagnosis not present

## 2018-09-25 DIAGNOSIS — M2042 Other hammer toe(s) (acquired), left foot: Secondary | ICD-10-CM | POA: Diagnosis not present

## 2018-09-25 DIAGNOSIS — M2041 Other hammer toe(s) (acquired), right foot: Secondary | ICD-10-CM | POA: Diagnosis not present

## 2018-09-25 DIAGNOSIS — M24574 Contracture, right foot: Secondary | ICD-10-CM | POA: Diagnosis not present

## 2018-09-25 DIAGNOSIS — E119 Type 2 diabetes mellitus without complications: Secondary | ICD-10-CM | POA: Diagnosis not present

## 2018-09-25 DIAGNOSIS — M24575 Contracture, left foot: Secondary | ICD-10-CM | POA: Diagnosis not present

## 2018-10-24 DIAGNOSIS — F4310 Post-traumatic stress disorder, unspecified: Secondary | ICD-10-CM | POA: Diagnosis not present

## 2018-10-25 DIAGNOSIS — M533 Sacrococcygeal disorders, not elsewhere classified: Secondary | ICD-10-CM | POA: Diagnosis not present

## 2018-10-25 DIAGNOSIS — M25551 Pain in right hip: Secondary | ICD-10-CM | POA: Diagnosis not present

## 2018-10-25 DIAGNOSIS — G894 Chronic pain syndrome: Secondary | ICD-10-CM | POA: Diagnosis not present

## 2018-10-25 DIAGNOSIS — M25552 Pain in left hip: Secondary | ICD-10-CM | POA: Diagnosis not present

## 2018-10-31 DIAGNOSIS — N3020 Other chronic cystitis without hematuria: Secondary | ICD-10-CM | POA: Diagnosis not present

## 2018-10-31 DIAGNOSIS — N200 Calculus of kidney: Secondary | ICD-10-CM | POA: Diagnosis not present

## 2018-11-22 ENCOUNTER — Ambulatory Visit: Payer: PPO | Admitting: Cardiology

## 2018-11-23 DIAGNOSIS — E1129 Type 2 diabetes mellitus with other diabetic kidney complication: Secondary | ICD-10-CM | POA: Diagnosis not present

## 2018-11-23 DIAGNOSIS — I10 Essential (primary) hypertension: Secondary | ICD-10-CM | POA: Diagnosis not present

## 2018-11-23 DIAGNOSIS — R809 Proteinuria, unspecified: Secondary | ICD-10-CM | POA: Diagnosis not present

## 2018-11-23 DIAGNOSIS — E782 Mixed hyperlipidemia: Secondary | ICD-10-CM | POA: Diagnosis not present

## 2018-11-23 DIAGNOSIS — Z794 Long term (current) use of insulin: Secondary | ICD-10-CM | POA: Diagnosis not present

## 2018-11-27 ENCOUNTER — Ambulatory Visit: Payer: PPO | Admitting: Cardiology

## 2018-12-11 ENCOUNTER — Ambulatory Visit: Payer: PPO | Admitting: Cardiology

## 2018-12-12 ENCOUNTER — Ambulatory Visit: Payer: PPO | Admitting: Cardiology

## 2018-12-12 DIAGNOSIS — R0989 Other specified symptoms and signs involving the circulatory and respiratory systems: Secondary | ICD-10-CM

## 2018-12-18 DIAGNOSIS — D235 Other benign neoplasm of skin of trunk: Secondary | ICD-10-CM | POA: Diagnosis not present

## 2018-12-18 DIAGNOSIS — L821 Other seborrheic keratosis: Secondary | ICD-10-CM | POA: Diagnosis not present

## 2019-01-10 ENCOUNTER — Encounter: Payer: Self-pay | Admitting: Cardiology

## 2019-01-10 ENCOUNTER — Ambulatory Visit (INDEPENDENT_AMBULATORY_CARE_PROVIDER_SITE_OTHER): Payer: PPO | Admitting: Cardiology

## 2019-01-10 VITALS — BP 126/70 | HR 88 | Ht 66.0 in | Wt 305.0 lb

## 2019-01-10 DIAGNOSIS — E781 Pure hyperglyceridemia: Secondary | ICD-10-CM | POA: Diagnosis not present

## 2019-01-10 DIAGNOSIS — I10 Essential (primary) hypertension: Secondary | ICD-10-CM | POA: Diagnosis not present

## 2019-01-10 MED ORDER — ASPIRIN EC 81 MG PO TBEC: 81.0000 mg | DELAYED_RELEASE_TABLET | Freq: Every day | ORAL | 3 refills | Status: AC

## 2019-01-10 NOTE — Progress Notes (Signed)
Cardiology Office Note:    Date:  01/10/2019   ID:  Grace Singh, DOB Nov 30, 1966, MRN [id number]  PCP:  Rubie Maid, MD  Cardiologist:  Jenean Lindau, MD   Referring MD: Rubie Maid, MD    ASSESSMENT:    1. Essential hypertension   2. Hypertriglyceridemia    PLAN:    In order of problems listed above:  1. Primary prevention stressed with the patient.  Importance of compliance with diet and medication stressed and she vocalized understanding.  Her blood pressure is stable.  Diet was discussed for obesity and dyslipidemia and risks explained and she vocalized understanding plans to take this up seriously and be motivated to exercise and lose weight by meticulous dietary interventions. 2. Patient will be seen in follow-up appointment in 6 months or earlier if the patient has any concerns    Medication Adjustments/Labs and Tests Ordered: Current medicines are reviewed at length with the patient today.  Concerns regarding medicines are outlined above.  No orders of the defined types were placed in this encounter.  No orders of the defined types were placed in this encounter.    No chief complaint on file.    History of Present Illness:    Grace Singh is a 53 y.o. female.  Patient has history of essential hypertension, dyslipidemia and morbid obesity.  She denies any problems at this time and takes care of activities of daily living.  No chest pain orthopnea or PND.  At the time of my evaluation, the patient is alert awake oriented and in no distress.Marland Kitchen  Unfortunately she has not been compliant with diet and has lived a sedentary lifestyle.  She plans to do better.  Past Medical History:  Diagnosis Date  . AKI (acute kidney injury) ([place]) 07/02/2013  . Anxiety   . 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 Atrophic kidney    LEFT; functions at 33%  . CKD (chronic kidney disease), stage III ([place])    In record, but baseline kidney function appears to  have GFR > 90; rt kidney  function 67%  . Depression   . Diabetes mellitus, type 2 ([place])   . Edema of both legs    and ankles  . Fibromyalgia   . Headache    migraines  . History of kidney stones   . History of sepsis    05-11-2013  W/ UROSEPSIS---  RESOLIVED//  03-28-2014 SEVERE SEPSIS W/ SIRS---  RESOLVED  . Hyperlipidemia   . Hypertension   . IBS (irritable bowel syndrome)    IBSD  . Iron deficiency anemia   . Normal cardiac stress test    PER PT 2007  . PMB (postmenopausal bleeding)   . Renal calculi    bilateral  . Seasonal asthma    seasonal  . Severe sepsis ([place]) 07/02/2013  . UTI (urinary tract infection) 07/02/2013  . Wears glasses     Past Surgical History:  Procedure Laterality Date  . APPENDECTOMY  AGE 61  . COCCYX REMOVAL  2009  . CYSTOSCOPY W/ URETERAL STENT PLACEMENT Right 06/13/2013   Procedure: CYSTOSCOPY WITH STENT REPLACEMENT, right;  Surgeon: Alexis Frock, MD;  Location[facility];  Service: Urology;  Laterality: Right;  . CYSTOSCOPY WITH RETROGRADE PYELOGRAM, URETEROSCOPY AND STENT PLACEMENT Right 06/13/2013   Procedure: First Stage Bilateral Ureteroscopy;  Surgeon: Alexis Frock, MD;  Location: [facility];  Service: Urology;  Laterality: Right;  . CYSTOSCOPY WITH  RETROGRADE PYELOGRAM, URETEROSCOPY AND STENT PLACEMENT Bilateral 06/20/2013   Procedure: CYSTOSCOPY WITH RETROGRADE PYELOGRAM, URETEROSCOPY AND STENT PLACEMENT, REMOVAL BILATERAL STENTS;  Surgeon: Alexis Frock, MD;  Location: [facility];  Service: Urology;  Laterality: Bilateral;  Bilateral stent repalcement with not string  . CYSTOSCOPY WITH STENT PLACEMENT Bilateral 05/11/2013   Procedure: CYSTOSCOPY WITH STENT PLACEMENT;  Surgeon: Alexis Frock, MD;  Location: [facility];  Service: Urology;  Laterality: Bilateral;  . DILATATION & CURETTAGE/HYSTEROSCOPY WITH MYOSURE N/A 06/18/2015   Procedure: DILATATION & CURETTAGE/HYSTEROSCOPY WITH MYOSURE;  Surgeon:  [name], MD;  Location: [facility];  Service: Gynecology;  Laterality: N/A;  . EYE SURGERY     radial karatomtomy-bilaterl  . HOLMIUM LASER APPLICATION Right 02/05/3556   Procedure: HOLMIUM LASER APPLICATION, right ureteral stone;  Surgeon: Alexis Frock, MD;  Location[facility];  Service: Urology;  Laterality: Right;  . HOLMIUM LASER APPLICATION Right 02/24/253   Procedure: HOLMIUM LASER APPLICATION;  Surgeon: Alexis Frock, MD;  Location: [facility];  Service: Urology;  Laterality: Right;  . HYSTEROSCOPY W/D&C N/A 04/20/2013   Procedure: DILATATION AND CURETTAGE /HYSTEROSCOPY;  Surgeon: [name], MD;  Location: [facility];  Service: Gynecology;  Laterality: N/A;  . LAPAROSCOPIC CHOLECYSTECTOMY  AGE 51  . LAPAROSCOPIC GASTRIC RESECTION N/A 12/03/2016   Procedure: LAPAROSCOPIC RESECTION OF POSTERIOR GASTRIC GIST WITH OMENTOPEXY. INCISIONAL UMBILICAL HERNIA REPAIR. ;  Surgeon: [name], MD;  Location: [facility];  Service: General;  Laterality: N/A;  . POLYPECTOMY N/A 04/20/2013   Procedure: POLYPECTOMY;  Surgeon: [name], MD;  Location: [facility];  Service: Gynecology;  Laterality: N/A;  . SPINAL CORD STIMULATOR IMPLANT    . SPINAL CORD STIMULATOR IMPLANT  09/04/2014    Current Medications: Current Meds  Medication Sig  . albuterol (PROVENTIL HFA;VENTOLIN HFA) 108 (90 BASE) MCG/ACT inhaler Inhale 2 puffs into the lungs every 6 (six) hours as needed for wheezing.  Marland Kitchen amitriptyline (ELAVIL) 100 MG tablet Take 100 mg by mouth at bedtime.  Marland Kitchen aspirin 325 MG tablet Take 325 mg by mouth daily.  Marland Kitchen buPROPion (WELLBUTRIN) 75 MG tablet Take 75 mg by mouth 2 (two) times daily.  . Cholecalciferol (VITAMIN D-3) 5000 units TABS Take 5,000 Units by mouth daily.  . citalopram (CELEXA) 40 MG tablet Take 40 mg by mouth daily.   . fenofibrate micronized (LOFIBRA) 200 MG capsule Take 1 capsule (200 mg total) by mouth daily before breakfast.  .  ferrous sulfate 325 (65 FE) MG tablet Take 325 mg by mouth daily.   . hydrochlorothiazide (HYDRODIURIL) 25 MG tablet Take 25 mg by mouth daily.  Marland Kitchen lamoTRIgine (LAMICTAL) 100 MG tablet Take 100 mg by mouth at bedtime.   Marland Kitchen LANTUS SOLOSTAR 100 UNIT/ML Solostar Pen Inject 60 Units into the skin at bedtime.  Marland Kitchen lisinopril (PRINIVIL,ZESTRIL) 10 MG tablet Take 10 mg by mouth daily.   . metFORMIN (GLUCOPHAGE) 1000 MG tablet Take 1,000 mg by mouth 2 (two) times daily.  . ondansetron (ZOFRAN-ODT) 8 MG disintegrating tablet Take 1 tablet by mouth as needed.  Marland Kitchen oxyCODONE-acetaminophen (PERCOCET) 7.5-325 MG tablet Take 1 tablet by mouth 3 (three) times daily as needed for pain.  . pioglitazone (ACTOS) 45 MG tablet Take 45 mg by mouth daily.  . promethazine (PHENERGAN) 25 MG tablet Take 25 mg by mouth every 8 (eight) hours as needed for nausea/vomiting.  . topiramate (TOPAMAX) 50 MG tablet Take 50 mg by mouth 2 (two) times daily.  Marland Kitchen VICTOZA 18  MG/3ML SOPN Inject 1.8 mg into the skin at bedtime.     Allergies:   Patient has no known allergies.   Social History   Socioeconomic History  . Marital status: Married    Spouse name: Not on file  . Number of children: Not on file  . Years of education: Not on file  . Highest education level: Not on file  Occupational History  . Not on file  Social Needs  . Financial resource strain: Not on file  . Food insecurity:    Worry: Not on file    Inability: Not on file  . Transportation needs:    Medical: Not on file    Non-medical: Not on file  Tobacco Use  . Smoking status: Never Smoker  . Smokeless tobacco: Never Used  Substance and Sexual Activity  . Alcohol use: Yes    Comment: rare  . Drug use: No  . Sexual activity: Not on file  Lifestyle  . Physical activity:    Days per week: Not on file    Minutes per session: Not on file  . Stress: Not on file  Relationships  . Social connections:    Talks on phone: Not on file    Gets together: Not on  file    Attends religious service: Not on file    Active member of club or organization: Not on file    Attends meetings of clubs or organizations: Not on file    Relationship status: Not on file  Other Topics Concern  . Not on file  Social History Narrative  . Not on file     Family History: The patient's family history includes Heart disease in her father.  ROS:   Please see the history of present illness.    All other systems reviewed and are negative.  EKGs/Labs/Other Studies Reviewed:    The following studies were reviewed today: I discussed my findings with the patient at extensive length.   Recent Labs: 05/12/2018: BUN 17; Creatinine, Ser 0.67; Hemoglobin 13.4; Platelets 333; Potassium 3.9; Sodium 140 07/10/2018: ALT 9  Recent Lipid Panel    Component Value Date/Time   CHOL 165 07/10/2018 1035   TRIG 241 (H) 07/10/2018 1035   HDL 47 07/10/2018 1035   CHOLHDL 3.5 07/10/2018 1035   CHOLHDL NOT REPORTED DUE TO HIGH TRIGLYCERIDES 03/29/2014 0230   VLDL UNABLE TO CALCULATE IF TRIGLYCERIDE OVER 400 mg/dL 03/29/2014 0230   LDLCALC 70 07/10/2018 1035    Physical Exam:    VS:  BP 126/70 (BP Location: Right Arm, Patient Position: Sitting, Cuff Size: Normal)   Pulse 88   Ht 5\' 6"  (1.676 m)   Wt (!) 305 lb (138.3 kg)   LMP 03/07/2014   SpO2 97%   BMI 49.23 kg/m     Wt Readings from Last 3 Encounters:  01/10/19 (!) 305 lb (138.3 kg)  05/12/18 279 lb (126.6 kg)  08/03/17 280 lb 1.9 oz (127.1 kg)     GEN: Patient is in no acute distress HEENT: Normal NECK: No JVD; No carotid bruits LYMPHATICS: No lymphadenopathy CARDIAC: Hear sounds regular, 2/6 systolic murmur at the apex. RESPIRATORY:  Clear to auscultation without rales, wheezing or rhonchi  ABDOMEN: Soft, non-tender, non-distended MUSCULOSKELETAL:  No edema; No deformity  SKIN: Warm and dry NEUROLOGIC:  Alert and oriented x 3 PSYCHIATRIC:  Normal affect   Signed, Jenean Lindau, MD  01/10/2019 11:53 AM      [place]

## 2019-01-10 NOTE — Patient Instructions (Signed)
Medication Instructions:  Your physician has recommended you make the following change in your medication:   DECREASE aspirin 81 mg: Take 1 tablet daily   If you need a refill on your cardiac medications before your next appointment, please call your pharmacy.   Lab work: None  If you have labs (blood work) drawn today and your tests are completely normal, you will receive your results only by: Marland Kitchen MyChart Message (if you have MyChart) OR . A paper copy in the mail If you have any lab test that is abnormal or we need to change your treatment, we will call you to review the results.  Testing/Procedures: None  Follow-Up: At [facility], you and your health needs are our priority.  As part of our continuing mission to provide you with exceptional heart care, we have created designated Provider Care Teams.  These Care Teams include your primary Cardiologist (physician) and Advanced Practice Providers (APPs -  Physician Assistants and Nurse Practitioners) who all work together to provide you with the care you need, when you need it. You will need a follow up appointment in 6 months.  Please call our office 2 months in advance to schedule this appointment.

## 2019-01-18 DIAGNOSIS — G894 Chronic pain syndrome: Secondary | ICD-10-CM | POA: Diagnosis not present

## 2019-01-18 DIAGNOSIS — G5712 Meralgia paresthetica, left lower limb: Secondary | ICD-10-CM | POA: Diagnosis not present

## 2019-01-18 DIAGNOSIS — M25551 Pain in right hip: Secondary | ICD-10-CM | POA: Diagnosis not present

## 2019-01-18 DIAGNOSIS — Z79899 Other long term (current) drug therapy: Secondary | ICD-10-CM | POA: Diagnosis not present

## 2019-01-18 DIAGNOSIS — M533 Sacrococcygeal disorders, not elsewhere classified: Secondary | ICD-10-CM | POA: Diagnosis not present

## 2019-01-18 DIAGNOSIS — Z5181 Encounter for therapeutic drug level monitoring: Secondary | ICD-10-CM | POA: Diagnosis not present

## 2019-01-25 DIAGNOSIS — F4310 Post-traumatic stress disorder, unspecified: Secondary | ICD-10-CM | POA: Diagnosis not present

## 2019-02-13 DIAGNOSIS — D649 Anemia, unspecified: Secondary | ICD-10-CM | POA: Diagnosis not present

## 2019-02-13 DIAGNOSIS — I10 Essential (primary) hypertension: Secondary | ICD-10-CM | POA: Diagnosis not present

## 2019-02-13 DIAGNOSIS — M533 Sacrococcygeal disorders, not elsewhere classified: Secondary | ICD-10-CM | POA: Diagnosis not present

## 2019-02-13 DIAGNOSIS — G894 Chronic pain syndrome: Secondary | ICD-10-CM | POA: Diagnosis not present

## 2019-02-13 DIAGNOSIS — E119 Type 2 diabetes mellitus without complications: Secondary | ICD-10-CM | POA: Diagnosis not present

## 2019-02-13 DIAGNOSIS — M797 Fibromyalgia: Secondary | ICD-10-CM | POA: Diagnosis not present

## 2019-02-13 DIAGNOSIS — F329 Major depressive disorder, single episode, unspecified: Secondary | ICD-10-CM | POA: Diagnosis not present

## 2019-02-13 DIAGNOSIS — G5712 Meralgia paresthetica, left lower limb: Secondary | ICD-10-CM | POA: Diagnosis not present

## 2019-02-13 DIAGNOSIS — F419 Anxiety disorder, unspecified: Secondary | ICD-10-CM | POA: Diagnosis not present

## 2019-02-13 DIAGNOSIS — Z6841 Body Mass Index (BMI) 40.0 and over, adult: Secondary | ICD-10-CM | POA: Diagnosis not present

## 2019-02-13 DIAGNOSIS — E669 Obesity, unspecified: Secondary | ICD-10-CM | POA: Diagnosis not present

## 2019-04-06 DIAGNOSIS — M25552 Pain in left hip: Secondary | ICD-10-CM | POA: Diagnosis not present

## 2019-04-06 DIAGNOSIS — M533 Sacrococcygeal disorders, not elsewhere classified: Secondary | ICD-10-CM | POA: Diagnosis not present

## 2019-04-06 DIAGNOSIS — G5712 Meralgia paresthetica, left lower limb: Secondary | ICD-10-CM | POA: Diagnosis not present

## 2019-04-06 DIAGNOSIS — M25551 Pain in right hip: Secondary | ICD-10-CM | POA: Diagnosis not present

## 2019-04-19 DIAGNOSIS — M533 Sacrococcygeal disorders, not elsewhere classified: Secondary | ICD-10-CM | POA: Diagnosis not present

## 2019-04-19 DIAGNOSIS — G5712 Meralgia paresthetica, left lower limb: Secondary | ICD-10-CM | POA: Diagnosis not present

## 2019-04-19 DIAGNOSIS — M25551 Pain in right hip: Secondary | ICD-10-CM | POA: Diagnosis not present

## 2019-04-19 DIAGNOSIS — M25552 Pain in left hip: Secondary | ICD-10-CM | POA: Diagnosis not present

## 2019-05-25 DIAGNOSIS — Z6841 Body Mass Index (BMI) 40.0 and over, adult: Secondary | ICD-10-CM | POA: Diagnosis not present

## 2019-05-25 DIAGNOSIS — R809 Proteinuria, unspecified: Secondary | ICD-10-CM | POA: Diagnosis not present

## 2019-05-25 DIAGNOSIS — I10 Essential (primary) hypertension: Secondary | ICD-10-CM | POA: Diagnosis not present

## 2019-05-25 DIAGNOSIS — E1129 Type 2 diabetes mellitus with other diabetic kidney complication: Secondary | ICD-10-CM | POA: Diagnosis not present

## 2019-05-25 DIAGNOSIS — Z794 Long term (current) use of insulin: Secondary | ICD-10-CM | POA: Diagnosis not present

## 2019-05-25 DIAGNOSIS — E782 Mixed hyperlipidemia: Secondary | ICD-10-CM | POA: Diagnosis not present

## 2019-05-25 DIAGNOSIS — R110 Nausea: Secondary | ICD-10-CM | POA: Diagnosis not present

## 2019-05-28 ENCOUNTER — Other Ambulatory Visit: Payer: Self-pay | Admitting: Cardiology

## 2019-06-18 DIAGNOSIS — Z5181 Encounter for therapeutic drug level monitoring: Secondary | ICD-10-CM | POA: Diagnosis not present

## 2019-06-18 DIAGNOSIS — M25551 Pain in right hip: Secondary | ICD-10-CM | POA: Diagnosis not present

## 2019-06-18 DIAGNOSIS — G5712 Meralgia paresthetica, left lower limb: Secondary | ICD-10-CM | POA: Diagnosis not present

## 2019-06-18 DIAGNOSIS — Z79899 Other long term (current) drug therapy: Secondary | ICD-10-CM | POA: Diagnosis not present

## 2019-06-18 DIAGNOSIS — M533 Sacrococcygeal disorders, not elsewhere classified: Secondary | ICD-10-CM | POA: Diagnosis not present

## 2019-06-18 DIAGNOSIS — G894 Chronic pain syndrome: Secondary | ICD-10-CM | POA: Diagnosis not present

## 2019-06-26 DIAGNOSIS — H1713 Central corneal opacity, bilateral: Secondary | ICD-10-CM | POA: Diagnosis not present

## 2019-06-26 DIAGNOSIS — E119 Type 2 diabetes mellitus without complications: Secondary | ICD-10-CM | POA: Diagnosis not present

## 2019-06-26 DIAGNOSIS — H40033 Anatomical narrow angle, bilateral: Secondary | ICD-10-CM | POA: Diagnosis not present

## 2019-07-03 DIAGNOSIS — I10 Essential (primary) hypertension: Secondary | ICD-10-CM | POA: Diagnosis not present

## 2019-08-14 DIAGNOSIS — M533 Sacrococcygeal disorders, not elsewhere classified: Secondary | ICD-10-CM | POA: Diagnosis not present

## 2019-08-14 DIAGNOSIS — G894 Chronic pain syndrome: Secondary | ICD-10-CM | POA: Diagnosis not present

## 2019-08-14 DIAGNOSIS — M25551 Pain in right hip: Secondary | ICD-10-CM | POA: Diagnosis not present

## 2019-08-14 DIAGNOSIS — G5712 Meralgia paresthetica, left lower limb: Secondary | ICD-10-CM | POA: Diagnosis not present

## 2019-08-20 DIAGNOSIS — F329 Major depressive disorder, single episode, unspecified: Secondary | ICD-10-CM | POA: Diagnosis not present

## 2019-09-06 ENCOUNTER — Other Ambulatory Visit: Payer: Self-pay | Admitting: Cardiology

## 2019-10-09 DIAGNOSIS — M25551 Pain in right hip: Secondary | ICD-10-CM | POA: Diagnosis not present

## 2019-10-09 DIAGNOSIS — M533 Sacrococcygeal disorders, not elsewhere classified: Secondary | ICD-10-CM | POA: Diagnosis not present

## 2019-10-09 DIAGNOSIS — G5712 Meralgia paresthetica, left lower limb: Secondary | ICD-10-CM | POA: Diagnosis not present

## 2019-10-09 DIAGNOSIS — G894 Chronic pain syndrome: Secondary | ICD-10-CM | POA: Diagnosis not present

## 2019-10-15 DIAGNOSIS — F410 Panic disorder [episodic paroxysmal anxiety] without agoraphobia: Secondary | ICD-10-CM | POA: Diagnosis not present

## 2019-11-19 DIAGNOSIS — F329 Major depressive disorder, single episode, unspecified: Secondary | ICD-10-CM | POA: Diagnosis not present

## 2019-12-07 HISTORY — PX: SPINAL CORD STIMULATOR IMPLANT: SHX2422

## 2019-12-20 ENCOUNTER — Other Ambulatory Visit: Payer: Self-pay | Admitting: Cardiology

## 2020-01-07 DIAGNOSIS — R809 Proteinuria, unspecified: Secondary | ICD-10-CM | POA: Diagnosis not present

## 2020-01-07 DIAGNOSIS — Z6841 Body Mass Index (BMI) 40.0 and over, adult: Secondary | ICD-10-CM | POA: Diagnosis not present

## 2020-01-07 DIAGNOSIS — Z794 Long term (current) use of insulin: Secondary | ICD-10-CM | POA: Diagnosis not present

## 2020-01-07 DIAGNOSIS — E781 Pure hyperglyceridemia: Secondary | ICD-10-CM | POA: Diagnosis not present

## 2020-01-07 DIAGNOSIS — E1129 Type 2 diabetes mellitus with other diabetic kidney complication: Secondary | ICD-10-CM | POA: Diagnosis not present

## 2020-01-07 DIAGNOSIS — G894 Chronic pain syndrome: Secondary | ICD-10-CM | POA: Diagnosis not present

## 2020-01-07 DIAGNOSIS — Z1321 Encounter for screening for nutritional disorder: Secondary | ICD-10-CM | POA: Diagnosis not present

## 2020-01-07 DIAGNOSIS — G4720 Circadian rhythm sleep disorder, unspecified type: Secondary | ICD-10-CM | POA: Diagnosis not present

## 2020-01-09 DIAGNOSIS — M533 Sacrococcygeal disorders, not elsewhere classified: Secondary | ICD-10-CM | POA: Diagnosis not present

## 2020-01-09 DIAGNOSIS — G894 Chronic pain syndrome: Secondary | ICD-10-CM | POA: Diagnosis not present

## 2020-01-09 DIAGNOSIS — M25551 Pain in right hip: Secondary | ICD-10-CM | POA: Diagnosis not present

## 2020-01-09 DIAGNOSIS — G5712 Meralgia paresthetica, left lower limb: Secondary | ICD-10-CM | POA: Diagnosis not present

## 2020-01-14 DIAGNOSIS — F329 Major depressive disorder, single episode, unspecified: Secondary | ICD-10-CM | POA: Diagnosis not present

## 2020-01-17 DIAGNOSIS — Z79899 Other long term (current) drug therapy: Secondary | ICD-10-CM | POA: Diagnosis not present

## 2020-01-17 DIAGNOSIS — Z5181 Encounter for therapeutic drug level monitoring: Secondary | ICD-10-CM | POA: Diagnosis not present

## 2020-03-05 DIAGNOSIS — G894 Chronic pain syndrome: Secondary | ICD-10-CM | POA: Diagnosis not present

## 2020-03-05 DIAGNOSIS — G5712 Meralgia paresthetica, left lower limb: Secondary | ICD-10-CM | POA: Diagnosis not present

## 2020-03-05 DIAGNOSIS — M25551 Pain in right hip: Secondary | ICD-10-CM | POA: Diagnosis not present

## 2020-03-05 DIAGNOSIS — M533 Sacrococcygeal disorders, not elsewhere classified: Secondary | ICD-10-CM | POA: Diagnosis not present

## 2020-03-10 DIAGNOSIS — F329 Major depressive disorder, single episode, unspecified: Secondary | ICD-10-CM | POA: Diagnosis not present

## 2020-04-08 DIAGNOSIS — F329 Major depressive disorder, single episode, unspecified: Secondary | ICD-10-CM | POA: Diagnosis not present

## 2020-05-06 DIAGNOSIS — G894 Chronic pain syndrome: Secondary | ICD-10-CM | POA: Diagnosis not present

## 2020-05-06 DIAGNOSIS — M533 Sacrococcygeal disorders, not elsewhere classified: Secondary | ICD-10-CM | POA: Diagnosis not present

## 2020-05-06 DIAGNOSIS — M25551 Pain in right hip: Secondary | ICD-10-CM | POA: Diagnosis not present

## 2020-05-06 DIAGNOSIS — G5712 Meralgia paresthetica, left lower limb: Secondary | ICD-10-CM | POA: Diagnosis not present

## 2020-05-08 DIAGNOSIS — F329 Major depressive disorder, single episode, unspecified: Secondary | ICD-10-CM | POA: Diagnosis not present

## 2020-06-10 DIAGNOSIS — F329 Major depressive disorder, single episode, unspecified: Secondary | ICD-10-CM | POA: Diagnosis not present

## 2020-06-30 DIAGNOSIS — G894 Chronic pain syndrome: Secondary | ICD-10-CM | POA: Diagnosis not present

## 2020-06-30 DIAGNOSIS — M25551 Pain in right hip: Secondary | ICD-10-CM | POA: Diagnosis not present

## 2020-06-30 DIAGNOSIS — M533 Sacrococcygeal disorders, not elsewhere classified: Secondary | ICD-10-CM | POA: Diagnosis not present

## 2020-06-30 DIAGNOSIS — G5712 Meralgia paresthetica, left lower limb: Secondary | ICD-10-CM | POA: Diagnosis not present

## 2020-07-02 DIAGNOSIS — Z794 Long term (current) use of insulin: Secondary | ICD-10-CM | POA: Diagnosis not present

## 2020-07-02 DIAGNOSIS — R809 Proteinuria, unspecified: Secondary | ICD-10-CM | POA: Diagnosis not present

## 2020-07-02 DIAGNOSIS — E1129 Type 2 diabetes mellitus with other diabetic kidney complication: Secondary | ICD-10-CM | POA: Diagnosis not present

## 2020-07-02 DIAGNOSIS — Z6841 Body Mass Index (BMI) 40.0 and over, adult: Secondary | ICD-10-CM | POA: Diagnosis not present

## 2020-07-02 DIAGNOSIS — E782 Mixed hyperlipidemia: Secondary | ICD-10-CM | POA: Diagnosis not present

## 2020-07-02 DIAGNOSIS — I10 Essential (primary) hypertension: Secondary | ICD-10-CM | POA: Diagnosis not present

## 2020-07-28 DIAGNOSIS — F329 Major depressive disorder, single episode, unspecified: Secondary | ICD-10-CM | POA: Diagnosis not present

## 2020-08-14 DIAGNOSIS — G894 Chronic pain syndrome: Secondary | ICD-10-CM | POA: Diagnosis not present

## 2020-08-14 DIAGNOSIS — Z5181 Encounter for therapeutic drug level monitoring: Secondary | ICD-10-CM | POA: Diagnosis not present

## 2020-08-14 DIAGNOSIS — G5712 Meralgia paresthetica, left lower limb: Secondary | ICD-10-CM | POA: Diagnosis not present

## 2020-08-14 DIAGNOSIS — Z79899 Other long term (current) drug therapy: Secondary | ICD-10-CM | POA: Diagnosis not present

## 2020-08-14 DIAGNOSIS — M25551 Pain in right hip: Secondary | ICD-10-CM | POA: Diagnosis not present

## 2020-08-14 DIAGNOSIS — M533 Sacrococcygeal disorders, not elsewhere classified: Secondary | ICD-10-CM | POA: Diagnosis not present

## 2020-09-04 DIAGNOSIS — F329 Major depressive disorder, single episode, unspecified: Secondary | ICD-10-CM | POA: Diagnosis not present

## 2020-10-01 DIAGNOSIS — G5712 Meralgia paresthetica, left lower limb: Secondary | ICD-10-CM | POA: Diagnosis not present

## 2020-10-01 DIAGNOSIS — G894 Chronic pain syndrome: Secondary | ICD-10-CM | POA: Diagnosis not present

## 2020-10-01 DIAGNOSIS — M5417 Radiculopathy, lumbosacral region: Secondary | ICD-10-CM | POA: Diagnosis not present

## 2020-10-01 DIAGNOSIS — M549 Dorsalgia, unspecified: Secondary | ICD-10-CM | POA: Diagnosis not present

## 2020-10-01 DIAGNOSIS — M533 Sacrococcygeal disorders, not elsewhere classified: Secondary | ICD-10-CM | POA: Diagnosis not present

## 2020-10-01 DIAGNOSIS — M25551 Pain in right hip: Secondary | ICD-10-CM | POA: Diagnosis not present

## 2020-10-02 DIAGNOSIS — R809 Proteinuria, unspecified: Secondary | ICD-10-CM | POA: Diagnosis not present

## 2020-10-02 DIAGNOSIS — E1129 Type 2 diabetes mellitus with other diabetic kidney complication: Secondary | ICD-10-CM | POA: Diagnosis not present

## 2020-10-02 DIAGNOSIS — E782 Mixed hyperlipidemia: Secondary | ICD-10-CM | POA: Diagnosis not present

## 2020-10-02 DIAGNOSIS — Z794 Long term (current) use of insulin: Secondary | ICD-10-CM | POA: Diagnosis not present

## 2020-10-02 DIAGNOSIS — E781 Pure hyperglyceridemia: Secondary | ICD-10-CM | POA: Diagnosis not present

## 2020-10-02 DIAGNOSIS — I10 Essential (primary) hypertension: Secondary | ICD-10-CM | POA: Diagnosis not present

## 2020-10-21 DIAGNOSIS — F4310 Post-traumatic stress disorder, unspecified: Secondary | ICD-10-CM | POA: Diagnosis not present

## 2020-10-22 DIAGNOSIS — Z6841 Body Mass Index (BMI) 40.0 and over, adult: Secondary | ICD-10-CM | POA: Diagnosis not present

## 2020-10-22 DIAGNOSIS — M5441 Lumbago with sciatica, right side: Secondary | ICD-10-CM | POA: Diagnosis not present

## 2020-10-22 DIAGNOSIS — Z01818 Encounter for other preprocedural examination: Secondary | ICD-10-CM | POA: Diagnosis not present

## 2020-10-22 DIAGNOSIS — G8929 Other chronic pain: Secondary | ICD-10-CM | POA: Diagnosis not present

## 2020-10-22 DIAGNOSIS — Z9689 Presence of other specified functional implants: Secondary | ICD-10-CM | POA: Diagnosis not present

## 2020-10-22 DIAGNOSIS — M5442 Lumbago with sciatica, left side: Secondary | ICD-10-CM | POA: Diagnosis not present

## 2020-10-22 DIAGNOSIS — G5712 Meralgia paresthetica, left lower limb: Secondary | ICD-10-CM | POA: Diagnosis not present

## 2020-10-22 DIAGNOSIS — M533 Sacrococcygeal disorders, not elsewhere classified: Secondary | ICD-10-CM | POA: Diagnosis not present

## 2020-10-22 DIAGNOSIS — Z9682 Presence of neurostimulator: Secondary | ICD-10-CM | POA: Diagnosis not present

## 2020-11-05 DIAGNOSIS — G894 Chronic pain syndrome: Secondary | ICD-10-CM | POA: Diagnosis not present

## 2020-11-05 DIAGNOSIS — G5712 Meralgia paresthetica, left lower limb: Secondary | ICD-10-CM | POA: Diagnosis not present

## 2020-11-05 DIAGNOSIS — M25551 Pain in right hip: Secondary | ICD-10-CM | POA: Diagnosis not present

## 2020-11-05 DIAGNOSIS — M5417 Radiculopathy, lumbosacral region: Secondary | ICD-10-CM | POA: Diagnosis not present

## 2020-11-13 DIAGNOSIS — M533 Sacrococcygeal disorders, not elsewhere classified: Secondary | ICD-10-CM | POA: Diagnosis not present

## 2020-11-13 DIAGNOSIS — M549 Dorsalgia, unspecified: Secondary | ICD-10-CM | POA: Diagnosis not present

## 2020-11-13 DIAGNOSIS — G894 Chronic pain syndrome: Secondary | ICD-10-CM | POA: Diagnosis not present

## 2020-11-13 DIAGNOSIS — Z01812 Encounter for preprocedural laboratory examination: Secondary | ICD-10-CM | POA: Diagnosis not present

## 2020-11-13 DIAGNOSIS — E119 Type 2 diabetes mellitus without complications: Secondary | ICD-10-CM | POA: Diagnosis not present

## 2020-11-13 DIAGNOSIS — Z01810 Encounter for preprocedural cardiovascular examination: Secondary | ICD-10-CM | POA: Diagnosis not present

## 2020-11-13 DIAGNOSIS — M5417 Radiculopathy, lumbosacral region: Secondary | ICD-10-CM | POA: Diagnosis not present

## 2020-11-13 DIAGNOSIS — I10 Essential (primary) hypertension: Secondary | ICD-10-CM | POA: Diagnosis not present

## 2020-11-13 DIAGNOSIS — Z01818 Encounter for other preprocedural examination: Secondary | ICD-10-CM | POA: Diagnosis not present

## 2020-11-13 DIAGNOSIS — Z794 Long term (current) use of insulin: Secondary | ICD-10-CM | POA: Diagnosis not present

## 2020-11-13 DIAGNOSIS — F419 Anxiety disorder, unspecified: Secondary | ICD-10-CM | POA: Diagnosis not present

## 2020-11-13 DIAGNOSIS — E669 Obesity, unspecified: Secondary | ICD-10-CM | POA: Diagnosis not present

## 2020-11-27 DIAGNOSIS — T85193A Other mechanical complication of implanted electronic neurostimulator, generator, initial encounter: Secondary | ICD-10-CM | POA: Diagnosis not present

## 2020-11-27 DIAGNOSIS — M5442 Lumbago with sciatica, left side: Secondary | ICD-10-CM | POA: Diagnosis not present

## 2020-11-27 DIAGNOSIS — E119 Type 2 diabetes mellitus without complications: Secondary | ICD-10-CM | POA: Diagnosis not present

## 2020-11-27 DIAGNOSIS — E785 Hyperlipidemia, unspecified: Secondary | ICD-10-CM | POA: Diagnosis not present

## 2020-11-27 DIAGNOSIS — Z79899 Other long term (current) drug therapy: Secondary | ICD-10-CM | POA: Diagnosis not present

## 2020-11-27 DIAGNOSIS — Z9049 Acquired absence of other specified parts of digestive tract: Secondary | ICD-10-CM | POA: Diagnosis not present

## 2020-11-27 DIAGNOSIS — I10 Essential (primary) hypertension: Secondary | ICD-10-CM | POA: Diagnosis not present

## 2020-11-27 DIAGNOSIS — T85610A Breakdown (mechanical) of epidural and subdural infusion catheter, initial encounter: Secondary | ICD-10-CM | POA: Diagnosis not present

## 2020-11-27 DIAGNOSIS — K219 Gastro-esophageal reflux disease without esophagitis: Secondary | ICD-10-CM | POA: Diagnosis not present

## 2020-11-27 DIAGNOSIS — G5710 Meralgia paresthetica, unspecified lower limb: Secondary | ICD-10-CM | POA: Diagnosis not present

## 2020-11-27 DIAGNOSIS — F329 Major depressive disorder, single episode, unspecified: Secondary | ICD-10-CM | POA: Diagnosis not present

## 2020-11-27 DIAGNOSIS — N189 Chronic kidney disease, unspecified: Secondary | ICD-10-CM | POA: Diagnosis not present

## 2020-11-27 DIAGNOSIS — E1165 Type 2 diabetes mellitus with hyperglycemia: Secondary | ICD-10-CM | POA: Diagnosis not present

## 2020-11-27 DIAGNOSIS — Z8711 Personal history of peptic ulcer disease: Secondary | ICD-10-CM | POA: Diagnosis not present

## 2020-11-27 DIAGNOSIS — M5441 Lumbago with sciatica, right side: Secondary | ICD-10-CM | POA: Diagnosis not present

## 2020-11-27 DIAGNOSIS — M797 Fibromyalgia: Secondary | ICD-10-CM | POA: Diagnosis not present

## 2020-11-27 DIAGNOSIS — Z9689 Presence of other specified functional implants: Secondary | ICD-10-CM | POA: Diagnosis not present

## 2020-11-27 DIAGNOSIS — D631 Anemia in chronic kidney disease: Secondary | ICD-10-CM | POA: Diagnosis not present

## 2020-11-27 DIAGNOSIS — E1122 Type 2 diabetes mellitus with diabetic chronic kidney disease: Secondary | ICD-10-CM | POA: Diagnosis not present

## 2020-11-27 DIAGNOSIS — G8929 Other chronic pain: Secondary | ICD-10-CM | POA: Diagnosis not present

## 2020-11-27 DIAGNOSIS — M545 Low back pain, unspecified: Secondary | ICD-10-CM | POA: Diagnosis not present

## 2020-11-27 DIAGNOSIS — T85113A Breakdown (mechanical) of implanted electronic neurostimulator, generator, initial encounter: Secondary | ICD-10-CM | POA: Diagnosis not present

## 2020-11-27 DIAGNOSIS — Z794 Long term (current) use of insulin: Secondary | ICD-10-CM | POA: Diagnosis not present

## 2020-11-27 DIAGNOSIS — I129 Hypertensive chronic kidney disease with stage 1 through stage 4 chronic kidney disease, or unspecified chronic kidney disease: Secondary | ICD-10-CM | POA: Diagnosis not present

## 2020-11-27 DIAGNOSIS — F419 Anxiety disorder, unspecified: Secondary | ICD-10-CM | POA: Diagnosis not present

## 2020-11-27 DIAGNOSIS — Z6841 Body Mass Index (BMI) 40.0 and over, adult: Secondary | ICD-10-CM | POA: Diagnosis not present

## 2020-11-27 DIAGNOSIS — J45909 Unspecified asthma, uncomplicated: Secondary | ICD-10-CM | POA: Diagnosis not present

## 2020-12-12 DIAGNOSIS — F329 Major depressive disorder, single episode, unspecified: Secondary | ICD-10-CM | POA: Diagnosis not present

## 2020-12-24 DIAGNOSIS — M25552 Pain in left hip: Secondary | ICD-10-CM | POA: Diagnosis not present

## 2020-12-24 DIAGNOSIS — G5712 Meralgia paresthetica, left lower limb: Secondary | ICD-10-CM | POA: Diagnosis not present

## 2020-12-24 DIAGNOSIS — M7062 Trochanteric bursitis, left hip: Secondary | ICD-10-CM | POA: Diagnosis not present

## 2020-12-24 DIAGNOSIS — M7061 Trochanteric bursitis, right hip: Secondary | ICD-10-CM | POA: Diagnosis not present

## 2020-12-24 DIAGNOSIS — M549 Dorsalgia, unspecified: Secondary | ICD-10-CM | POA: Diagnosis not present

## 2020-12-24 DIAGNOSIS — M5417 Radiculopathy, lumbosacral region: Secondary | ICD-10-CM | POA: Diagnosis not present

## 2020-12-24 DIAGNOSIS — M25551 Pain in right hip: Secondary | ICD-10-CM | POA: Diagnosis not present

## 2020-12-24 DIAGNOSIS — M533 Sacrococcygeal disorders, not elsewhere classified: Secondary | ICD-10-CM | POA: Diagnosis not present

## 2020-12-24 DIAGNOSIS — G894 Chronic pain syndrome: Secondary | ICD-10-CM | POA: Diagnosis not present

## 2021-02-04 DIAGNOSIS — E1129 Type 2 diabetes mellitus with other diabetic kidney complication: Secondary | ICD-10-CM | POA: Diagnosis not present

## 2021-02-04 DIAGNOSIS — Z794 Long term (current) use of insulin: Secondary | ICD-10-CM | POA: Diagnosis not present

## 2021-02-04 DIAGNOSIS — R809 Proteinuria, unspecified: Secondary | ICD-10-CM | POA: Diagnosis not present

## 2021-02-04 DIAGNOSIS — I10 Essential (primary) hypertension: Secondary | ICD-10-CM | POA: Diagnosis not present

## 2021-02-09 DIAGNOSIS — R110 Nausea: Secondary | ICD-10-CM | POA: Diagnosis not present

## 2021-02-09 DIAGNOSIS — Z794 Long term (current) use of insulin: Secondary | ICD-10-CM | POA: Diagnosis not present

## 2021-02-09 DIAGNOSIS — E1129 Type 2 diabetes mellitus with other diabetic kidney complication: Secondary | ICD-10-CM | POA: Diagnosis not present

## 2021-02-09 DIAGNOSIS — R809 Proteinuria, unspecified: Secondary | ICD-10-CM | POA: Diagnosis not present

## 2021-02-09 DIAGNOSIS — I10 Essential (primary) hypertension: Secondary | ICD-10-CM | POA: Diagnosis not present

## 2021-02-09 DIAGNOSIS — Z6841 Body Mass Index (BMI) 40.0 and over, adult: Secondary | ICD-10-CM | POA: Diagnosis not present

## 2021-02-09 DIAGNOSIS — E782 Mixed hyperlipidemia: Secondary | ICD-10-CM | POA: Diagnosis not present

## 2021-02-10 DIAGNOSIS — R1110 Vomiting, unspecified: Secondary | ICD-10-CM | POA: Diagnosis not present

## 2021-02-23 DIAGNOSIS — R140 Abdominal distension (gaseous): Secondary | ICD-10-CM | POA: Diagnosis not present

## 2021-02-23 DIAGNOSIS — R1110 Vomiting, unspecified: Secondary | ICD-10-CM | POA: Diagnosis not present

## 2021-02-27 DIAGNOSIS — R1110 Vomiting, unspecified: Secondary | ICD-10-CM | POA: Diagnosis not present

## 2021-03-06 DIAGNOSIS — F329 Major depressive disorder, single episode, unspecified: Secondary | ICD-10-CM | POA: Diagnosis not present

## 2021-03-11 DIAGNOSIS — K2960 Other gastritis without bleeding: Secondary | ICD-10-CM | POA: Diagnosis not present

## 2021-03-11 DIAGNOSIS — K219 Gastro-esophageal reflux disease without esophagitis: Secondary | ICD-10-CM | POA: Diagnosis not present

## 2021-03-11 DIAGNOSIS — K2950 Unspecified chronic gastritis without bleeding: Secondary | ICD-10-CM | POA: Diagnosis not present

## 2021-03-11 DIAGNOSIS — R110 Nausea: Secondary | ICD-10-CM | POA: Diagnosis not present

## 2021-03-19 DIAGNOSIS — G5712 Meralgia paresthetica, left lower limb: Secondary | ICD-10-CM | POA: Diagnosis not present

## 2021-03-19 DIAGNOSIS — M7061 Trochanteric bursitis, right hip: Secondary | ICD-10-CM | POA: Diagnosis not present

## 2021-03-19 DIAGNOSIS — G8929 Other chronic pain: Secondary | ICD-10-CM | POA: Diagnosis not present

## 2021-03-19 DIAGNOSIS — M25552 Pain in left hip: Secondary | ICD-10-CM | POA: Diagnosis not present

## 2021-03-19 DIAGNOSIS — M533 Sacrococcygeal disorders, not elsewhere classified: Secondary | ICD-10-CM | POA: Diagnosis not present

## 2021-03-19 DIAGNOSIS — M7062 Trochanteric bursitis, left hip: Secondary | ICD-10-CM | POA: Diagnosis not present

## 2021-03-19 DIAGNOSIS — M549 Dorsalgia, unspecified: Secondary | ICD-10-CM | POA: Diagnosis not present

## 2021-03-19 DIAGNOSIS — G894 Chronic pain syndrome: Secondary | ICD-10-CM | POA: Diagnosis not present

## 2021-03-19 DIAGNOSIS — M545 Low back pain, unspecified: Secondary | ICD-10-CM | POA: Diagnosis not present

## 2021-03-19 DIAGNOSIS — Z5181 Encounter for therapeutic drug level monitoring: Secondary | ICD-10-CM | POA: Diagnosis not present

## 2021-03-19 DIAGNOSIS — M25551 Pain in right hip: Secondary | ICD-10-CM | POA: Diagnosis not present

## 2021-03-19 DIAGNOSIS — Z79899 Other long term (current) drug therapy: Secondary | ICD-10-CM | POA: Diagnosis not present

## 2021-03-19 DIAGNOSIS — M5417 Radiculopathy, lumbosacral region: Secondary | ICD-10-CM | POA: Diagnosis not present

## 2021-03-19 DIAGNOSIS — Z9689 Presence of other specified functional implants: Secondary | ICD-10-CM | POA: Diagnosis not present

## 2021-04-01 DIAGNOSIS — H40033 Anatomical narrow angle, bilateral: Secondary | ICD-10-CM | POA: Diagnosis not present

## 2021-04-01 DIAGNOSIS — H1713 Central corneal opacity, bilateral: Secondary | ICD-10-CM | POA: Diagnosis not present

## 2021-04-01 DIAGNOSIS — E119 Type 2 diabetes mellitus without complications: Secondary | ICD-10-CM | POA: Diagnosis not present

## 2021-04-08 DIAGNOSIS — C44212 Basal cell carcinoma of skin of right ear and external auricular canal: Secondary | ICD-10-CM | POA: Diagnosis not present

## 2021-04-08 DIAGNOSIS — L821 Other seborrheic keratosis: Secondary | ICD-10-CM | POA: Diagnosis not present

## 2021-04-08 DIAGNOSIS — C44319 Basal cell carcinoma of skin of other parts of face: Secondary | ICD-10-CM | POA: Diagnosis not present

## 2021-04-08 DIAGNOSIS — C4441 Basal cell carcinoma of skin of scalp and neck: Secondary | ICD-10-CM | POA: Diagnosis not present

## 2021-04-08 DIAGNOSIS — D229 Melanocytic nevi, unspecified: Secondary | ICD-10-CM | POA: Diagnosis not present

## 2021-04-21 DIAGNOSIS — C4441 Basal cell carcinoma of skin of scalp and neck: Secondary | ICD-10-CM | POA: Diagnosis not present

## 2021-06-02 DIAGNOSIS — F329 Major depressive disorder, single episode, unspecified: Secondary | ICD-10-CM | POA: Diagnosis not present

## 2021-06-11 DIAGNOSIS — R110 Nausea: Secondary | ICD-10-CM | POA: Diagnosis not present

## 2021-06-11 DIAGNOSIS — Z794 Long term (current) use of insulin: Secondary | ICD-10-CM | POA: Diagnosis not present

## 2021-06-11 DIAGNOSIS — E782 Mixed hyperlipidemia: Secondary | ICD-10-CM | POA: Diagnosis not present

## 2021-06-11 DIAGNOSIS — I10 Essential (primary) hypertension: Secondary | ICD-10-CM | POA: Diagnosis not present

## 2021-06-11 DIAGNOSIS — E1129 Type 2 diabetes mellitus with other diabetic kidney complication: Secondary | ICD-10-CM | POA: Diagnosis not present

## 2021-06-11 DIAGNOSIS — R809 Proteinuria, unspecified: Secondary | ICD-10-CM | POA: Diagnosis not present

## 2021-06-11 DIAGNOSIS — Z7984 Long term (current) use of oral hypoglycemic drugs: Secondary | ICD-10-CM | POA: Diagnosis not present

## 2021-07-06 DIAGNOSIS — G5710 Meralgia paresthetica, unspecified lower limb: Secondary | ICD-10-CM | POA: Diagnosis not present

## 2021-07-06 DIAGNOSIS — M25552 Pain in left hip: Secondary | ICD-10-CM | POA: Diagnosis not present

## 2021-07-06 DIAGNOSIS — G894 Chronic pain syndrome: Secondary | ICD-10-CM | POA: Diagnosis not present

## 2021-07-06 DIAGNOSIS — G5712 Meralgia paresthetica, left lower limb: Secondary | ICD-10-CM | POA: Diagnosis not present

## 2021-07-06 DIAGNOSIS — M7061 Trochanteric bursitis, right hip: Secondary | ICD-10-CM | POA: Diagnosis not present

## 2021-07-06 DIAGNOSIS — M549 Dorsalgia, unspecified: Secondary | ICD-10-CM | POA: Diagnosis not present

## 2021-07-06 DIAGNOSIS — M25551 Pain in right hip: Secondary | ICD-10-CM | POA: Diagnosis not present

## 2021-07-06 DIAGNOSIS — M5417 Radiculopathy, lumbosacral region: Secondary | ICD-10-CM | POA: Diagnosis not present

## 2021-07-06 DIAGNOSIS — M7062 Trochanteric bursitis, left hip: Secondary | ICD-10-CM | POA: Diagnosis not present

## 2021-07-06 DIAGNOSIS — Z9689 Presence of other specified functional implants: Secondary | ICD-10-CM | POA: Diagnosis not present

## 2021-07-06 DIAGNOSIS — M545 Low back pain, unspecified: Secondary | ICD-10-CM | POA: Diagnosis not present

## 2021-07-06 DIAGNOSIS — M533 Sacrococcygeal disorders, not elsewhere classified: Secondary | ICD-10-CM | POA: Diagnosis not present

## 2021-08-05 DIAGNOSIS — R809 Proteinuria, unspecified: Secondary | ICD-10-CM | POA: Diagnosis not present

## 2021-08-05 DIAGNOSIS — K3184 Gastroparesis: Secondary | ICD-10-CM | POA: Diagnosis not present

## 2021-08-05 DIAGNOSIS — I10 Essential (primary) hypertension: Secondary | ICD-10-CM | POA: Diagnosis not present

## 2021-08-05 DIAGNOSIS — Z794 Long term (current) use of insulin: Secondary | ICD-10-CM | POA: Diagnosis not present

## 2021-08-05 DIAGNOSIS — E1143 Type 2 diabetes mellitus with diabetic autonomic (poly)neuropathy: Secondary | ICD-10-CM | POA: Diagnosis not present

## 2021-08-05 DIAGNOSIS — E1129 Type 2 diabetes mellitus with other diabetic kidney complication: Secondary | ICD-10-CM | POA: Diagnosis not present

## 2021-08-05 DIAGNOSIS — R112 Nausea with vomiting, unspecified: Secondary | ICD-10-CM | POA: Diagnosis not present

## 2021-09-11 DIAGNOSIS — Z1321 Encounter for screening for nutritional disorder: Secondary | ICD-10-CM | POA: Diagnosis not present

## 2021-09-11 DIAGNOSIS — Z794 Long term (current) use of insulin: Secondary | ICD-10-CM | POA: Diagnosis not present

## 2021-09-11 DIAGNOSIS — E1129 Type 2 diabetes mellitus with other diabetic kidney complication: Secondary | ICD-10-CM | POA: Diagnosis not present

## 2021-09-11 DIAGNOSIS — E782 Mixed hyperlipidemia: Secondary | ICD-10-CM | POA: Diagnosis not present

## 2021-09-11 DIAGNOSIS — R809 Proteinuria, unspecified: Secondary | ICD-10-CM | POA: Diagnosis not present

## 2021-09-11 DIAGNOSIS — I10 Essential (primary) hypertension: Secondary | ICD-10-CM | POA: Diagnosis not present

## 2021-09-16 DIAGNOSIS — E1143 Type 2 diabetes mellitus with diabetic autonomic (poly)neuropathy: Secondary | ICD-10-CM | POA: Diagnosis not present

## 2021-09-16 DIAGNOSIS — K3184 Gastroparesis: Secondary | ICD-10-CM | POA: Diagnosis not present

## 2021-09-16 DIAGNOSIS — I10 Essential (primary) hypertension: Secondary | ICD-10-CM | POA: Diagnosis not present

## 2021-10-02 DIAGNOSIS — G894 Chronic pain syndrome: Secondary | ICD-10-CM | POA: Diagnosis not present

## 2021-10-02 DIAGNOSIS — M25551 Pain in right hip: Secondary | ICD-10-CM | POA: Diagnosis not present

## 2021-10-02 DIAGNOSIS — M5417 Radiculopathy, lumbosacral region: Secondary | ICD-10-CM | POA: Diagnosis not present

## 2021-10-02 DIAGNOSIS — M7061 Trochanteric bursitis, right hip: Secondary | ICD-10-CM | POA: Diagnosis not present

## 2021-10-02 DIAGNOSIS — M7062 Trochanteric bursitis, left hip: Secondary | ICD-10-CM | POA: Diagnosis not present

## 2021-10-02 DIAGNOSIS — M533 Sacrococcygeal disorders, not elsewhere classified: Secondary | ICD-10-CM | POA: Diagnosis not present

## 2021-10-02 DIAGNOSIS — M25552 Pain in left hip: Secondary | ICD-10-CM | POA: Diagnosis not present

## 2021-10-02 DIAGNOSIS — M545 Low back pain, unspecified: Secondary | ICD-10-CM | POA: Diagnosis not present

## 2021-10-02 DIAGNOSIS — G5712 Meralgia paresthetica, left lower limb: Secondary | ICD-10-CM | POA: Diagnosis not present

## 2021-10-02 DIAGNOSIS — Z9689 Presence of other specified functional implants: Secondary | ICD-10-CM | POA: Diagnosis not present

## 2021-10-02 DIAGNOSIS — Z5181 Encounter for therapeutic drug level monitoring: Secondary | ICD-10-CM | POA: Diagnosis not present

## 2021-10-02 DIAGNOSIS — Z79899 Other long term (current) drug therapy: Secondary | ICD-10-CM | POA: Diagnosis not present

## 2021-10-02 DIAGNOSIS — G8929 Other chronic pain: Secondary | ICD-10-CM | POA: Diagnosis not present

## 2021-10-02 DIAGNOSIS — M549 Dorsalgia, unspecified: Secondary | ICD-10-CM | POA: Diagnosis not present

## 2021-10-07 DIAGNOSIS — E119 Type 2 diabetes mellitus without complications: Secondary | ICD-10-CM | POA: Diagnosis not present

## 2021-10-07 DIAGNOSIS — H1713 Central corneal opacity, bilateral: Secondary | ICD-10-CM | POA: Diagnosis not present

## 2021-10-07 DIAGNOSIS — H40033 Anatomical narrow angle, bilateral: Secondary | ICD-10-CM | POA: Diagnosis not present

## 2021-10-08 DIAGNOSIS — Z7984 Long term (current) use of oral hypoglycemic drugs: Secondary | ICD-10-CM | POA: Diagnosis not present

## 2021-10-08 DIAGNOSIS — E1129 Type 2 diabetes mellitus with other diabetic kidney complication: Secondary | ICD-10-CM | POA: Diagnosis not present

## 2021-10-08 DIAGNOSIS — E782 Mixed hyperlipidemia: Secondary | ICD-10-CM | POA: Diagnosis not present

## 2021-10-08 DIAGNOSIS — R809 Proteinuria, unspecified: Secondary | ICD-10-CM | POA: Diagnosis not present

## 2021-10-08 DIAGNOSIS — Z7985 Long-term (current) use of injectable non-insulin antidiabetic drugs: Secondary | ICD-10-CM | POA: Diagnosis not present

## 2021-10-08 DIAGNOSIS — I10 Essential (primary) hypertension: Secondary | ICD-10-CM | POA: Diagnosis not present

## 2021-10-16 DIAGNOSIS — H2512 Age-related nuclear cataract, left eye: Secondary | ICD-10-CM | POA: Diagnosis not present

## 2021-10-16 DIAGNOSIS — H2513 Age-related nuclear cataract, bilateral: Secondary | ICD-10-CM | POA: Diagnosis not present

## 2021-10-26 DIAGNOSIS — E119 Type 2 diabetes mellitus without complications: Secondary | ICD-10-CM | POA: Diagnosis not present

## 2021-10-26 DIAGNOSIS — H2513 Age-related nuclear cataract, bilateral: Secondary | ICD-10-CM | POA: Diagnosis not present

## 2021-10-26 DIAGNOSIS — H1789 Other corneal scars and opacities: Secondary | ICD-10-CM | POA: Diagnosis not present

## 2021-12-10 ENCOUNTER — Other Ambulatory Visit: Payer: Self-pay

## 2021-12-10 ENCOUNTER — Emergency Department (HOSPITAL_COMMUNITY)
Admission: EM | Admit: 2021-12-10 | Discharge: 2021-12-11 | Disposition: A | Discharge status: Home / Self Care | Payer: PPO | Source: Home / Self Care

## 2021-12-10 ENCOUNTER — Encounter (HOSPITAL_COMMUNITY): Payer: Self-pay

## 2021-12-10 ENCOUNTER — Emergency Department (HOSPITAL_COMMUNITY): Payer: PPO

## 2021-12-10 DIAGNOSIS — J45909 Unspecified asthma, uncomplicated: Secondary | ICD-10-CM | POA: Diagnosis not present

## 2021-12-10 DIAGNOSIS — E1143 Type 2 diabetes mellitus with diabetic autonomic (poly)neuropathy: Secondary | ICD-10-CM | POA: Diagnosis not present

## 2021-12-10 DIAGNOSIS — M1990 Unspecified osteoarthritis, unspecified site: Secondary | ICD-10-CM | POA: Diagnosis not present

## 2021-12-10 DIAGNOSIS — Z5321 Procedure and treatment not carried out due to patient leaving prior to being seen by health care provider: Secondary | ICD-10-CM | POA: Insufficient documentation

## 2021-12-10 DIAGNOSIS — I129 Hypertensive chronic kidney disease with stage 1 through stage 4 chronic kidney disease, or unspecified chronic kidney disease: Secondary | ICD-10-CM | POA: Diagnosis not present

## 2021-12-10 DIAGNOSIS — F32A Depression, unspecified: Secondary | ICD-10-CM | POA: Diagnosis not present

## 2021-12-10 DIAGNOSIS — N189 Chronic kidney disease, unspecified: Secondary | ICD-10-CM | POA: Diagnosis not present

## 2021-12-10 DIAGNOSIS — Z7984 Long term (current) use of oral hypoglycemic drugs: Secondary | ICD-10-CM | POA: Diagnosis not present

## 2021-12-10 DIAGNOSIS — R109 Unspecified abdominal pain: Secondary | ICD-10-CM | POA: Insufficient documentation

## 2021-12-10 DIAGNOSIS — N200 Calculus of kidney: Secondary | ICD-10-CM | POA: Diagnosis present

## 2021-12-10 DIAGNOSIS — N3020 Other chronic cystitis without hematuria: Secondary | ICD-10-CM | POA: Diagnosis not present

## 2021-12-10 DIAGNOSIS — K3184 Gastroparesis: Secondary | ICD-10-CM | POA: Diagnosis not present

## 2021-12-10 DIAGNOSIS — F419 Anxiety disorder, unspecified: Secondary | ICD-10-CM | POA: Diagnosis not present

## 2021-12-10 DIAGNOSIS — M797 Fibromyalgia: Secondary | ICD-10-CM | POA: Diagnosis not present

## 2021-12-10 HISTORY — DX: Gastroparesis: K31.84

## 2021-12-10 LAB — CBC WITH DIFFERENTIAL/PLATELET
Abs Immature Granulocytes: 0.05 10*3/uL (ref 0.00–0.07)
Basophils Absolute: 0.1 10*3/uL (ref 0.0–0.1)
Basophils Relative: 1 %
Eosinophils Absolute: 0.1 10*3/uL (ref 0.0–0.5)
Eosinophils Relative: 1 %
HCT: 45.9 % (ref 36.0–46.0)
Hemoglobin: 14.7 g/dL (ref 12.0–15.0)
Immature Granulocytes: 0 %
Lymphocytes Relative: 14 %
Lymphs Abs: 2.1 10*3/uL (ref 0.7–4.0)
MCH: 30.4 pg (ref 26.0–34.0)
MCHC: 32 g/dL (ref 30.0–36.0)
MCV: 95 fL (ref 80.0–100.0)
Monocytes Absolute: 1 10*3/uL (ref 0.1–1.0)
Monocytes Relative: 7 %
Neutro Abs: 12 10*3/uL — ABNORMAL HIGH (ref 1.7–7.7)
Neutrophils Relative %: 77 %
Platelets: 339 10*3/uL (ref 150–400)
RBC: 4.83 MIL/uL (ref 3.87–5.11)
RDW: 13.8 % (ref 11.5–15.5)
WBC: 15.4 10*3/uL — ABNORMAL HIGH (ref 4.0–10.5)
nRBC: 0 % (ref 0.0–0.2)

## 2021-12-10 LAB — URINALYSIS, ROUTINE W REFLEX MICROSCOPIC
Bacteria, UA: NONE SEEN
Bilirubin Urine: NEGATIVE
Glucose, UA: >=500 mg/dL — AB
Ketones, ur: NEGATIVE mg/dL
Nitrite: NEGATIVE
Protein, ur: 100 mg/dL — AB
RBC / HPF: >50 RBC/hpf — ABNORMAL HIGH (ref 0–5)
Specific Gravity, Urine: 1.014 (ref 1.005–1.030)
WBC, UA: >50 WBC/hpf — ABNORMAL HIGH (ref 0–5)
pH: 6 (ref 5.0–8.0)

## 2021-12-10 LAB — COMPREHENSIVE METABOLIC PANEL
ALT: 12 U/L (ref 0–44)
AST: 17 U/L (ref 15–41)
Albumin: 4 g/dL (ref 3.5–5.0)
Alkaline Phosphatase: 58 U/L (ref 38–126)
Anion gap: 9 (ref 5–15)
BUN: 15 mg/dL (ref 6–20)
CO2: 29 mmol/L (ref 22–32)
Calcium: 9.8 mg/dL (ref 8.9–10.3)
Chloride: 100 mmol/L (ref 98–111)
Creatinine, Ser: 0.98 mg/dL (ref 0.44–1.00)
GFR, Estimated: >60 mL/min (ref >60)
Glucose, Bld: 166 mg/dL — ABNORMAL HIGH (ref 70–99)
Potassium: 3.5 mmol/L (ref 3.5–5.1)
Sodium: 138 mmol/L (ref 135–145)
Total Bilirubin: 0.5 mg/dL (ref 0.3–1.2)
Total Protein: 7.7 g/dL (ref 6.5–8.1)

## 2021-12-10 LAB — I-STAT BETA HCG BLOOD, ED (MC, WL, AP ONLY): I-stat hCG, quantitative: <5 m[IU]/mL (ref <5)

## 2021-12-10 MED ORDER — MORPHINE SULFATE (PF) 2 MG/ML IV SOLN
2.0000 mg | Freq: Once | INTRAVENOUS | Status: AC
Start: 2021-12-10 — End: 2021-12-10
  Administered 2021-12-10: 2 mg via INTRAMUSCULAR
  Filled 2021-12-10: qty 1

## 2021-12-10 MED ORDER — HYDROCODONE-ACETAMINOPHEN 5-325 MG PO TABS
1.0000 | ORAL_TABLET | Freq: Once | ORAL | Status: AC
  Administered 2021-12-10: 1 via ORAL
  Filled 2021-12-10: qty 1

## 2021-12-10 NOTE — ED Provider Triage Note (Signed)
Emergency Medicine Provider Triage Evaluation Note  Grace Singh , a 56 y.o. female  was evaluated in triage.  Pt complains of bilateral flank pain that occurred abruptly around 6 PM this evening.  Patient does have a history of nephrolithiasis with subsequent sepsis and emergent surgery.  Patient states this feels similar.  No urinary symptoms.  No nausea, no vomiting.  Review of Systems  Positive:  Negative: See above   Physical Exam  BP (!) 139/95 (BP Location: Left Arm)    Pulse 88    Temp 98.4 F (36.9 C) (Oral)    Resp 18    Ht 5\' 6"  (1.676 m)    Wt 102.5 kg    LMP 03/07/2014    SpO2 100%    BMI 36.48 kg/m  Gen:   Awake, no distress   Resp:  Normal effort  MSK:   Moves extremities without difficulty  Other:  Bilateral CVA tenderness  Medical Decision Making  Medically screening exam initiated at 10:03 PM.  Appropriate orders placed.  Grace Singh was informed that the remainder of the evaluation will be completed by another provider, this initial triage assessment does not replace that evaluation, and the importance of remaining in the ED until their evaluation is complete.     Myna Bright [place], [place] 12/10/21 2205

## 2021-12-10 NOTE — ED Triage Notes (Signed)
Pt reports bilateral flank pain beginning at [facility].

## 2021-12-11 ENCOUNTER — Other Ambulatory Visit: Payer: Self-pay

## 2021-12-11 ENCOUNTER — Ambulatory Visit (HOSPITAL_COMMUNITY): Payer: PPO | Admitting: Anesthesiology

## 2021-12-11 ENCOUNTER — Ambulatory Visit (HOSPITAL_COMMUNITY)
Admission: EM | Admit: 2021-12-11 | Discharge: 2021-12-11 | Disposition: A | Discharge status: Home / Self Care | Payer: PPO | Source: Ambulatory Visit | Attending: Urology | Admitting: Urology

## 2021-12-11 ENCOUNTER — Other Ambulatory Visit: Payer: Self-pay | Admitting: Urology

## 2021-12-11 ENCOUNTER — Encounter (HOSPITAL_COMMUNITY)
Admission: EM | Disposition: A | Discharge status: Home / Self Care | Payer: Self-pay | Source: Ambulatory Visit | Attending: Urology

## 2021-12-11 ENCOUNTER — Ambulatory Visit (HOSPITAL_COMMUNITY): Payer: PPO

## 2021-12-11 ENCOUNTER — Encounter (HOSPITAL_COMMUNITY): Payer: Self-pay | Admitting: Urology

## 2021-12-11 DIAGNOSIS — J45909 Unspecified asthma, uncomplicated: Secondary | ICD-10-CM | POA: Insufficient documentation

## 2021-12-11 DIAGNOSIS — N189 Chronic kidney disease, unspecified: Secondary | ICD-10-CM | POA: Insufficient documentation

## 2021-12-11 DIAGNOSIS — F32A Depression, unspecified: Secondary | ICD-10-CM | POA: Insufficient documentation

## 2021-12-11 DIAGNOSIS — E1143 Type 2 diabetes mellitus with diabetic autonomic (poly)neuropathy: Secondary | ICD-10-CM | POA: Insufficient documentation

## 2021-12-11 DIAGNOSIS — Z7984 Long term (current) use of oral hypoglycemic drugs: Secondary | ICD-10-CM | POA: Insufficient documentation

## 2021-12-11 DIAGNOSIS — F419 Anxiety disorder, unspecified: Secondary | ICD-10-CM | POA: Insufficient documentation

## 2021-12-11 DIAGNOSIS — M797 Fibromyalgia: Secondary | ICD-10-CM | POA: Insufficient documentation

## 2021-12-11 DIAGNOSIS — K3184 Gastroparesis: Secondary | ICD-10-CM | POA: Insufficient documentation

## 2021-12-11 DIAGNOSIS — M1990 Unspecified osteoarthritis, unspecified site: Secondary | ICD-10-CM | POA: Insufficient documentation

## 2021-12-11 DIAGNOSIS — I129 Hypertensive chronic kidney disease with stage 1 through stage 4 chronic kidney disease, or unspecified chronic kidney disease: Secondary | ICD-10-CM | POA: Insufficient documentation

## 2021-12-11 DIAGNOSIS — N200 Calculus of kidney: Secondary | ICD-10-CM | POA: Insufficient documentation

## 2021-12-11 DIAGNOSIS — N3020 Other chronic cystitis without hematuria: Secondary | ICD-10-CM | POA: Diagnosis not present

## 2021-12-11 HISTORY — PX: CYSTOSCOPY WITH URETEROSCOPY AND STENT PLACEMENT: SHX6377

## 2021-12-11 LAB — GLUCOSE, CAPILLARY
Glucose-Capillary: 101 mg/dL — ABNORMAL HIGH (ref 70–99)
Glucose-Capillary: 107 mg/dL — ABNORMAL HIGH (ref 70–99)

## 2021-12-11 SURGERY — CYSTOURETEROSCOPY, WITH STENT INSERTION
Anesthesia: General | Laterality: Bilateral

## 2021-12-11 MED ORDER — OXYCODONE HCL 5 MG PO TABS: 5.0000 mg | ORAL_TABLET | Freq: Once | ORAL | Status: AC | PRN

## 2021-12-11 MED ORDER — DIPHENHYDRAMINE HCL 50 MG/ML IJ SOLN: 25.0000 mg | Freq: Every day | INTRAMUSCULAR | Status: DC | PRN

## 2021-12-11 MED ORDER — LIDOCAINE HCL (CARDIAC) PF 100 MG/5ML IV SOSY
PREFILLED_SYRINGE | INTRAVENOUS | Status: DC | PRN
Start: 2021-12-11 — End: 2021-12-11
  Administered 2021-12-11: 80 mg via INTRAVENOUS

## 2021-12-11 MED ORDER — SODIUM CHLORIDE 0.9 % IV BOLUS FOR AMBISOME: 500.0000 mL | INTRAVENOUS | Status: DC

## 2021-12-11 MED ORDER — MIDAZOLAM HCL 2 MG/2ML IJ SOLN
INTRAMUSCULAR | Status: AC
  Filled 2021-12-11: qty 2

## 2021-12-11 MED ORDER — PROPOFOL 10 MG/ML IV BOLUS
INTRAVENOUS | Status: DC | PRN
Start: 2021-12-11 — End: 2021-12-11
  Administered 2021-12-11: 150 mg via INTRAVENOUS

## 2021-12-11 MED ORDER — FENTANYL CITRATE (PF) 100 MCG/2ML IJ SOLN
INTRAMUSCULAR | Status: DC | PRN
Start: 2021-12-11 — End: 2021-12-11
  Administered 2021-12-11 (×2): 25 ug via INTRAVENOUS
  Administered 2021-12-11: 50 ug via INTRAVENOUS

## 2021-12-11 MED ORDER — OXYCODONE HCL 5 MG PO TABS
ORAL_TABLET | ORAL | Status: AC
  Administered 2021-12-11: 5 mg via ORAL
  Filled 2021-12-11: qty 1

## 2021-12-11 MED ORDER — AMISULPRIDE (ANTIEMETIC) 5 MG/2ML IV SOLN: 10.0000 mg | Freq: Once | INTRAVENOUS | Status: DC | PRN

## 2021-12-11 MED ORDER — DEXTROSE 5 % IV SOLN
5.0000 mg/kg | Freq: Once | INTRAVENOUS | Status: AC
  Administered 2021-12-11: 510 mg via INTRAVENOUS
  Filled 2021-12-11: qty 12.5

## 2021-12-11 MED ORDER — FENTANYL CITRATE (PF) 100 MCG/2ML IJ SOLN
INTRAMUSCULAR | Status: AC
  Filled 2021-12-11: qty 2

## 2021-12-11 MED ORDER — DEXTROSE 5 % IV SOLN: 5.0000 mg/kg | Freq: Once | INTRAVENOUS | Status: DC

## 2021-12-11 MED ORDER — DIPHENHYDRAMINE HCL 25 MG PO CAPS: 25.0000 mg | ORAL_CAPSULE | Freq: Every day | ORAL | Status: DC | PRN

## 2021-12-11 MED ORDER — LIDOCAINE HCL (PF) 2 % IJ SOLN
INTRAMUSCULAR | Status: AC
  Filled 2021-12-11: qty 5

## 2021-12-11 MED ORDER — MIDAZOLAM HCL 2 MG/2ML IJ SOLN
INTRAMUSCULAR | Status: DC | PRN
  Administered 2021-12-11: 2 mg via INTRAVENOUS

## 2021-12-11 MED ORDER — DEXTROSE 5% FOR FLUSHING BEFORE AND AFTER AMBISOME: 10.0000 mL | INTRAVENOUS | Status: DC

## 2021-12-11 MED ORDER — MEPERIDINE HCL 50 MG/ML IJ SOLN: 25.0000 mg | INTRAMUSCULAR | Status: DC | PRN

## 2021-12-11 MED ORDER — DEXAMETHASONE SODIUM PHOSPHATE 10 MG/ML IJ SOLN
INTRAMUSCULAR | Status: DC | PRN
Start: 2021-12-11 — End: 2021-12-11
  Administered 2021-12-11: 5 mg via INTRAVENOUS

## 2021-12-11 MED ORDER — LACTATED RINGERS IV SOLN: INTRAVENOUS | Status: DC

## 2021-12-11 MED ORDER — OXYCODONE HCL 5 MG/5ML PO SOLN: 5.0000 mg | Freq: Once | ORAL | Status: AC | PRN

## 2021-12-11 MED ORDER — ACETAMINOPHEN 325 MG PO TABS: 650.0000 mg | ORAL_TABLET | Freq: Every day | ORAL | Status: DC | PRN

## 2021-12-11 MED ORDER — GENTAMICIN SULFATE 40 MG/ML IJ SOLN
400.0000 mg | INTRAVENOUS | Status: AC
  Administered 2021-12-11: 400 mg via INTRAVENOUS
  Filled 2021-12-11: qty 10

## 2021-12-11 MED ORDER — PROMETHAZINE HCL 25 MG/ML IJ SOLN: 6.2500 mg | INTRAMUSCULAR | Status: DC | PRN

## 2021-12-11 MED ORDER — CHLORHEXIDINE GLUCONATE 0.12 % MT SOLN
15.0000 mL | OROMUCOSAL | Status: AC
  Administered 2021-12-11: 15 mL via OROMUCOSAL

## 2021-12-11 MED ORDER — FENTANYL CITRATE PF 50 MCG/ML IJ SOSY: 25.0000 ug | PREFILLED_SYRINGE | INTRAMUSCULAR | Status: DC | PRN

## 2021-12-11 MED ORDER — SODIUM CHLORIDE 0.9 % IR SOLN
Status: DC | PRN
  Administered 2021-12-11: 3000 mL

## 2021-12-11 MED ORDER — PROPOFOL 10 MG/ML IV BOLUS
INTRAVENOUS | Status: AC
  Filled 2021-12-11: qty 20

## 2021-12-11 MED ORDER — ONDANSETRON HCL 4 MG/2ML IJ SOLN
INTRAMUSCULAR | Status: DC | PRN
  Administered 2021-12-11: 4 mg via INTRAVENOUS

## 2021-12-11 SURGICAL SUPPLY — 17 items
BAG URO CATCHER STRL LF (MISCELLANEOUS) ×3 IMPLANT
BASKET ZERO TIP NITINOL 2.4FR (BASKET) IMPLANT
BSKT STON RTRVL ZERO TP 2.4FR (BASKET)
CATH URETL OPEN 5X70 (CATHETERS) ×3 IMPLANT
CLOTH BEACON ORANGE TIMEOUT ST (SAFETY) ×3 IMPLANT
EXTRACTOR STONE 1.7FRX115CM (UROLOGICAL SUPPLIES) IMPLANT
GLOVE SURG ENC TEXT LTX SZ7.5 (GLOVE) ×3 IMPLANT
GOWN STRL REUS W/TWL XL LVL3 (GOWN DISPOSABLE) ×3 IMPLANT
GUIDEWIRE ANG ZIPWIRE 038X150 (WIRE) IMPLANT
GUIDEWIRE STR DUAL SENSOR (WIRE) ×3 IMPLANT
MANIFOLD NEPTUNE II (INSTRUMENTS) ×3 IMPLANT
PACK CYSTO (CUSTOM PROCEDURE TRAY) ×3 IMPLANT
SHEATH NAVIGATOR HD 11/13X28 (SHEATH) IMPLANT
SHEATH NAVIGATOR HD 11/13X36 (SHEATH) IMPLANT
STENT URET 6FRX24 CONTOUR (STENTS) ×2 IMPLANT
TUBING CONNECTING 10 (TUBING) ×3 IMPLANT
TUBING UROLOGY SET (TUBING) ×3 IMPLANT

## 2021-12-11 NOTE — Discharge Instructions (Signed)
DISCHARGE INSTRUCTIONS FOR KIDNEY STONE/URETERAL STENT   MEDICATIONS:  1.  Resume all your other meds from home. 2.  Take antibiotics as prescribed.  ACTIVITY:  1. No strenuous activity x 1week  2. No driving while on narcotic pain medications  3. Drink plenty of water  4. Continue to walk at home - you can still get blood clots when you are at home, so keep active, but don't over do it.  5. May return to work/school tomorrow or when you feel ready   BATHING:  1. You can shower and we recommend daily showers    SIGNS/SYMPTOMS TO CALL:  Please call us if you have a fever greater than 101.5, uncontrolled nausea/vomiting, uncontrolled pain, dizziness, unable to urinate, bloody urine, chest pain, shortness of breath, leg swelling, leg pain, redness around wound, drainage from wound, or any other concerns or questions.   You can reach [place] at [phone].   FOLLOW-UP:  1. You will need to f/u with Dr. [name] for further management of your kidney stones.

## 2021-12-11 NOTE — Transfer of Care (Signed)
Immediate Anesthesia Transfer of Care Note  Patient: Grace Singh  Procedure(s) Performed: CYSTOSCOPY WITH URETEROSCOPY AND STENT PLACEMENT (Bilateral)  Patient Location: PACU  Anesthesia Type:General  Level of Consciousness: awake, alert , oriented and patient cooperative  Airway & Oxygen Therapy: Patient Spontanous Breathing and Patient connected to face mask oxygen  Post-op Assessment: Report given to RN and Post -op Vital signs reviewed and stable  Post vital signs: Reviewed and stable  Last Vitals:  Vitals Value Taken Time  BP 139/80 12/11/21 1650  Temp    Pulse 73 12/11/21 1651  Resp 14 12/11/21 1651  SpO2 100 % 12/11/21 1651  Vitals shown include unvalidated device data.  Last Pain:  Vitals:   12/11/21 1435  TempSrc: Oral  PainSc: 6          Complications: No notable events documented.

## 2021-12-11 NOTE — Op Note (Signed)
Preoperative diagnosis:  Right emphysematous pyelitis Bilateral nonobstructing kidney stones Bilateral flank pain  Postoperative diagnosis:  Same  Procedure: Cystoscopy, bilateral retrograde with interpretation Bilateral ureteral stent placement  Surgeon: Ardis Hughs, MD  Anesthesia: General  Complications: None  Intraoperative findings:  1: The patient's bladder was erythematous with white sediment consistent with Candida.  The ureteral orifice ease were orthotopic and there were no other abnormalities. #2: The patient's right retrograde pyelogram demonstrated a narrow caliber ureter with no hydroureteronephrosis.  There was turbid urine in the right renal pelvis that was sent for urine culture. #3: The patient's left retrograde pyelogram demonstrated a normal caliber ureter with no filling defect or abnormalities.  The patient's known stone in the left lower pole was radiopaque and easily identified on fluoroscopy.  EBL: Minimal  Specimens: None  Indication: Grace Singh is a 56 y.o. patient with bilateral flank pain, fever, UA concerning for infection, and a CT scan demonstrating air within the right upper urinary tract.  In addition, the patient has bilateral nonobstructing stones.  After reviewing the management options for treatment, he elected to proceed with the above surgical procedure(s). We have discussed the potential benefits and risks of the procedure, side effects of the proposed treatment, the likelihood of the patient achieving the goals of the procedure, and any potential problems that might occur during the procedure or recuperation. Informed consent has been obtained.  Description of procedure:  The patient was taken to the operating room and general anesthesia was induced.  The patient was placed in the dorsal lithotomy position, prepped and draped in the usual sterile fashion, and preoperative antibiotics were administered. A preoperative time-out was  performed.   21 French 30 degree cystoscope was gently passed through the patient's urethra and into the bladder under visual guidance.  The bladder was irrigated and drained.  Cystoscopy was performed with the above findings.  The left ureteral orifice was then cannulated with a 5 [place] open-ended ureteral catheter and with 10 cc of Omnipaque contrast a retrograde pyelogram was performed with the above findings.  A wire was then advanced up through the open-ended catheter and the catheter removed over the wire.  A 24 cm time 6 French double-J ureteral stent was subsequently placed in the patient's left upper pole over the wire.  Once the stent was noted to be well within the upper pole it was advanced to the bladder neck prior to removing the wire.  I then cannulated the patient's right ureteral orifice and advanced a wire up into the renal pelvis.  I subsequently advanced the open-ended catheter up into the renal pelvis and attempted to drain the infected urine.  This urine was then sent to the lab for a right renal pelvis urine culture.  I then pulled back to the open-ended catheter and injected 5 cc of Omnipaque contrast into the patient's right collecting system to help identify the area and give a roadmap.  The above findings were noted.  I subsequently exchanged the open-ended catheter for a sensor wire and then passed a stent over the wire and into the right renal pelvis noting a nice curl within the interpolar calyx.  Once the stent was noted to be well within the renal pelvis it was advanced to the bladder neck prior to removing the wire.  The stents were noted to be in excellent position prior to emptying the bladder and removing the scope.  The patient was subsequently extubated and returned to the PACU in  stable condition.  In the PACU, the patient was given a dose of IV Amphotericin prior to discharge.  She is being discharged home with fluconazole as well as Bactrim.  We will follow-up her  urine cultures and narrow her coverage appropriately.  Ardis Hughs, M.D.

## 2021-12-11 NOTE — Anesthesia Postprocedure Evaluation (Signed)
Anesthesia Post Note  Patient: Grace Singh  Procedure(s) Performed: CYSTOSCOPY WITH URETEROSCOPY AND STENT PLACEMENT (Bilateral)     Patient location during evaluation: PACU Anesthesia Type: General Level of consciousness: awake Pain management: pain level controlled Vital Signs Assessment: post-procedure vital signs reviewed and stable Respiratory status: spontaneous breathing and respiratory function stable Cardiovascular status: stable Postop Assessment: no apparent nausea or vomiting Anesthetic complications: no   No notable events documented.  Last Vitals:  Vitals:   12/11/21 1700 12/11/21 1710  BP: 132/75   Pulse: 71   Resp: 14   Temp:    SpO2: 100% 100%    Last Pain:  Vitals:   12/11/21 1700  TempSrc:   PainSc: 0-No pain                 Merlinda Frederick

## 2021-12-11 NOTE — Anesthesia Procedure Notes (Signed)
Procedure Name: LMA Insertion Date/Time: 12/11/2021 4:08 PM Performed by: Raenette Rover, CRNA Pre-anesthesia Checklist: Patient identified, Emergency Drugs available, Suction available and Patient being monitored Patient Re-evaluated:Patient Re-evaluated prior to induction Oxygen Delivery Method: Circle system utilized Preoxygenation: Pre-oxygenation with 100% oxygen Induction Type: IV induction Ventilation: Mask ventilation without difficulty LMA: LMA inserted LMA Size: 4.0 Number of attempts: 1 Placement Confirmation: positive ETCO2 and breath sounds checked- equal and bilateral Tube secured with: Tape Dental Injury: Teeth and Oropharynx as per pre-operative assessment

## 2021-12-11 NOTE — Anesthesia Preprocedure Evaluation (Addendum)
Anesthesia Evaluation  Patient identified by MRN, date of birth, ID band Patient awake    Reviewed: Allergy & Precautions, NPO status , Patient's Chart, lab work & pertinent test results  History of Anesthesia Complications Negative for: history of anesthetic complications  Airway Mallampati: II  TM Distance: >3 FB Neck ROM: Full    Dental no notable dental hx.    Pulmonary asthma ,    Pulmonary exam normal breath sounds clear to auscultation       Cardiovascular hypertension, Pt. on medications Normal cardiovascular exam Rhythm:Regular Rate:Normal     Neuro/Psych  Headaches, Seizures -,  Anxiety Depression    GI/Hepatic Neg liver ROS, Gastroparesis   Endo/Other  diabetes, Type 2, Insulin Dependent, Oral Hypoglycemic Agents  Renal/GU Renal InsufficiencyRenal diseaseEmphysematous Pyelonephritis  negative genitourinary   Musculoskeletal  (+) Arthritis , Fibromyalgia -  Abdominal   Peds  Hematology negative hematology ROS (+)   Anesthesia Other Findings Day of surgery medications reviewed with patient.  Reproductive/Obstetrics negative OB ROS                            Anesthesia Physical Anesthesia Plan  ASA: 2 and emergent  Anesthesia Plan: General   Post-op Pain Management: Tylenol PO (pre-op)   Induction: Intravenous  PONV Risk Score and Plan: 4 or greater and Treatment may vary due to age or medical condition, Ondansetron, Midazolam and Dexamethasone  Airway Management Planned: LMA  Additional Equipment: None  Intra-op Plan:   Post-operative Plan: Extubation in OR  Informed Consent: I have reviewed the patients History and Physical, chart, labs and discussed the procedure including the risks, benefits and alternatives for the proposed anesthesia with the patient or authorized representative who has indicated his/her understanding and acceptance.     Dental advisory  given  Plan Discussed with: CRNA, Anesthesiologist and Surgeon  Anesthesia Plan Comments:        Anesthesia Quick Evaluation

## 2021-12-11 NOTE — Interval H&P Note (Signed)
History and Physical Interval Note:  12/11/2021 3:57 PM  Grace Singh  has presented today for surgery, with the diagnosis of Emphysematous Pyelonephritis.  The various methods of treatment have been discussed with the patient and family. After consideration of risks, benefits and other options for treatment, the patient has consented to  Procedure(s): CYSTOSCOPY WITH URETEROSCOPY AND STENT PLACEMENT (Bilateral) as a surgical intervention.  The patient's history has been reviewed, patient examined, no change in status, stable for surgery.  I have reviewed the patient's chart and labs.  Questions were answered to the patient's satisfaction.     Ardis Hughs

## 2021-12-11 NOTE — H&P (Signed)
56 year old female who presents to the clinic today for further evaluation of bilateral flank pain.   The patient was seen in the emergency department yesterday evening and triaged. She received a CT scan of the abdomen and pelvis as well as labs and a urine analysis. Given the long wait she did not stay and was never seen by her provider. The CT scan did demonstrate air in the right collecting system and bilateral nonobstructing stones. Her urinalysis was turbid in demonstrated significant glucose urea, leukocytes, and many yeast. There is no bacteria present. The patient's renal function was normal.   The patient states that she febrile to 101.5 last night. Today she has been afebrile. She continues to have bilateral flank pain. She is otherwise hemodynamically stable. She is having some irritative voiding symptoms.   The patient has a history of urosepsis with Klebsiella sensitive to Bactrim. She was followed by Dr. Bess Harvest for her nonobstructing stones and recurrent cystitis.   Urine cultures and our EMR as well as in the [facility] EMR demonstrate yeast as well as yeah course Pseudomonas in 2014.     ALLERGIES: No Allergies    MEDICATIONS: Aspirin 325 mg tablet  Hydrochlorothiazide 25 mg tablet  Lisinopril 10 mg tablet  Metformin Hcl 1,000 mg tablet  Amitriptyline Hcl 100 mg tablet Oral  Bupropion Hcl 75 mg tablet Oral  Bupropion Hcl Sr 150 mg tablet,sustained-release 12 hr  Buspirone Hcl 10 mg tablet  Citalopram Hbr 40 mg tablet Oral  Citalopram Hbr 40 mg tablet  Gemfibrozil  Hydrochlorothiazide 25 mg tablet Oral  Iron 256 mg (28 mg iron) tablet Oral  Iron  Jardiance 25 mg tablet  Lamotrigine  Lantus SoloStar 100 UNIT/ML Subcutaneous Solution Pen-injector Subcutaneous  Lisinopril 20 mg tablet Oral  Lorazepam 1 mg tablet  Lorazepam 0.5 mg tablet 0 Oral  Metformin Hcl 1,000 mg tablet Oral  Metoclopramide Hcl 5 mg tablet  Omega 3  Ondansetron Hcl 8 mg tablet  Oxycodone-Acetaminophen  7.5 mg-325 mg tablet  Oxycodone-Acetaminophen 7.5 mg-325 mg tablet Oral  Ozempic  Pantoprazole Sodium 40 mg tablet, delayed release  Pioglitazone Hcl 45 mg tablet  Pioglitazone Hcl 45 mg tablet  Promethazine Hcl  Rosuvastatin Calcium 20 mg tablet  Tizanidine Hcl 4 mg capsule  TiZANidine HCl - 4 MG Oral Tablet Oral  Topiramate 25 mg tablet Oral  Topiramate 50 mg tablet  Victoza 18 MG/3ML Subcutaneous Solution Pen-injector Subcutaneous  Vitamin D3 125 mcg (5,000 unit) tablet Oral  Vitamin D3 125 mcg (5,000 unit) tablet  Zofran  Zolpidem Tartrate 5 mg tablet     GU PSH: Cysto Uretero Lithotripsy - 2014, 2014 Cystoscopy Insert Stent - 2014, 2014 Cystoscopy Ureteroscopy - 2014       [place] Notes: gastric tumor removed 12/05/16  spinal cord stimulator (2), coccyx removal   NON-GU PSH: Appendectomy - 2008 Cholecystectomy (open) - 2008     GU PMH: Chronic cystitis (w/o hematuria), Chronic cystitis - 2017 Other specified postprocedural states, Status post insertion of spinal cord stimulator - 2017 Renal calculus, Nephrolithiasis - 2017 Oth urogenital candidiasis, Genitourinary infection, candidal - 2017 Atrophy of kidney, Atrophic kidney, acquired - 2017 Urinary Tract Inf, Unspec site, Pyuria - 2017 Urinary Urgency, Urinary urgency - 2016      PMH Notes:  2007-08-30 08:48:34 - Note: Arthritis   Gastric tumor: swannoma tumor   NON-GU PMH: Encounter for general adult medical examination without abnormal findings, Encounter for preventive health examination - 2017 Hypercalciuria, Hypercalciuria - 2017 Anxiety, Anxiety (Symptom) -  2014 Asthma, Asthma - 2014 Fibromyalgia, Fibromyalgia - 2014 Personal history of other diseases of the circulatory system, History of hypertension - 2014 Personal history of other endocrine, nutritional and metabolic disease, History of hypercholesterolemia - 2014, History of diabetes mellitus, - 2014 Personal history of other mental and behavioral  disorders, History of depression - 2014 Arthritis Depression Diabetes Type 2 Hypercholesterolemia Hypertension    FAMILY HISTORY: Alzheimer's Disease - Mother, Father Brain tumor - Brother Breast Cancer - Mother Cancer - Runs In Family Death In The Family Father - Father Death In The Family Mother - Mother Diabetes - Runs In Family Heart Attack - Father Heart Disease - Father Hypertension - Runs In Family Lewy body dementia - Mother Lung Cancer - Brother Pure Hypercholesterolemia - Runs In Family   SOCIAL HISTORY: Marital Status: Married Preferred Language: English; Ethnicity: Not Hispanic Or Latino; Race: White Current Smoking Status: Patient has never smoked.   Tobacco Use Assessment Completed: Used Tobacco in last 30 days? Does drink.  Drinks 1 caffeinated drink per day. Patient's occupation is/was Disabled.     Notes: Unemployed, Caffeine Use, Never A Smoker, Marital History - Currently Married, Alcohol Use   REVIEW OF SYSTEMS:    GU Review Female:   Patient reports frequent urination and get up at night to urinate. Patient denies hard to postpone urination, burning /pain with urination, leakage of urine, stream starts and stops, trouble starting your stream, have to strain to urinate, and being pregnant.  Gastrointestinal (Upper):   Patient reports nausea and vomiting. Patient denies indigestion/ heartburn.  Gastrointestinal (Lower):   Patient reports diarrhea. Patient denies constipation.  Constitutional:   Patient reports fever, weight loss, and fatigue. Patient denies night sweats.  Skin:   Patient denies skin rash/ lesion and itching.  Eyes:   Patient reports blurred vision. Patient denies double vision.  Ears/ Nose/ Throat:   Patient denies sore throat and sinus problems.  Hematologic/Lymphatic:   Patient denies swollen glands and easy bruising.  Cardiovascular:   Patient denies leg swelling and chest pains.  Respiratory:   Patient denies cough and shortness of  breath.  Endocrine:   Patient reports excessive thirst.   Musculoskeletal:   Patient reports back pain and joint pain.   Neurological:   Patient reports headaches and dizziness.   Psychologic:   Patient reports depression and anxiety.    VITAL SIGNS:      12/11/2021 11:39 AM  Weight 226 lb / 102.51 kg  Height 56 in / 142.24 cm  BP 110/74 mmHg  Pulse 86 /min  BMI 50.7 kg/m   GU PHYSICAL EXAMINATION:    External Genitalia: No hirsutism, no rash, no scarring, no cyst, no erythematous lesion, no papular lesion, no blanched lesion, no warty lesion. No edema.  Urethral Meatus: Normal size. Normal position. No discharge.  Urethra: No tenderness, no mass, no scarring. No hypermobility. No leakage.  Bladder: Normal to palpation, no tenderness, no mass, normal size.  Vagina: No atrophy, no stenosis. No rectocele. No cystocele. No enterocele.  Cervix: No inflammation, no discharge, no lesion, no tenderness, no wart.  Uterus: Normal size. Normal consistency. Normal position. No mobility. No descent.  Anus and Perineum: No hemorrhoids. No anal stenosis. No rectal fissure, no anal fissure. No edema, no dimple, no perineal tenderness, no anal tenderness.   MULTI-SYSTEM PHYSICAL EXAMINATION:    Constitutional: Well-nourished. No physical deformities. Normally developed. Good grooming.  Neck: Neck symmetrical, not swollen. Normal tracheal position.  Respiratory: No labored breathing, no  use of accessory muscles.   Cardiovascular: Normal temperature, normal extremity pulses, no swelling, no varicosities.  Lymphatic: No enlargement of neck, axillae, groin.  Skin: No paleness, no jaundice, no cyanosis. No lesion, no ulcer, no rash.  Neurologic / Psychiatric: Oriented to time, oriented to place, oriented to person. No depression, no anxiety, no agitation.  Gastrointestinal: No mass, no tenderness, no rigidity, non obese abdomen.  Eyes: Normal conjunctivae. Normal eyelids.  Ears, Nose, Mouth, and  Throat: Left ear no scars, no lesions, no masses. Right ear no scars, no lesions, no masses. Nose no scars, no lesions, no masses. Normal hearing. Normal lips.  Musculoskeletal: Normal gait and station of head and neck.     Complexity of Data:  Source Of History:  Patient  Records Review:   Previous Doctor Records, Previous Patient Records, POC Tool  Urine Test Review:   Urinalysis  X-Ray Review: C.T. Abdomen/Pelvis: Reviewed Films. Discussed With Patient.     PROCEDURES:          Ceftriaxone 1g - [id number], [id number] Qty: 1 Adm. By: Cristobal Goldmann  Unit: gram Lot No [id number]  Route: IM Exp. Date 01/07/2024  Freq: None Mfgr.:   Site: Right Buttock         Ketoralac 60mg  - [id number], [id number] Qty: 60 Adm. By: Cristobal Goldmann  Unit: mg Lot No [id number]  Route: IM Exp. Date 06/05/2022  Freq: None Mfgr.:   Site: Left Buttock   ASSESSMENT:      ICD-10 Details  1 GU:   Atrophy of kidney - N26.1   2   Chronic cystitis (w/o hematuria) - N30.20   3   Renal calculus - N20.0    PLAN:            Medications New Meds: Bactrim Ds 800 mg-160 mg tablet 1 tablet PO BID   #20  0 Refill(s)  Diflucan 100 mg tablet 1 tablet PO Daily   #10  0 Refill(s)  Pharmacy Name:  [facility]  Address:  [address] [place], [place] [address]  Phone:  [phone]  Fax:  [phone]) [phone]            Orders Labs Urine Culture          Document Letter(s):  Created for Patient: Clinical Summary         Notes:   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 She has bilateral flank pain and nonobstructing stones. Our plan is to take the patient to the operating room and place stents bilaterally. We will put her on ciprofloxacin with follow-up on her cultures. Hopefully we can send her home today, but if she deteriorates in the perioperative setting she will need hospitalization. Ultimately, the patient would benefit from removal of her bilateral nonobstructing stones. This  will be facilitated more easily by her stent placement. She has had stents in the past, she is familiar with these. We reviewed the surgery quickly as well as the associated risk and benefit. She opted to proceed.

## 2021-12-12 ENCOUNTER — Encounter (HOSPITAL_COMMUNITY): Payer: Self-pay | Admitting: Urology

## 2021-12-13 LAB — URINE CULTURE: Culture: >=100000 — AB

## 2021-12-18 LAB — FUNGUS CULTURE RESULT

## 2021-12-18 LAB — FUNGUS CULTURE WITH STAIN

## 2021-12-18 LAB — FUNGAL ORGANISM REFLEX

## 2021-12-25 ENCOUNTER — Other Ambulatory Visit: Payer: Self-pay | Admitting: Urology

## 2021-12-28 ENCOUNTER — Encounter (HOSPITAL_COMMUNITY): Payer: Self-pay

## 2021-12-28 ENCOUNTER — Other Ambulatory Visit: Payer: Self-pay

## 2021-12-28 ENCOUNTER — Encounter (HOSPITAL_COMMUNITY)
Admission: RE | Admit: 2021-12-28 | Discharge: 2021-12-28 | Disposition: A | Discharge status: Home / Self Care | Payer: PPO | Source: Ambulatory Visit | Attending: Urology | Admitting: Urology

## 2021-12-28 ENCOUNTER — Other Ambulatory Visit: Payer: Self-pay | Admitting: Urology

## 2021-12-28 VITALS — BP 137/86 | HR 80 | Temp 98.3°F | Resp 18 | Ht 66.0 in | Wt 220.0 lb

## 2021-12-28 DIAGNOSIS — Z01818 Encounter for other preprocedural examination: Secondary | ICD-10-CM | POA: Diagnosis present

## 2021-12-28 DIAGNOSIS — E119 Type 2 diabetes mellitus without complications: Secondary | ICD-10-CM | POA: Diagnosis not present

## 2021-12-28 LAB — HEMOGLOBIN A1C
Hgb A1c MFr Bld: 5.9 % — ABNORMAL HIGH (ref 4.8–5.6)
Mean Plasma Glucose: 123 mg/dL

## 2021-12-28 LAB — CBC
HCT: 44 % (ref 36.0–46.0)
Hemoglobin: 14 g/dL (ref 12.0–15.0)
MCH: 30.2 pg (ref 26.0–34.0)
MCHC: 31.8 g/dL (ref 30.0–36.0)
MCV: 95 fL (ref 80.0–100.0)
Platelets: 312 10*3/uL (ref 150–400)
RBC: 4.63 MIL/uL (ref 3.87–5.11)
RDW: 13.8 % (ref 11.5–15.5)
WBC: 8.2 10*3/uL (ref 4.0–10.5)
nRBC: 0 % (ref 0.0–0.2)

## 2021-12-28 LAB — BASIC METABOLIC PANEL
Anion gap: 8 (ref 5–15)
BUN: 22 mg/dL — ABNORMAL HIGH (ref 6–20)
CO2: 27 mmol/L (ref 22–32)
Calcium: 10 mg/dL (ref 8.9–10.3)
Chloride: 103 mmol/L (ref 98–111)
Creatinine, Ser: 1.01 mg/dL — ABNORMAL HIGH (ref 0.44–1.00)
GFR, Estimated: >60 mL/min (ref >60)
Glucose, Bld: 130 mg/dL — ABNORMAL HIGH (ref 70–99)
Potassium: 4.6 mmol/L (ref 3.5–5.1)
Sodium: 138 mmol/L (ref 135–145)

## 2021-12-28 LAB — GLUCOSE, CAPILLARY: Glucose-Capillary: 119 mg/dL — ABNORMAL HIGH (ref 70–99)

## 2021-12-28 NOTE — Progress Notes (Signed)
COVID test- NA  PCP - Dr. Genoveva Ill Cardiologist - none  Chest x-ray - no EKG - 12/28/21-chart Stress Test - no ECHO - no Cardiac Cath - no Pacemaker/ICD device last checked:NA  Sleep Study - NA CPAP -   Fasting Blood Sugar - 120-145 Checks Blood Sugar __QD___ times a day  Blood Thinner Instructions:ASA 81 Aspirin Instructions:Stop 3 days prior to DOS / Dr. [name] Last Dose:12/27/21  Anesthesia review: yes  Patient denies shortness of breath, fever, cough and chest pain at PAT appointment Pt has no SOB with activities. She has a spinal cord stimulator  Patient verbalized understanding of instructions that were given to them at the PAT appointment. Patient was also instructed that they will need to review over the PAT instructions again at home before surgery. yes

## 2021-12-28 NOTE — Patient Instructions (Addendum)
DUE TO COVID-19 ONLY ONE VISITOR IS ALLOWED TO COME WITH YOU AND STAY IN THE WAITING ROOM ONLY DURING PRE OP AND PROCEDURE DAY OF SURGERY IF YOU ARE GOING HOME AFTER SURGERY. IF YOU ARE SPENDING THE NIGHT 2 PEOPLE MAY VISIT WITH YOU IN YOUR PRIVATE ROOM AFTER SURGERY UNTIL VISITING  HOURS ARE OVER AT 800 PM AND 1  VISITOR  MAY  SPEND THE NIGHT.                  Grace GIENGER     Your procedure is scheduled on: 12/30/21   Report to [facility] Main  Entrance   Report to admitting at  1:30 PM     Call this number if you have problems the morning of surgery [phone]    Remember: Do not eat food  :After Midnight the night before your surgery,   You may have clear liquids from midnight until ---.9:45 am    CLEAR LIQUID DIET   Foods Allowed                                                                     Foods Excluded  Coffee and tea, regular and decaf                             liquids that you cannot  Plain Jell-O any favor except red or purple                                           see through such as: Fruit ices (not with fruit pulp)                                     milk, soups, orange juice  Iced Popsicles                                    All solid food Carbonated beverages, regular and diet                                    Cranberry, grape and apple juices Sports drinks like Gatorade Lightly seasoned clear broth or consume(fat free) Sugar    BRUSH YOUR TEETH MORNING OF SURGERY AND RINSE YOUR MOUTH OUT, NO CHEWING GUM CANDY OR MINTS.     Take these medicines the morning of surgery with A SIP OF WATER: Buspirone, Lamictal, Topamax, Bupropion, Citalopram, Pantoprazole,  Use your inhaler and bring it with you.  Bring your remote  for your spinal stimulator   Stop taking __ASA_________on __1/22________as instructed by _____________.   Contact your Surgeon/Cardiologist for instructions on Anticoagulant Therapy prior to surgery.   How to Manage  Your Diabetes Before and After Surgery  Why is it important to control my blood sugar before and after surgery? Improving blood sugar levels before and after surgery helps healing  and can limit problems. A way of improving blood sugar control is eating a healthy diet by:  Eating less sugar and carbohydrates  Increasing activity/exercise  Talking with your doctor about reaching your blood sugar goals High blood sugars (greater than 180 mg/dL) can raise your risk of infections and slow your recovery, so you will need to focus on controlling your diabetes during the weeks before surgery. Make sure that the doctor who takes care of your diabetes knows about your planned surgery including the date and location.  How do I manage my blood sugar before surgery? Check your blood sugar at least 4 times a day, starting 2 days before surgery, to make sure that the level is not too high or low. Check your blood sugar the morning of your surgery when you wake up and every 2 hours until you get to the Short Stay unit. If your blood sugar is less than 70 mg/dL, you will need to treat for low blood sugar: Do not take insulin. Treat a low blood sugar (less than 70 mg/dL) with  cup of clear juice (cranberry or apple), 4 glucose tablets, OR glucose gel. Recheck blood sugar in 15 minutes after treatment (to make sure it is greater than 70 mg/dL). If your blood sugar is not greater than 70 mg/dL on recheck, call [phone] for further instructions. Report your blood sugar to the short stay nurse when you get to Short Stay.  If you are admitted to the hospital after surgery: Your blood sugar will be checked by the staff and you will probably be given insulin after surgery (instead of oral diabetes medicines) to make sure you have good blood sugar levels. The goal for blood sugar control after surgery is 80-180 mg/dL.   WHAT DO I DO ABOUT MY DIABETES MEDICATION?  Do not take oral diabetes medicines (pills)  the morning of surgery.        The day of surgery, do not take other diabetes injectables, including Byetta (exenatide), Bydureon (exenatide ER), Victoza (liraglutide), or Trulicity (dulaglutide).      DO NOT TAKE ANY DIABETIC MEDICATIONS DAY OF YOUR SURGERY                               You may not have any metal on your body including hair pins and              piercings  Do not wear jewelry, make-up, lotions, powders or perfumes, deodorant             Do not wear nail polish on your fingernails.  Do not shave  48 hours prior to surgery.                 Do not bring valuables to the hospital. [place].  Contacts, dentures or bridgework may not be worn into surgery..     Patients discharged the day of surgery will not be allowed to drive home.  IF YOU ARE HAVING SURGERY AND GOING HOME THE SAME DAY, YOU MUST HAVE AN ADULT TO DRIVE YOU HOME AND BE WITH YOU FOR 24 HOURS. YOU MAY GO HOME BY TAXI OR UBER OR ORTHERWISE, BUT AN ADULT MUST ACCOMPANY YOU HOME AND STAY WITH YOU FOR 24 HOURS.  Name and phone number of your driver:  Special Instructions: N/A  Please read over the following fact sheets you were given: _____________________________________________________________________             [facility] - Preparing for Surgery Before surgery, you can play an important role.  Because skin is not sterile, your skin needs to be as free of germs as possible.  You can reduce the number of germs on your skin by washing with CHG (chlorahexidine gluconate) soap before surgery.  CHG is an antiseptic cleaner which kills germs and bonds with the skin to continue killing germs even after washing. Please DO NOT use if you have an allergy to CHG or antibacterial soaps.  If your skin becomes reddened/irritated stop using the CHG and inform your nurse when you arrive at Short Stay. Do not shave (including legs and underarms) for at least 48  hours prior to the first CHG shower. Please follow these instructions carefully:  1.  Shower with CHG Soap the night before surgery and the  morning of Surgery.  2.  If you choose to wash your hair, wash your hair first as usual with your  normal  shampoo.  3.  After you shampoo, rinse your hair and body thoroughly to remove the  shampoo.                            4.  Use CHG as you would any other liquid soap.  You can apply chg directly  to the skin and wash                       Gently with a scrungie or clean washcloth.  5.  Apply the CHG Soap to your body ONLY FROM THE NECK DOWN.   Do not use on face/ open                           Wound or open sores. Avoid contact with eyes, ears mouth and genitals (private parts).                       Wash face,  Genitals (private parts) with your normal soap.             6.  Wash thoroughly, paying special attention to the area where your surgery  will be performed.  7.  Thoroughly rinse your body with warm water from the neck down.  8.  DO NOT shower/wash with your normal soap after using and rinsing off  the CHG Soap.                9.  Pat yourself dry with a clean towel.            10.  Wear clean pajamas.            11.  Place clean sheets on your bed the night of your first shower and do not  sleep with pets. Day of Surgery : Do not apply any lotions/deodorants the morning of surgery.  Please wear clean clothes to the hospital/surgery center.  FAILURE TO FOLLOW THESE INSTRUCTIONS MAY RESULT IN THE CANCELLATION OF YOUR SURGERY PATIENT SIGNATURE_________________________________  NURSE SIGNATURE__________________________________  ________________________________________________________________________

## 2021-12-30 ENCOUNTER — Encounter (HOSPITAL_COMMUNITY): Payer: Self-pay | Admitting: Urology

## 2021-12-30 ENCOUNTER — Encounter (HOSPITAL_COMMUNITY)
Admission: RE | Disposition: A | Discharge status: Home / Self Care | Payer: Self-pay | Source: Ambulatory Visit | Attending: Urology

## 2021-12-30 ENCOUNTER — Ambulatory Visit (HOSPITAL_COMMUNITY)
Admission: RE | Admit: 2021-12-30 | Discharge: 2021-12-30 | Disposition: A | Discharge status: Home / Self Care | Payer: PPO | Source: Ambulatory Visit | Attending: Urology | Admitting: Urology

## 2021-12-30 ENCOUNTER — Ambulatory Visit (HOSPITAL_COMMUNITY): Payer: PPO | Admitting: Physician Assistant

## 2021-12-30 ENCOUNTER — Ambulatory Visit (HOSPITAL_COMMUNITY): Payer: PPO

## 2021-12-30 ENCOUNTER — Ambulatory Visit (HOSPITAL_COMMUNITY): Payer: PPO | Admitting: Anesthesiology

## 2021-12-30 DIAGNOSIS — N200 Calculus of kidney: Secondary | ICD-10-CM | POA: Diagnosis present

## 2021-12-30 DIAGNOSIS — E119 Type 2 diabetes mellitus without complications: Secondary | ICD-10-CM | POA: Insufficient documentation

## 2021-12-30 DIAGNOSIS — Z6835 Body mass index (BMI) 35.0-35.9, adult: Secondary | ICD-10-CM | POA: Insufficient documentation

## 2021-12-30 DIAGNOSIS — N3020 Other chronic cystitis without hematuria: Secondary | ICD-10-CM | POA: Insufficient documentation

## 2021-12-30 DIAGNOSIS — R82994 Hypercalciuria: Secondary | ICD-10-CM | POA: Insufficient documentation

## 2021-12-30 DIAGNOSIS — N261 Atrophy of kidney (terminal): Secondary | ICD-10-CM | POA: Insufficient documentation

## 2021-12-30 DIAGNOSIS — R82991 Hypocitraturia: Secondary | ICD-10-CM | POA: Insufficient documentation

## 2021-12-30 DIAGNOSIS — Z87440 Personal history of urinary (tract) infections: Secondary | ICD-10-CM | POA: Insufficient documentation

## 2021-12-30 DIAGNOSIS — E8889 Other specified metabolic disorders: Secondary | ICD-10-CM | POA: Diagnosis not present

## 2021-12-30 HISTORY — PX: CYSTOSCOPY WITH RETROGRADE PYELOGRAM, URETEROSCOPY AND STENT PLACEMENT: SHX5789

## 2021-12-30 HISTORY — PX: HOLMIUM LASER APPLICATION: SHX5852

## 2021-12-30 LAB — GLUCOSE, CAPILLARY
Glucose-Capillary: 127 mg/dL — ABNORMAL HIGH (ref 70–99)
Glucose-Capillary: 139 mg/dL — ABNORMAL HIGH (ref 70–99)

## 2021-12-30 SURGERY — CYSTOURETEROSCOPY, WITH RETROGRADE PYELOGRAM AND STENT INSERTION
Anesthesia: General | Laterality: Bilateral

## 2021-12-30 MED ORDER — OXYCODONE HCL 5 MG/5ML PO SOLN: 5.0000 mg | Freq: Once | ORAL | Status: AC | PRN

## 2021-12-30 MED ORDER — CHLORHEXIDINE GLUCONATE 0.12 % MT SOLN
15.0000 mL | Freq: Once | OROMUCOSAL | Status: AC
  Administered 2021-12-30: 14:00:00 15 mL via OROMUCOSAL

## 2021-12-30 MED ORDER — ORAL CARE MOUTH RINSE: 15.0000 mL | Freq: Once | OROMUCOSAL | Status: AC

## 2021-12-30 MED ORDER — FENTANYL CITRATE PF 50 MCG/ML IJ SOSY: 25.0000 ug | PREFILLED_SYRINGE | INTRAMUSCULAR | Status: DC | PRN

## 2021-12-30 MED ORDER — ACETAMINOPHEN 10 MG/ML IV SOLN
INTRAVENOUS | Status: AC
  Filled 2021-12-30: qty 100

## 2021-12-30 MED ORDER — ONDANSETRON HCL 4 MG/2ML IJ SOLN
INTRAMUSCULAR | Status: AC
  Filled 2021-12-30: qty 2

## 2021-12-30 MED ORDER — DEXAMETHASONE SODIUM PHOSPHATE 10 MG/ML IJ SOLN
INTRAMUSCULAR | Status: DC | PRN
  Administered 2021-12-30: 10 mg via INTRAVENOUS

## 2021-12-30 MED ORDER — OXYCODONE-ACETAMINOPHEN 7.5-325 MG PO TABS: 1.0000 | ORAL_TABLET | Freq: Four times a day (QID) | ORAL | 0 refills | Status: AC | PRN

## 2021-12-30 MED ORDER — FLUCONAZOLE IN SODIUM CHLORIDE 200-0.9 MG/100ML-% IV SOLN
200.0000 mg | INTRAVENOUS | Status: DC
  Administered 2021-12-30: 15:00:00 200 mg via INTRAVENOUS
  Filled 2021-12-30: qty 100

## 2021-12-30 MED ORDER — IOHEXOL 300 MG/ML  SOLN
INTRAMUSCULAR | Status: DC | PRN
  Administered 2021-12-30: 16:00:00 10 mL via URETHRAL

## 2021-12-30 MED ORDER — VORICONAZOLE 200 MG PO TABS: 200.0000 mg | ORAL_TABLET | Freq: Two times a day (BID) | ORAL | 0 refills | Status: AC

## 2021-12-30 MED ORDER — ACETAMINOPHEN 10 MG/ML IV SOLN
1000.0000 mg | Freq: Once | INTRAVENOUS | Status: DC | PRN
  Administered 2021-12-30: 16:00:00 1000 mg via INTRAVENOUS

## 2021-12-30 MED ORDER — FENTANYL CITRATE (PF) 100 MCG/2ML IJ SOLN
INTRAMUSCULAR | Status: DC | PRN
  Administered 2021-12-30 (×2): 25 ug via INTRAVENOUS
  Administered 2021-12-30: 50 ug via INTRAVENOUS

## 2021-12-30 MED ORDER — LIDOCAINE 2% (20 MG/ML) 5 ML SYRINGE
INTRAMUSCULAR | Status: DC | PRN
  Administered 2021-12-30: 100 mg via INTRAVENOUS

## 2021-12-30 MED ORDER — OXYCODONE HCL 5 MG PO TABS
5.0000 mg | ORAL_TABLET | Freq: Once | ORAL | Status: AC | PRN
  Administered 2021-12-30: 16:00:00 5 mg via ORAL

## 2021-12-30 MED ORDER — LACTATED RINGERS IV SOLN: INTRAVENOUS | Status: DC

## 2021-12-30 MED ORDER — ONDANSETRON HCL 4 MG/2ML IJ SOLN
INTRAMUSCULAR | Status: DC | PRN
  Administered 2021-12-30: 4 mg via INTRAVENOUS

## 2021-12-30 MED ORDER — MIDAZOLAM HCL 2 MG/2ML IJ SOLN
INTRAMUSCULAR | Status: AC
  Filled 2021-12-30: qty 2

## 2021-12-30 MED ORDER — FLUCONAZOLE 100MG IVPB
100.0000 mg | INTRAVENOUS | Status: DC
  Filled 2021-12-30 (×2): qty 50

## 2021-12-30 MED ORDER — LIDOCAINE HCL (PF) 2 % IJ SOLN
INTRAMUSCULAR | Status: AC
  Filled 2021-12-30: qty 5

## 2021-12-30 MED ORDER — DEXAMETHASONE SODIUM PHOSPHATE 10 MG/ML IJ SOLN
INTRAMUSCULAR | Status: AC
  Filled 2021-12-30: qty 1

## 2021-12-30 MED ORDER — FENTANYL CITRATE PF 50 MCG/ML IJ SOSY
PREFILLED_SYRINGE | INTRAMUSCULAR | Status: AC
  Administered 2021-12-30: 17:00:00 50 ug via INTRAVENOUS
  Filled 2021-12-30: qty 1

## 2021-12-30 MED ORDER — PROPOFOL 10 MG/ML IV BOLUS
INTRAVENOUS | Status: DC | PRN
Start: 2021-12-30 — End: 2021-12-30
  Administered 2021-12-30: 160 mg via INTRAVENOUS

## 2021-12-30 MED ORDER — GENTAMICIN SULFATE 40 MG/ML IJ SOLN
5.0000 mg/kg | INTRAVENOUS | Status: AC
  Administered 2021-12-30: 15:00:00 380 mg via INTRAVENOUS
  Filled 2021-12-30: qty 9.5

## 2021-12-30 MED ORDER — FENTANYL CITRATE PF 50 MCG/ML IJ SOSY
PREFILLED_SYRINGE | INTRAMUSCULAR | Status: AC
  Administered 2021-12-30: 16:00:00 50 ug via INTRAVENOUS
  Filled 2021-12-30: qty 1

## 2021-12-30 MED ORDER — OXYCODONE HCL 5 MG PO TABS
ORAL_TABLET | ORAL | Status: AC
  Filled 2021-12-30: qty 1

## 2021-12-30 MED ORDER — PROPOFOL 10 MG/ML IV BOLUS
INTRAVENOUS | Status: AC
  Filled 2021-12-30: qty 20

## 2021-12-30 MED ORDER — FENTANYL CITRATE (PF) 100 MCG/2ML IJ SOLN
INTRAMUSCULAR | Status: AC
  Filled 2021-12-30: qty 2

## 2021-12-30 MED ORDER — PROMETHAZINE HCL 25 MG/ML IJ SOLN: 6.2500 mg | INTRAMUSCULAR | Status: DC | PRN

## 2021-12-30 MED ORDER — MIDAZOLAM HCL 5 MG/5ML IJ SOLN
INTRAMUSCULAR | Status: DC | PRN
  Administered 2021-12-30: 2 mg via INTRAVENOUS

## 2021-12-30 MED ORDER — SODIUM CHLORIDE 0.9 % IR SOLN
Status: DC | PRN
  Administered 2021-12-30: 6000 mL via INTRAVESICAL

## 2021-12-30 SURGICAL SUPPLY — 25 items
BAG URO CATCHER STRL LF (MISCELLANEOUS) ×3 IMPLANT
BASKET LASER NITINOL 1.9FR (BASKET) IMPLANT
BASKET STONE NCOMPASS (UROLOGICAL SUPPLIES) ×1 IMPLANT
BSKT STON RTRVL 120 1.9FR (BASKET)
CATH URETL OPEN END 6FR 70 (CATHETERS) ×3 IMPLANT
CLOTH BEACON ORANGE TIMEOUT ST (SAFETY) ×3 IMPLANT
EXTRACTOR STONE 1.7FRX115CM (UROLOGICAL SUPPLIES) IMPLANT
GLOVE SURG ENC TEXT LTX SZ7.5 (GLOVE) ×4 IMPLANT
GOWN STRL REUS W/TWL LRG LVL3 (GOWN DISPOSABLE) ×4 IMPLANT
GUIDEWIRE ANG ZIPWIRE 038X150 (WIRE) ×3 IMPLANT
GUIDEWIRE STR DUAL SENSOR (WIRE) ×3 IMPLANT
KIT TURNOVER KIT A (KITS) ×1 IMPLANT
LASER FIB FLEXIVA PULSE ID 365 (Laser) IMPLANT
LASER FIB FLEXIVA PULSE ID 550 (Laser) IMPLANT
LASER FIB FLEXIVA PULSE ID 910 (Laser) IMPLANT
MANIFOLD NEPTUNE II (INSTRUMENTS) ×3 IMPLANT
PACK CYSTO (CUSTOM PROCEDURE TRAY) ×3 IMPLANT
SHEATH NAVIGATOR HD 11/13X28 (SHEATH) ×1 IMPLANT
SHEATH NAVIGATOR HD 11/13X36 (SHEATH) IMPLANT
STENT POLARIS 5FRX24 (STENTS) ×2 IMPLANT
TRACTIP FLEXIVA PULS ID 200XHI (Laser) IMPLANT
TRACTIP FLEXIVA PULSE ID 200 (Laser)
TUBE FEEDING 8FR 16IN STR KANG (MISCELLANEOUS) ×3 IMPLANT
TUBING CONNECTING 10 (TUBING) ×3 IMPLANT
TUBING UROLOGY SET (TUBING) ×3 IMPLANT

## 2021-12-30 NOTE — Anesthesia Postprocedure Evaluation (Signed)
Anesthesia Post Note  Patient: Grace Singh  Procedure(s) Performed: CYSTOSCOPY WITH RETROGRADE PYELOGRAM, URETEROSCOPY AND STENT EXCHANGES, STONE BASKET REMOVAL OF STONES (Bilateral) HOLMIUM LASER APPLICATION (Bilateral)     Patient location during evaluation: PACU Anesthesia Type: General Level of consciousness: awake and alert Pain management: pain level controlled Vital Signs Assessment: post-procedure vital signs reviewed and stable Respiratory status: spontaneous breathing, nonlabored ventilation, respiratory function stable and patient connected to nasal cannula oxygen Cardiovascular status: blood pressure returned to baseline and stable Postop Assessment: no apparent nausea or vomiting Anesthetic complications: no   No notable events documented.  Last Vitals:  Vitals:   12/30/21 1645 12/30/21 1652  BP: 134/72   Pulse: 71 71  Resp: (!) 22 16  Temp: 36.6 C   SpO2: 92% 93%    Last Pain:  Vitals:   12/30/21 1645  TempSrc:   PainSc: 3                  Lacreasha Hinds S

## 2021-12-30 NOTE — Discharge Instructions (Addendum)
1 - You may have urinary urgency (bladder spasms) and bloody urine on / off with stent in place. This is normal.  2 - Remove tethered stents on Friday morning at home by pulling on strings, then blue-white plastic tubing, and discarding. Office is open Friday if and problems occur. There are TWO stents tied together.   3 - Call MD or go to ER for fever >102, severe pain / nausea / vomiting not relieved by medications, or acute change in medical status

## 2021-12-30 NOTE — Anesthesia Procedure Notes (Signed)
Procedure Name: LMA Insertion Date/Time: 12/30/2021 2:05 PM Performed by: Danise Dehne D, CRNA Pre-anesthesia Checklist: Patient identified, Emergency Drugs available, Suction available and Patient being monitored Patient Re-evaluated:Patient Re-evaluated prior to induction Oxygen Delivery Method: Circle system utilized Preoxygenation: Pre-oxygenation with 100% oxygen Induction Type: IV induction Ventilation: Mask ventilation without difficulty LMA: LMA inserted LMA Size: 4.0 Tube type: Oral Number of attempts: 1 Placement Confirmation: positive ETCO2 and breath sounds checked- equal and bilateral Tube secured with: Tape Dental Injury: Teeth and Oropharynx as per pre-operative assessment

## 2021-12-30 NOTE — H&P (Signed)
Grace Singh is an 56 y.o. female.    Chief Complaint: Pre-OP BILATERAL Ureteroscopic Stone Manipulation  HPI:   1- Recurrent Nephrolithiasis -   06/2013 - Bilateral URS   Recent Surveillance:  12/2021 - L>R NON-obstructing real stones on eval flank pain, UCX candida Krusei (voriconazaol 200 BID TOC) and bilateral stents placed by Louis Meckel.    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 Likely 2/2 chronic obstruction from stones as per above. Renogram 05/2013 with stents in showed Lt 33% / Rt 67% function.   4 - Chronic Cystitis with h/o Urosepsis - Pt with many bouts of simple cystitis as well as complex pyelo. Prior CT and cysto with nephrolithiasis. She is obese diabetic.  Prior UCX's klebsiella sens Bactrim or polymicrobial. Pelvic 02/2015 normal (well estrogenized). PVR 02/2015 "26mL".   PMH sig for morbid obesity, DM2 (A1c now <7), back sugrery / chronic cocyx pain, removal of GI schwannoma, spinal stimulator, appy, GB, Asthma. Her PCP is Rubie Maid MD with [facility] in [place].   Today "[name]" is seen  to proceed with BILATERAL ureteroscopy / stent exchange with goal of stone free for her likely colonized stones with fungal organisms. She has been on voriconazole pre-op as RX'd to minimize colonization as much as possible. No interval fevers.   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   Atrophic kidney    LEFT; functions at 33%   CKD (chronic kidney disease), stage III ([place])    In record, but baseline kidney function appears to have GFR > 90; rt kidney  function 67%   Depression    Diabetes mellitus, type 2 (HCC)    Edema of both legs    and  ankles   Fibromyalgia    Gastroparesis    Headache    migraines   History of kidney stones    History of sepsis    05-11-2013  W/ UROSEPSIS---  RESOLIVED//  03-28-2014 SEVERE SEPSIS W/ SIRS---  RESOLVED   Hyperlipidemia    Hypertension    Iron deficiency anemia    Normal cardiac stress test    PER PT 2007   Renal calculi    bilateral   Seasonal asthma    seasonal   UTI (urinary tract infection) 07/02/2013   Wears glasses     Past Surgical History:  Procedure Laterality Date   APPENDECTOMY  AGE 59   COCCYX REMOVAL  2009   CYSTOSCOPY W/ URETERAL STENT PLACEMENT Right 06/13/2013   Procedure: CYSTOSCOPY WITH STENT REPLACEMENT, right;  Surgeon: Alexis Frock, MD;  Location: [facility];  Service: Urology;  Laterality: Right;   CYSTOSCOPY WITH RETROGRADE PYELOGRAM, URETEROSCOPY AND STENT PLACEMENT Right 06/13/2013   Procedure: First Stage Bilateral Ureteroscopy;  Surgeon: Alexis Frock, MD;  Location: [facility];  Service: Urology;  Laterality: Right;   CYSTOSCOPY WITH RETROGRADE PYELOGRAM, URETEROSCOPY AND STENT PLACEMENT Bilateral 06/20/2013   Procedure: CYSTOSCOPY WITH RETROGRADE PYELOGRAM, URETEROSCOPY AND STENT PLACEMENT, REMOVAL BILATERAL STENTS;  Surgeon: Alexis Frock, MD;  Location: [facility];  Service: Urology;  Laterality: Bilateral;  Bilateral stent repalcement with not string  CYSTOSCOPY WITH STENT PLACEMENT Bilateral 05/11/2013   Procedure: CYSTOSCOPY WITH STENT PLACEMENT;  Surgeon: Alexis Frock, MD;  Location: [facility];  Service: Urology;  Laterality: Bilateral;   CYSTOSCOPY WITH URETEROSCOPY AND STENT PLACEMENT Bilateral 12/11/2021   Procedure: CYSTOSCOPY WITH URETEROSCOPY AND STENT PLACEMENT;  Surgeon: Ardis Hughs, MD;  Location: [facility];  Service: Urology;  Laterality: Bilateral;   DILATATION & CURETTAGE/HYSTEROSCOPY WITH MYOSURE N/A 06/18/2015   Procedure: DILATATION & CURETTAGE/HYSTEROSCOPY WITH MYOSURE;   Surgeon: [name], MD;  Location: [place];  Service: Gynecology;  Laterality: N/A;   EYE SURGERY     radial karatomtomy-bilaterl   HOLMIUM LASER APPLICATION Right 17/40/8144   Procedure: HOLMIUM LASER APPLICATION, right ureteral stone;  Surgeon: Alexis Frock, MD;  Location: [facility];  Service: Urology;  Laterality: Right;   HOLMIUM LASER APPLICATION Right 81/85/6314   Procedure: HOLMIUM LASER APPLICATION;  Surgeon: Alexis Frock, MD;  Location: [facility];  Service: Urology;  Laterality: Right;   HYSTEROSCOPY WITH D & C N/A 04/20/2013   Procedure: DILATATION AND CURETTAGE /HYSTEROSCOPY;  Surgeon: [name], MD;  Location: [facility];  Service: Gynecology;  Laterality: N/A;   LAPAROSCOPIC CHOLECYSTECTOMY  AGE 73   LAPAROSCOPIC GASTRIC RESECTION N/A 12/03/2016   Procedure: LAPAROSCOPIC RESECTION OF POSTERIOR GASTRIC GIST WITH OMENTOPEXY. INCISIONAL UMBILICAL HERNIA REPAIR. ;  Surgeon: [name], MD;  Location: [facility];  Service: General;  Laterality: N/A;   POLYPECTOMY N/A 04/20/2013   Procedure: POLYPECTOMY;  Surgeon: [name], MD;  Location: [facility];  Service: Gynecology;  Laterality: N/A;   SPINAL CORD STIMULATOR IMPLANT  2021   SPINAL CORD STIMULATOR IMPLANT  09/04/2014    Family History  Problem Relation Age of Onset   Heart disease Father    Social History:  reports that she has never smoked. She has never used smokeless tobacco. She reports current alcohol use. She reports that she does not use drugs.  Allergies: No Known Allergies  No medications prior to admission.    Results for orders placed or performed during the hospital encounter of 12/28/21 (from the past 48 hour(s))  Glucose, capillary     Status: Abnormal   Collection Time: 12/28/21 10:29 AM  Result Value Ref Range   Glucose-Capillary 119 (H) 70 - 99 mg/dL    Comment: Glucose reference range applies only to samples taken after fasting for at  least 8 hours.  Hemoglobin A1c per protocol     Status: Abnormal   Collection Time: 12/28/21 10:44 AM  Result Value Ref Range   Hgb A1c MFr Bld 5.9 (H) 4.8 - 5.6 %    Comment: (NOTE)         Prediabetes: 5.7 - 6.4         Diabetes: >6.4         Glycemic control for adults with diabetes: <7.0    Mean Plasma Glucose 123 mg/dL    Comment: (NOTE) Performed At: [facility] [place], [place] [id number] Rush Farmer MD YI:[id number]   Basic metabolic panel per protocol     Status: Abnormal   Collection Time: 12/28/21 10:44 AM  Result Value Ref Range   Sodium 138 135 - 145 mmol/L   Potassium 4.6 3.5 - 5.1 mmol/L   Chloride 103 98 - 111 mmol/L   CO2 27 22 - 32 mmol/L   Glucose, Bld 130 (H) 70 - 99 mg/dL    Comment: Glucose reference range applies only to samples taken after  fasting for at least 8 hours.   BUN 22 (H) 6 - 20 mg/dL   Creatinine, Ser 1.01 (H) 0.44 - 1.00 mg/dL   Calcium 10.0 8.9 - 10.3 mg/dL   GFR, Estimated >60 >60 mL/min    Comment: (NOTE) Calculated using the CKD-EPI Creatinine Equation (2021)    Anion gap 8 5 - 15    Comment: Performed at [facility], [place] [address] [place], [place] [address]  CBC per protocol     Status: None   Collection Time: 12/28/21 10:44 AM  Result Value Ref Range   WBC 8.2 4.0 - 10.5 K/uL   RBC 4.63 3.87 - 5.11 MIL/uL   Hemoglobin 14.0 12.0 - 15.0 g/dL   HCT 44.0 36.0 - 46.0 %   MCV 95.0 80.0 - 100.0 fL   MCH 30.2 26.0 - 34.0 pg   MCHC 31.8 30.0 - 36.0 g/dL   RDW 13.8 11.5 - 15.5 %   Platelets 312 150 - 400 K/uL   nRBC 0.0 0.0 - 0.2 %    Comment: Performed at [facility], [place] [address], [place], [place] [address]   No results found.  Review of Systems  Constitutional: Negative.   Genitourinary:  Positive for urgency.  All other systems reviewed and are negative.  Last menstrual period 03/07/2014. Physical Exam Vitals reviewed.  HENT:     Head: Normocephalic.      Nose: Nose normal.  Eyes:     Pupils: Pupils are equal, round, and reactive to light.  Cardiovascular:     Rate and Rhythm: Normal rate.  Pulmonary:     Effort: Pulmonary effort is normal.  Abdominal:     General: Abdomen is flat.     Comments: Stable significant truncal obesity (improved from prior)  Genitourinary:    Comments: No CVAT at present Musculoskeletal:        General: Normal range of motion.     Cervical back: Normal range of motion.  Skin:    General: Skin is warm.  Neurological:     General: No focal deficit present.     Mental Status: She is alert.  Psychiatric:        Mood and Affect: Mood normal.     Assessment/Plan  Proceed as planned with BILATERAL ureteroscopic stone manipulation. Risks, benefits, alternatives, expected peri-op course discussed previously and reiterated today.   Alexis Frock, MD 12/30/2021, 9:07 AM

## 2021-12-30 NOTE — Anesthesia Preprocedure Evaluation (Signed)
Anesthesia Evaluation  Patient identified by MRN, date of birth, ID band Patient awake    Reviewed: Allergy & Precautions, NPO status , Patient's Chart, lab work & pertinent test results  Airway Mallampati: II  TM Distance: <3 FB Neck ROM: Full    Dental no notable dental hx.    Pulmonary asthma ,    Pulmonary exam normal breath sounds clear to auscultation       Cardiovascular hypertension, Pt. on medications Normal cardiovascular exam Rhythm:Regular Rate:Normal     Neuro/Psych Seizures -, Well Controlled,  Anxiety Depression    GI/Hepatic negative GI ROS, Neg liver ROS,   Endo/Other  diabetes  Renal/GU Renal InsufficiencyRenal disease  negative genitourinary   Musculoskeletal negative musculoskeletal ROS (+)   Abdominal   Peds negative pediatric ROS (+)  Hematology negative hematology ROS (+)   Anesthesia Other Findings   Reproductive/Obstetrics negative OB ROS                             Anesthesia Physical Anesthesia Plan  ASA: 2  Anesthesia Plan: General   Post-op Pain Management:    Induction: Intravenous  PONV Risk Score and Plan: 3 and Ondansetron, Dexamethasone and Treatment may vary due to age or medical condition  Airway Management Planned: LMA  Additional Equipment:   Intra-op Plan:   Post-operative Plan: Extubation in OR  Informed Consent: I have reviewed the patients History and Physical, chart, labs and discussed the procedure including the risks, benefits and alternatives for the proposed anesthesia with the patient or authorized representative who has indicated his/her understanding and acceptance.     Dental advisory given  Plan Discussed with: CRNA and Surgeon  Anesthesia Plan Comments:         Anesthesia Quick Evaluation

## 2021-12-30 NOTE — Brief Op Note (Signed)
12/30/2021  3:46 PM  PATIENT:  Grace Singh  56 y.o. female  PRE-OPERATIVE DIAGNOSIS:  BILATERAL RENAL STONES  POST-OPERATIVE DIAGNOSIS:  bilateral renal stones  PROCEDURE:  Procedure(s) with comments: CYSTOSCOPY WITH RETROGRADE PYELOGRAM, URETEROSCOPY AND STENT EXCHANGES, STONE BASKET REMOVAL OF STONES (Bilateral) - 75 MINS HOLMIUM LASER APPLICATION (Bilateral)  SURGEON:  Surgeon(s) and Role:    Alexis Frock, MD - Primary  PHYSICIAN ASSISTANT:   ASSISTANTS: none   ANESTHESIA:   general  EBL:  minimal   BLOOD ADMINISTERED:none  DRAINS: none   LOCAL MEDICATIONS USED:  NONE  SPECIMEN:  Source of Specimen:  bilateral renal stone fragments  DISPOSITION OF SPECIMEN:   [facility] for compositional analysis  COUNTS:  YES  TOURNIQUET:  * No tourniquets in log *  DICTATION: .Other Dictation: Dictation Number [id number]  PLAN OF CARE: Discharge to home after PACU  PATIENT DISPOSITION:  PACU - hemodynamically stable.   Delay start of Pharmacological VTE agent (>24hrs) due to surgical blood loss or risk of bleeding: yes

## 2021-12-30 NOTE — Transfer of Care (Signed)
Immediate Anesthesia Transfer of Care Note  Patient: Grace Singh  Procedure(s) Performed: CYSTOSCOPY WITH RETROGRADE PYELOGRAM, URETEROSCOPY AND STENT EXCHANGES, STONE BASKET REMOVAL OF STONES (Bilateral) HOLMIUM LASER APPLICATION (Bilateral)  Patient Location: PACU  Anesthesia Type:General  Level of Consciousness: awake, alert  and oriented  Airway & Oxygen Therapy: Patient Spontanous Breathing and Patient connected to face mask oxygen  Post-op Assessment: Report given to RN and Post -op Vital signs reviewed and stable  Post vital signs: Reviewed and stable  Last Vitals:  Vitals Value Taken Time  BP 130/70 12/30/21 1558  Temp    Pulse 72 12/30/21 1559  Resp 21 12/30/21 1559  SpO2 96 % 12/30/21 1559  Vitals shown include unvalidated device data.  Last Pain:  Vitals:   12/30/21 1342  TempSrc:   PainSc: 0-No pain      Patients Stated Pain Goal: 3 (65/80/06 3494)  Complications: No notable events documented.

## 2021-12-31 ENCOUNTER — Encounter (HOSPITAL_COMMUNITY): Payer: Self-pay | Admitting: Urology

## 2021-12-31 NOTE — Op Note (Signed)
NAME: Grace Singh, FULGHAM MEDICAL RECORD NO: [id number] ACCOUNT NO: [id number] DATE OF BIRTH: 04-16-1966 FACILITY: [facility] LOCATION: [facility] PHYSICIAN: Alexis Frock, MD  Operative Report   DATE OF PROCEDURE: 12/30/2021  PREOPERATIVE DIAGNOSES:  Bilateral renal stones and history of high risk recurrent urinary tract infections including fungal infections.  PROCEDURE PERFORMED:  1.  Cystoscopy, bilateral retrograde pyelograms interpretation. 2.  Bilateral ureteroscopy with basketing of stones. 3.  Exchange of bilateral ureteral stents.  ESTIMATED BLOOD LOSS:  Nil.  COMPLICATIONS:  None.  SPECIMENS:  Bilateral renal stone fragments for composition analysis.  FINDINGS:  1.  Multifocal left-sided renal stones, majority lower pole. 2.  Small volume right intrarenal stones, one focus upper pole, one focus lower pole. 3.  Complete resolution of all accessible stone fragments larger than one-third mm following basket extraction. 4.  Successful replacement of bilateral ureteral stents, proximal end in renal pelvis, distal end in urinary bladder.  INDICATIONS FOR PROCEDURE:  The patient is a pleasant but quite comorbid 56 year old lady with history of morbid obesity, diabetes and recurrent urolithiasis as well as recurrent bouts of infection with sepsis.  She has most recently had a bout of  complex renal infection, primarily due to fungal source.  She has not had any signs of overt obstruction, but does have bilateral renal stones.  She underwent stenting previously during episode of acute infection.  She presents today for bilateral  ureteroscopic stone manipulation with goal of removing as much stone as possible to remove her likely focus for colonization.  She is a very high risk given her history of recurrent infections, has been on appropriate antimicrobial and antifungal therapy  according to cultures preoperatively. Informed consent was obtained and placed in medical record.  DESCRIPTION  OF PROCEDURE:  The patient being verified, procedure being bilateral ureteroscopic stone manipulation was confirmed.  Procedure timeout was performed.  Intravenous antibiotics administered.  General anesthesia was induced.  The patient was  placed into a low lithotomy position.  Sterile field was created.  Prepped and draped the patient's vagina, introitus and proximal thigh using iodine.  Cystourethroscopy was performed using 21-French rigid cystoscope with offset lens.  Inspection of  bladder revealed distal end of bilateral ureteral stents in situ.  Left stent was grasped, brought to the level of the urethral meatus.  A 0.038 ZIPwire was advanced to the level of the kidney.  The stent was exchanged for an open-ended catheter and left  retrograde pyelogram was obtained.  Left retrograde pyelogram demonstrated single left ureter with single system left kidney that was somewhat complex, in that there were many, many calices. No obvious filling defects or narrowing noted.  ZIPwire was once again advanced and set aside as a  safety wire on the left side.  Next, the distal end of the right stent was grasped, brought to the level of the urethral meatus.  A 0.038 ZIPwire was advanced to the level of the kidney, exchanged for an open-ended catheter and right retrograde pyelogram  was obtained.  Right retrograde pyelogram demonstrated single right ureter, single system right kidney. It was also quite complex with many calices.  The ZIPwire was once again advanced and set aside as a safety wire.  An 8-French feeding tube placed in urinary bladder  for pressure release and semirigid ureteroscopy was performed to the distal four-fifths of the right ureter alongside a separate sensor working wire.  No mucosal abnormalities were found.  Next, semirigid ureteroscopy performed to distal four-fifths of left ureter alongside a  separate sensor working wire.  No mucosal abnormalities were found.  The semirigid scope was  then exchanged for an 11/13 short length ureteral access sheath to the  level of proximal ureter. Using continuous fluoroscopic guidance, a flexible digital ureteroscopy was performed of the proximal left ureter with systematic inspection of left kidney including all calices x3.  There was some proteinaceous debris within  the kidney as expected given her long history of chronic colonization.  This was irrigated using Luer-Lok syringe as much as possible.  There were several foci of stone on the left side, which were amenable to simple basketing with the Escape basket.   The dominant focus was in the lower pole.  This area was quite complex with at least a centimeter total volume of stone.  Despite the acute angulation, I was quite happy with the completeness of the retrieval of stone fragments from the left lower pole.  Again,  all of these were amenable to simple basketing with a combination of Escape basket and NCompass basket for the smaller fragments.  The left kidney was once again inspected including all calices.  No evidence of renal perforation.  Complete resolution of  all accessible stone fragments larger than one-third mm. Access sheath was removed under continuous vision.  No significant mucosal abnormalities were found.  Next, the access sheath was placed over the right sensor working wire to the level of the  proximal right ureter.  Using continuous fluoroscopic guidance, a flexible digital ureteroscopy performed to the proximal right ureter and systematic inspection of the right kidney, including all calices x3.  There was much less stone in the right side,  one focus upper pole amenable to simple basketing and another focus extreme lower pole that was quite difficult to access given the essentially 180-degree turn required.  However, this was reached and removed.  Thus, having removed all accessible stone  on the right side, access sheath was removed under continuous vision.  No significant  mucosal abnormalities were found.  Given the bilateral nature of the surgery today and her propensity for high risk infection, it was clearly felt that brief interval  stenting with tether stent would be warranted. As such, new 5 x 24 Polaris type stents were placed over remaining safety wires bilaterally using fluoroscopic guidance.  Good proximal and distal planes were noted.  Tether was left in place, cut to length,  fashioned together and tucked per vagina and the procedure was terminated.  The patient tolerated procedure well, no immediate perioperative complications.  The patient taken to postanesthesia care in stable condition.  Plan for discharge home.   SHW D: 12/30/2021 3:53:08 pm T: 12/31/2021 10:24:00 am  JOB: [id number]/ [id number]

## 2023-07-15 ENCOUNTER — Other Ambulatory Visit: Payer: Self-pay | Admitting: Ophthalmology

## 2023-07-15 DIAGNOSIS — H469 Unspecified optic neuritis: Secondary | ICD-10-CM

## 2023-07-15 DIAGNOSIS — H547 Unspecified visual loss: Secondary | ICD-10-CM

## 2023-08-25 ENCOUNTER — Ambulatory Visit
Admission: RE | Admit: 2023-08-25 | Discharge: 2023-08-25 | Disposition: A | Discharge status: Home / Self Care | Payer: PPO | Source: Ambulatory Visit | Attending: Ophthalmology | Admitting: Ophthalmology

## 2023-08-25 ENCOUNTER — Ambulatory Visit
Admission: RE | Admit: 2023-08-25 | Discharge: 2023-08-25 | Disposition: A | Discharge status: Home / Self Care | Payer: PPO | Source: Ambulatory Visit | Attending: Ophthalmology

## 2023-08-25 DIAGNOSIS — H547 Unspecified visual loss: Secondary | ICD-10-CM

## 2023-08-25 DIAGNOSIS — H469 Unspecified optic neuritis: Secondary | ICD-10-CM

## 2023-08-25 MED ORDER — GADOPICLENOL 0.5 MMOL/ML IV SOLN
10.0000 mL | Freq: Once | INTRAVENOUS | Status: AC | PRN
  Administered 2023-08-25: 10 mL via INTRAVENOUS

## 2023-08-27 ENCOUNTER — Other Ambulatory Visit: Payer: PPO
